# Patient Record
Sex: Male | Born: 1985 | Hispanic: No | Marital: Single | Smoking: Never smoker
Health system: Southern US, Community
[De-identification: ages and names within clinical notes are randomized; demographics above are authoritative.]

---

## 2013-04-27 ENCOUNTER — Encounter (HOSPITAL_BASED_OUTPATIENT_CLINIC_OR_DEPARTMENT_OTHER): Payer: Self-pay | Admitting: *Deleted

## 2013-04-27 ENCOUNTER — Emergency Department (HOSPITAL_BASED_OUTPATIENT_CLINIC_OR_DEPARTMENT_OTHER)
Admission: EM | Admit: 2013-04-27 | Discharge: 2013-04-27 | Disposition: A | Discharge status: Home / Self Care | Payer: Self-pay | Attending: Emergency Medicine | Admitting: Emergency Medicine

## 2013-04-27 DIAGNOSIS — Y9389 Activity, other specified: Secondary | ICD-10-CM | POA: Insufficient documentation

## 2013-04-27 DIAGNOSIS — Y990 Civilian activity done for income or pay: Secondary | ICD-10-CM | POA: Insufficient documentation

## 2013-04-27 DIAGNOSIS — Y929 Unspecified place or not applicable: Secondary | ICD-10-CM | POA: Insufficient documentation

## 2013-04-27 DIAGNOSIS — S39012A Strain of muscle, fascia and tendon of lower back, initial encounter: Secondary | ICD-10-CM

## 2013-04-27 DIAGNOSIS — X500XXA Overexertion from strenuous movement or load, initial encounter: Secondary | ICD-10-CM | POA: Insufficient documentation

## 2013-04-27 DIAGNOSIS — S335XXA Sprain of ligaments of lumbar spine, initial encounter: Secondary | ICD-10-CM | POA: Insufficient documentation

## 2013-04-27 MED ORDER — IBUPROFEN 600 MG PO TABS: 600.0000 mg | ORAL_TABLET | Freq: Four times a day (QID) | ORAL | Status: DC | PRN

## 2013-04-27 NOTE — ED Provider Notes (Signed)
CSN: [id number]     Arrival date & time 04/27/13  1315 History     First MD Initiated Contact with Patient 04/27/13 1325     Chief Complaint  Patient presents with  . Back Pain   (Consider location/radiation/quality/duration/timing/severity/associated sxs/prior Treatment) HPI Comments: Patient was lifting heavy palates at work 2 days ago prior to onset of back pain. Relief with Advil and rest as well as topical ice. Denies incontinence, numbness/tingling, and extremity weakness.  Patient is a 27 y.o. male presenting with back pain. The history is provided by the patient. No language interpreter was used.  Back Pain Location:  Lumbar spine Quality:  Aching Radiates to: down to lumbar spine. Pain severity:  Moderate Worse during: worse with movement. Onset quality:  Gradual Duration:  2 days Timing:  Intermittent Progression:  Unchanged Chronicity:  New Context: lifting heavy objects   Context: not jumping from heights   Relieved by:  NSAIDs Worsened by:  Movement and palpation Associated symptoms: no bladder incontinence, no bowel incontinence, no fever, no leg pain, no numbness, no paresthesias, no perianal numbness, no tingling and no weakness   Risk factors: no hx of cancer     History reviewed. No pertinent past medical history. History reviewed. No pertinent past surgical history. No family history on file. History  Substance Use Topics  . Smoking status: Not on file  . Smokeless tobacco: Never Used  . Alcohol Use: No    Review of Systems  Constitutional: Negative for fever.  Gastrointestinal: Negative for bowel incontinence.  Genitourinary: Negative for bladder incontinence.  Musculoskeletal: Positive for back pain.  Skin: Negative for pallor and wound.  Neurological: Negative for tingling, weakness, numbness and paresthesias.  All other systems reviewed and are negative.    Allergies  Review of patient's allergies indicates no known allergies.  Home  Medications   Current Outpatient Rx  Name  Route  Sig  Dispense  Refill  . ibuprofen (ADVIL,MOTRIN) 600 MG tablet   Oral   Take 1 tablet (600 mg total) by mouth every 6 (six) hours as needed for pain.   30 tablet   0    BP 150/101  Pulse 80  Temp(Src) 98.2 F (36.8 C) (Oral)  Resp 15  Ht 5\' 9"  (1.753 m)  Wt 203 lb (92.08 kg)  BMI 29.96 kg/m2  SpO2 99%  Physical Exam  Nursing note and vitals reviewed. Constitutional: He is oriented to person, place, and time. He appears well-developed and well-nourished. No distress.  HENT:  Head: Normocephalic and atraumatic.  Eyes: Conjunctivae and EOM are normal. No scleral icterus.  Neck: Normal range of motion.  Cardiovascular: Normal rate, regular rhythm and intact distal pulses.   DP and PT pulses 2+ bilaterally  Pulmonary/Chest: Effort normal. No respiratory distress.  Musculoskeletal:  Tenderness to palpation of the lumbar paraspinal muscles. No midline tenderness to palpation of the thoracic or lumbar spine. No bony deformities or step-offs palpated. Negative straight leg raise and cross straight leg raise. Patient ambulatory with normal gait.  Neurological: He is alert and oriented to person, place, and time.  No sensory or motor deficits appreciated. DTRs normal and symmetric. Patient moves extremities without ataxia.  Skin: Skin is warm and dry. No rash noted. He is not diaphoretic. No erythema. No pallor.  Psychiatric: He has a normal mood and affect. His behavior is normal.    ED Course   Procedures (including critical care time)  Labs Reviewed - No data to display No results  found.  1. Back strain, initial encounter    MDM  27 year old male who presents for back pain with onset 2 days ago after heavy lifting at his job. Patient with tenderness to palpation of his left and right lumbar paraspinal muscles. No midline tenderness to palpation, bony deformities, or step-offs. No red flags or signs concerning for cauda  equina. Patient ambulatory and neurovascularly intact. Appropriate for discharge with instruction for Advil, ice, and rest for symptomatic improvement. Orthopedic referral provided if no improvement in back pain after one week. Indications for ED return discussed and patient agreeable to plan.    Antony Madura, PA-C 04/27/13 1414

## 2013-04-27 NOTE — ED Notes (Signed)
Pt. Reports lifting heavy palates at his job and now having low back pain 8/10 per Pt.  Pt. Reports bending to tie his shoe is a task and painful

## 2013-04-27 NOTE — ED Provider Notes (Signed)
Medical screening examination/treatment/procedure(s) were performed by non-physician practitioner and as supervising physician I was immediately available for consultation/collaboration.   Richardean Canal, MD 04/27/13 [phone]

## 2020-05-11 ENCOUNTER — Emergency Department (HOSPITAL_COMMUNITY)
Admission: EM | Admit: 2020-05-11 | Discharge: 2020-05-11 | Disposition: A | Discharge status: Home / Self Care | Payer: Self-pay | Attending: Emergency Medicine | Admitting: Emergency Medicine

## 2020-05-11 ENCOUNTER — Encounter (HOSPITAL_COMMUNITY): Payer: Self-pay

## 2020-05-11 ENCOUNTER — Other Ambulatory Visit: Payer: Self-pay

## 2020-05-11 DIAGNOSIS — R000 Tachycardia, unspecified: Secondary | ICD-10-CM | POA: Insufficient documentation

## 2020-05-11 DIAGNOSIS — E860 Dehydration: Secondary | ICD-10-CM | POA: Insufficient documentation

## 2020-05-11 DIAGNOSIS — U071 COVID-19: Secondary | ICD-10-CM | POA: Insufficient documentation

## 2020-05-11 LAB — SARS CORONAVIRUS 2 BY RT PCR (HOSPITAL ORDER, PERFORMED IN ~~LOC~~ HOSPITAL LAB): SARS Coronavirus 2: POSITIVE — AB

## 2020-05-11 MED ORDER — BENZONATATE 100 MG PO CAPS
100.0000 mg | ORAL_CAPSULE | Freq: Three times a day (TID) | ORAL | 0 refills | Status: DC
Start: 2020-05-11 — End: 2020-05-11

## 2020-05-11 MED ORDER — PROMETHAZINE-DM 6.25-15 MG/5ML PO SYRP: 5.0000 mL | ORAL_SOLUTION | Freq: Four times a day (QID) | ORAL | 0 refills | Status: DC | PRN

## 2020-05-11 MED ORDER — BENZONATATE 100 MG PO CAPS: 100.0000 mg | ORAL_CAPSULE | Freq: Three times a day (TID) | ORAL | 0 refills | Status: DC

## 2020-05-11 MED ORDER — ONDANSETRON 4 MG PO TBDP: 4.0000 mg | ORAL_TABLET | Freq: Three times a day (TID) | ORAL | 0 refills | Status: DC | PRN

## 2020-05-11 MED ORDER — PROMETHAZINE-DM 6.25-15 MG/5ML PO SYRP: 5.0000 mL | ORAL_SOLUTION | Freq: Four times a day (QID) | ORAL | 0 refills | Status: AC | PRN

## 2020-05-11 MED ORDER — IBUPROFEN 200 MG PO TABS
400.0000 mg | ORAL_TABLET | Freq: Once | ORAL | Status: AC
  Administered 2020-05-11: 400 mg via ORAL
  Filled 2020-05-11: qty 2

## 2020-05-11 MED ORDER — ONDANSETRON 4 MG PO TBDP: 4.0000 mg | ORAL_TABLET | Freq: Three times a day (TID) | ORAL | 0 refills | Status: AC | PRN

## 2020-05-11 MED ORDER — BENZONATATE 100 MG PO CAPS: 100.0000 mg | ORAL_CAPSULE | Freq: Three times a day (TID) | ORAL | 0 refills | Status: AC

## 2020-05-11 MED ORDER — ONDANSETRON 8 MG PO TBDP
8.0000 mg | ORAL_TABLET | Freq: Once | ORAL | Status: AC
  Administered 2020-05-11: 8 mg via ORAL
  Filled 2020-05-11: qty 1

## 2020-05-11 MED ORDER — ACETAMINOPHEN 500 MG PO TABS
1000.0000 mg | ORAL_TABLET | Freq: Once | ORAL | Status: AC
  Administered 2020-05-11: 1000 mg via ORAL
  Filled 2020-05-11: qty 2

## 2020-05-11 NOTE — Discharge Instructions (Addendum)
I strong suspicion that you have a infection of COVID-19.  I provided you with Zofran which is nausea medication may also help with the diarrhea you are having.  Please drink plenty of water and may also use some of the electrolyte solutions that you and I discussed.  Please use Tylenol 1000 mg every 6 hours you may alternate with ibuprofen 400-600 mg as we discussed.  Please follow CDC guidelines and isolate at home.  Wear a mask at all times and wash your hands thoroughly.  May return to the emergency department for any new or concerning symptoms.  Or worsening shortness of breath.  Your COVID test is pending; you should expect results in 2-3 days. You can access your results on your MyChart--if you test positive you should receive a phone call.  In the meantime follow CDC guidelines and quarantine, wear a mask, wash hands often.   Please take over the counter vitamin D 2000-4000 units per day. I also recommend zinc 50 mg per day for the next two weeks.   Please return to ED if you feel have difficulty breathing or have emergent, new or concerning symptoms.  Patients who have symptoms consistent with COVID-19 should self isolated for: At least 3 days (72 hours) have passed since recovery, defined as resolution of fever without the use of fever reducing medications and improvement in respiratory symptoms (e.g., cough, shortness of breath), and At least 7 days have passed since symptoms first appeared.       Person Under Monitoring Name: Brian Mack  Location: [address] [place] [address]   Infection Prevention Recommendations for Individuals Confirmed to have, or Being Evaluated for, 2019 Novel Coronavirus (COVID-19) Infection Who Receive Care at Home  Individuals who are confirmed to have, or are being evaluated for, COVID-19 should follow the prevention steps below until a healthcare provider or local or state health department says they can return to  normal activities.  Stay home except to get medical care You should restrict activities outside your home, except for getting medical care. Do not go to work, school, or public areas, and do not use public transportation or taxis.  Call ahead before visiting your doctor Before your medical appointment, call the healthcare provider and tell them that you have, or are being evaluated for, COVID-19 infection. This will help the healthcare provider's office take steps to keep other people from getting infected. Ask your healthcare provider to call the local or state health department.  Monitor your symptoms Seek prompt medical attention if your illness is worsening (e.g., difficulty breathing). Before going to your medical appointment, call the healthcare provider and tell them that you have, or are being evaluated for, COVID-19 infection. Ask your healthcare provider to call the local or state health department.  Wear a facemask You should wear a facemask that covers your nose and mouth when you are in the same room with other people and when you visit a healthcare provider. People who live with or visit you should also wear a facemask while they are in the same room with you.  Separate yourself from other people in your home As much as possible, you should stay in a different room from other people in your home. Also, you should use a separate bathroom, if available.  Avoid sharing household items You should not share dishes, drinking glasses, cups, eating utensils, towels, bedding, or other items with other people in your home. After using these items, you  should wash them thoroughly with soap and water.  Cover your coughs and sneezes Cover your mouth and nose with a tissue when you cough or sneeze, or you can cough or sneeze into your sleeve. Throw used tissues in a lined trash can, and immediately wash your hands with soap and water for at least 20 seconds or use an alcohol-based  hand rub.  Wash your [employer] your hands often and thoroughly with soap and water for at least 20 seconds. You can use an alcohol-based hand sanitizer if soap and water are not available and if your hands are not visibly dirty. Avoid touching your eyes, nose, and mouth with unwashed hands.   Prevention Steps for Caregivers and Household Members of Individuals Confirmed to have, or Being Evaluated for, COVID-19 Infection Being Cared for in the Home  If you live with, or provide care at home for, a person confirmed to have, or being evaluated for, COVID-19 infection please follow these guidelines to prevent infection:  Follow healthcare provider's instructions Make sure that you understand and can help the patient follow any healthcare provider instructions for all care.  Provide for the patient's basic needs You should help the patient with basic needs in the home and provide support for getting groceries, prescriptions, and other personal needs.  Monitor the patient's symptoms If they are getting sicker, call his or her medical provider and tell them that the patient has, or is being evaluated for, COVID-19 infection. This will help the healthcare provider's office take steps to keep other people from getting infected. Ask the healthcare provider to call the local or state health department.  Limit the number of people who have contact with the patient If possible, have only one caregiver for the patient. Other household members should stay in another home or place of residence. If this is not possible, they should stay in another room, or be separated from the patient as much as possible. Use a separate bathroom, if available. Restrict visitors who do not have an essential need to be in the home.  Keep older adults, very young children, and other sick people away from the patient Keep older adults, very young children, and those who have compromised immune systems or chronic  health conditions away from the patient. This includes people with chronic heart, lung, or kidney conditions, diabetes, and cancer.  Ensure good ventilation Make sure that shared spaces in the home have good air flow, such as from an air conditioner or an opened window, weather permitting.  Wash your hands often Wash your hands often and thoroughly with soap and water for at least 20 seconds. You can use an alcohol based hand sanitizer if soap and water are not available and if your hands are not visibly dirty. Avoid touching your eyes, nose, and mouth with unwashed hands. Use disposable paper towels to dry your hands. If not available, use dedicated cloth towels and replace them when they become wet.  Wear a facemask and gloves Wear a disposable facemask at all times in the room and gloves when you touch or have contact with the patient's blood, body fluids, and/or secretions or excretions, such as sweat, saliva, sputum, nasal mucus, vomit, urine, or feces.  Ensure the mask fits over your nose and mouth tightly, and do not touch it during use. Throw out disposable facemasks and gloves after using them. Do not reuse. Wash your hands immediately after removing your facemask and gloves. If your personal clothing becomes contaminated, carefully  remove clothing and launder. Wash your hands after handling contaminated clothing. Place all used disposable facemasks, gloves, and other waste in a lined container before disposing them with other household waste. Remove gloves and wash your hands immediately after handling these items.  Do not share dishes, glasses, or other household items with the patient Avoid sharing household items. You should not share dishes, drinking glasses, cups, eating utensils, towels, bedding, or other items with a patient who is confirmed to have, or being evaluated for, COVID-19 infection. After the person uses these items, you should wash them thoroughly with soap and  water.  Wash laundry thoroughly Immediately remove and wash clothes or bedding that have blood, body fluids, and/or secretions or excretions, such as sweat, saliva, sputum, nasal mucus, vomit, urine, or feces, on them. Wear gloves when handling laundry from the patient. Read and follow directions on labels of laundry or clothing items and detergent. In general, wash and dry with the warmest temperatures recommended on the label.  Clean all areas the individual has used often Clean all touchable surfaces, such as counters, tabletops, doorknobs, bathroom fixtures, toilets, phones, keyboards, tablets, and bedside tables, every day. Also, clean any surfaces that may have blood, body fluids, and/or secretions or excretions on them. Wear gloves when cleaning surfaces the patient has come in contact with. Use a diluted bleach solution (e.g., dilute bleach with 1 part bleach and 10 parts water) or a household disinfectant with a label that says EPA-registered for coronaviruses. To make a bleach solution at home, add 1 tablespoon of bleach to 1 quart (4 cups) of water. For a larger supply, add  cup of bleach to 1 gallon (16 cups) of water. Read labels of cleaning products and follow recommendations provided on product labels. Labels contain instructions for safe and effective use of the cleaning product including precautions you should take when applying the product, such as wearing gloves or eye protection and making sure you have good ventilation during use of the product. Remove gloves and wash hands immediately after cleaning.  Monitor yourself for signs and symptoms of illness Caregivers and household members are considered close contacts, should monitor their health, and will be asked to limit movement outside of the home to the extent possible. Follow the monitoring steps for close contacts listed on the symptom monitoring form.   ? If you have additional questions, contact your local health  department or call the epidemiologist on call at [phone] (available 24/7). ? This guidance is subject to change. For the most up-to-date guidance from [facility], please refer to their website: [email or web address]

## 2020-05-11 NOTE — ED Notes (Signed)
Patient ambulated with this RN, Spo2 remained at 93%

## 2020-05-11 NOTE — ED Provider Notes (Signed)
[facility]-EMERGENCY DEPT Provider Note   CSN: [id number] Arrival date & time: 05/11/20  1436     History Chief Complaint  Patient presents with  . COVID+  . Emesis  . Diarrhea    Brian Mack is a 34 y.o. male.  HPI  Patient is a 34 year old male with no pertinent past medical history presented today with symptoms for 4 days that consist of congestion, cough, nausea, 2 episodes of nonbloody diarrhea.  Chills but denies fever.  He has no known Covid exposure however he did a home Covid test that was positive.  He has not been seen medically for the symptoms yet.  He states he feels no shortness of breath denies any chest pain or abdominal pain.  Denies any vomiting.  Patient states he has not taken anything for his symptoms prior to arrival.  He has been eating and drinking normally.  He states he has had no calf pain or shortness of breath.  No history of VTE.  He is on no blood thinners.  Denies any medical issues.  He states that he is looking for symptomatic treatment at this time has not interested in having x-rays or blood work done    History reviewed. No pertinent past medical history.  There are no problems to display for this patient.   History reviewed. No pertinent surgical history.     History reviewed. No pertinent family history.  Social History   Tobacco Use  . Smoking status: Not on file  . Smokeless tobacco: Never Used  Substance Use Topics  . Alcohol use: No  . Drug use: Not on file    Home Medications Prior to Admission medications   Medication Sig Start Date End Date Taking? Authorizing Provider  benzonatate (TESSALON) 100 MG capsule Take 1 capsule (100 mg total) by mouth every 8 (eight) hours. 05/11/20   Gailen Shelter, PA  ibuprofen (ADVIL,MOTRIN) 600 MG tablet Take 1 tablet (600 mg total) by mouth every 6 (six) hours as needed for pain. 04/27/13   Antony Madura, PA-C  ondansetron (ZOFRAN ODT) 4 MG disintegrating  tablet Take 1 tablet (4 mg total) by mouth every 8 (eight) hours as needed for nausea or vomiting. 05/11/20   Gailen Shelter, PA  promethazine-dextromethorphan (PROMETHAZINE-DM) 6.25-15 MG/5ML syrup Take 5 mLs by mouth 4 (four) times daily as needed for cough. 05/11/20   Gailen Shelter, PA    Allergies    Patient has no known allergies.  Review of Systems   Review of Systems  Constitutional: Positive for chills and fatigue. Negative for fever.  HENT: Positive for congestion.   Eyes: Negative for pain.  Respiratory: Positive for cough. Negative for shortness of breath.   Cardiovascular: Negative for chest pain and leg swelling.  Gastrointestinal: Positive for diarrhea and nausea. Negative for abdominal pain and vomiting.  Genitourinary: Negative for dysuria.  Musculoskeletal: Positive for myalgias.  Skin: Negative for rash.  Neurological: Negative for dizziness and headaches.    Physical Exam Updated Vital Signs BP 122/85   Pulse 99   Temp 99.1 F (37.3 C) (Oral)   Resp 18   Ht 5' 9.5" (1.765 m)   Wt 117.9 kg   SpO2 98%   BMI 37.84 kg/m   Physical Exam Vitals and nursing note reviewed.  Constitutional:      General: He is not in acute distress. HENT:     Head: Normocephalic and atraumatic.     Nose: Nose normal.  Mouth/Throat:     Mouth: Mucous membranes are moist.  Eyes:     General: No scleral icterus. Cardiovascular:     Rate and Rhythm: Regular rhythm. Tachycardia present.     Pulses: Normal pulses.     Heart sounds: Normal heart sounds.     Comments: Heart rate 106-108 Pulmonary:     Effort: Pulmonary effort is normal. No respiratory distress.     Breath sounds: No wheezing.     Comments: Coarse lung sounds, no crackles. No tachypnea.  No increased work of breathing.  Speaking full sentences. Abdominal:     Palpations: Abdomen is soft.     Tenderness: There is no abdominal tenderness. There is no guarding or rebound.  Musculoskeletal:     Cervical  back: Normal range of motion.     Right lower leg: No edema.     Left lower leg: No edema.     Comments: No calf tenderness bilaterally.  No swelling or edema.  Skin:    General: Skin is warm and dry.     Capillary Refill: Capillary refill takes less than 2 seconds.  Neurological:     Mental Status: He is alert. Mental status is at baseline.  Psychiatric:        Mood and Affect: Mood normal.        Behavior: Behavior normal.     ED Results / Procedures / Treatments   Labs (all labs ordered are listed, but only abnormal results are displayed) Labs Reviewed  SARS CORONAVIRUS 2 BY RT PCR (HOSPITAL ORDER, PERFORMED IN [facility]) - Abnormal; Notable for the following components:      Result Value   SARS Coronavirus 2 POSITIVE (*)    All other components within normal limits    EKG None  Radiology No results found.  Procedures Procedures (including critical care time)  Medications Ordered in ED Medications  ondansetron (ZOFRAN-ODT) disintegrating tablet 8 mg (8 mg Oral Given 05/11/20 1558)  acetaminophen (TYLENOL) tablet 1,000 mg (1,000 mg Oral Given 05/11/20 1558)  ibuprofen (ADVIL) tablet 400 mg (400 mg Oral Given 05/11/20 1558)    ED Course  I have reviewed the triage vital signs and the nursing notes.  Pertinent labs & imaging results that were available during my care of the patient were reviewed by me and considered in my medical decision making (see chart for details).    MDM Rules/Calculators/A&P                          Patient is a 34 year old male with no pertinent medical history presented today with symptoms consistent with COVID-19.  He has not had a formal test.  He did have a Covid home test which was positive. He denies any shortness of breath or chest pain.  Rest of his symptoms are consistent with COVID-19 infection.  He was ambulated on pulse ox with no desaturation.  He is tolerating p.o. has had some Zofran and Tylenol and  ibuprofen.  Physical exam is notable for coarse lung sounds but no other acute abnormalities.  He is aerating well mildly tachycardic with heart rate of 108.  Will provide patient with fluid challenge and medicines as noted above.  Will reassess.  I reassessed patient after 40 minutes.  His vital signs have improved and he is feeling significantly improved. He continues to decline medical work-up at this time.  I gave patient strict return precautions and discharged with conservative  therapy with Zofran, cough medicines and Tylenol and ibuprofen recommendations.   Brian Mack was evaluated in Emergency Department on 05/11/2020 for the symptoms described in the history of present illness. He was evaluated in the context of the global COVID-19 pandemic, which necessitated consideration that the patient might be at risk for infection with the SARS-CoV-2 virus that causes COVID-19. Institutional protocols and algorithms that pertain to the evaluation of patients at risk for COVID-19 are in a state of rapid change based on information released by regulatory bodies including the CDC and federal and state organizations. These policies and algorithms were followed during the patient's care in the ED.  Final Clinical Impression(s) / ED Diagnoses Final diagnoses:  COVID-19  Dehydration    Rx / DC Orders ED Discharge Orders         Ordered    ondansetron (ZOFRAN ODT) 4 MG disintegrating tablet  Every 8 hours PRN,   Status:  Discontinued        05/11/20 1727    promethazine-dextromethorphan (PROMETHAZINE-DM) 6.25-15 MG/5ML syrup  4 times daily PRN,   Status:  Discontinued        05/11/20 1727    benzonatate (TESSALON) 100 MG capsule  Every 8 hours,   Status:  Discontinued        05/11/20 1727    benzonatate (TESSALON) 100 MG capsule  Every 8 hours,   Status:  Discontinued        05/11/20 1736    ondansetron (ZOFRAN ODT) 4 MG disintegrating tablet  Every 8 hours PRN,   Status:  Discontinued         05/11/20 1736    promethazine-dextromethorphan (PROMETHAZINE-DM) 6.25-15 MG/5ML syrup  4 times daily PRN,   Status:  Discontinued        05/11/20 1736    benzonatate (TESSALON) 100 MG capsule  Every 8 hours        05/11/20 1743    ondansetron (ZOFRAN ODT) 4 MG disintegrating tablet  Every 8 hours PRN        05/11/20 1743    promethazine-dextromethorphan (PROMETHAZINE-DM) 6.25-15 MG/5ML syrup  4 times daily PRN        05/11/20 1743           Gailen Shelter, [place] 05/11/20 1748    Gerhard Munch, MD 05/11/20 2244

## 2020-05-11 NOTE — ED Triage Notes (Signed)
Pt BIB EMS from home. Pt COVID+ since 8/26. Pt c/o N/V/D. Pt denies SOB.

## 2020-05-11 NOTE — ED Notes (Signed)
An After Visit Summary was printed and given to the patient. Discharge instructions given and no further questions at this time.  

## 2020-05-12 ENCOUNTER — Telehealth: Payer: Self-pay | Admitting: Adult Health

## 2020-05-12 NOTE — Telephone Encounter (Signed)
Called and LMOM regarding monoclonal antibody treatment for COVID 19 given to those who are at risk for complications and/or hospitalization of the virus.  Patient meets criteria based on: bmi greater than 25  Call back number given: [phone]  My chart message: unable to send  Brian Anes, NP

## 2020-05-15 ENCOUNTER — Emergency Department (HOSPITAL_COMMUNITY): Payer: HRSA Program

## 2020-05-15 ENCOUNTER — Other Ambulatory Visit: Payer: Self-pay

## 2020-05-15 ENCOUNTER — Encounter (HOSPITAL_COMMUNITY): Payer: Self-pay | Admitting: Emergency Medicine

## 2020-05-15 ENCOUNTER — Inpatient Hospital Stay (HOSPITAL_COMMUNITY)
Admission: EM | Admit: 2020-05-15 | Discharge: 2020-07-14 | DRG: 004 | Disposition: E | Payer: HRSA Program | Attending: Internal Medicine | Admitting: Internal Medicine

## 2020-05-15 DIAGNOSIS — Y95 Nosocomial condition: Secondary | ICD-10-CM | POA: Diagnosis not present

## 2020-05-15 DIAGNOSIS — E876 Hypokalemia: Secondary | ICD-10-CM | POA: Diagnosis present

## 2020-05-15 DIAGNOSIS — Z930 Tracheostomy status: Secondary | ICD-10-CM

## 2020-05-15 DIAGNOSIS — J9602 Acute respiratory failure with hypercapnia: Secondary | ICD-10-CM | POA: Diagnosis not present

## 2020-05-15 DIAGNOSIS — K2970 Gastritis, unspecified, without bleeding: Secondary | ICD-10-CM | POA: Diagnosis present

## 2020-05-15 DIAGNOSIS — R739 Hyperglycemia, unspecified: Secondary | ICD-10-CM | POA: Diagnosis not present

## 2020-05-15 DIAGNOSIS — E870 Hyperosmolality and hypernatremia: Secondary | ICD-10-CM | POA: Diagnosis not present

## 2020-05-15 DIAGNOSIS — R197 Diarrhea, unspecified: Secondary | ICD-10-CM | POA: Diagnosis present

## 2020-05-15 DIAGNOSIS — Z9911 Dependence on respirator [ventilator] status: Secondary | ICD-10-CM | POA: Diagnosis not present

## 2020-05-15 DIAGNOSIS — E871 Hypo-osmolality and hyponatremia: Secondary | ICD-10-CM | POA: Diagnosis present

## 2020-05-15 DIAGNOSIS — E860 Dehydration: Secondary | ICD-10-CM | POA: Diagnosis present

## 2020-05-15 DIAGNOSIS — J80 Acute respiratory distress syndrome: Secondary | ICD-10-CM | POA: Diagnosis not present

## 2020-05-15 DIAGNOSIS — R570 Cardiogenic shock: Secondary | ICD-10-CM | POA: Diagnosis not present

## 2020-05-15 DIAGNOSIS — E781 Pure hyperglyceridemia: Secondary | ICD-10-CM | POA: Diagnosis present

## 2020-05-15 DIAGNOSIS — N179 Acute kidney failure, unspecified: Secondary | ICD-10-CM | POA: Diagnosis not present

## 2020-05-15 DIAGNOSIS — Z66 Do not resuscitate: Secondary | ICD-10-CM | POA: Diagnosis not present

## 2020-05-15 DIAGNOSIS — A419 Sepsis, unspecified organism: Secondary | ICD-10-CM | POA: Diagnosis not present

## 2020-05-15 DIAGNOSIS — Z978 Presence of other specified devices: Secondary | ICD-10-CM

## 2020-05-15 DIAGNOSIS — K219 Gastro-esophageal reflux disease without esophagitis: Secondary | ICD-10-CM | POA: Diagnosis present

## 2020-05-15 DIAGNOSIS — I10 Essential (primary) hypertension: Secondary | ICD-10-CM | POA: Diagnosis present

## 2020-05-15 DIAGNOSIS — J189 Pneumonia, unspecified organism: Secondary | ICD-10-CM | POA: Diagnosis not present

## 2020-05-15 DIAGNOSIS — J9601 Acute respiratory failure with hypoxia: Secondary | ICD-10-CM | POA: Diagnosis not present

## 2020-05-15 DIAGNOSIS — D72828 Other elevated white blood cell count: Secondary | ICD-10-CM | POA: Diagnosis not present

## 2020-05-15 DIAGNOSIS — R0902 Hypoxemia: Secondary | ICD-10-CM

## 2020-05-15 DIAGNOSIS — E861 Hypovolemia: Secondary | ICD-10-CM | POA: Diagnosis present

## 2020-05-15 DIAGNOSIS — U071 COVID-19: Secondary | ICD-10-CM | POA: Diagnosis present

## 2020-05-15 DIAGNOSIS — J9600 Acute respiratory failure, unspecified whether with hypoxia or hypercapnia: Secondary | ICD-10-CM

## 2020-05-15 DIAGNOSIS — Z6837 Body mass index (BMI) 37.0-37.9, adult: Secondary | ICD-10-CM | POA: Diagnosis not present

## 2020-05-15 DIAGNOSIS — E669 Obesity, unspecified: Secondary | ICD-10-CM | POA: Diagnosis present

## 2020-05-15 DIAGNOSIS — G9340 Encephalopathy, unspecified: Secondary | ICD-10-CM | POA: Diagnosis not present

## 2020-05-15 DIAGNOSIS — R109 Unspecified abdominal pain: Secondary | ICD-10-CM

## 2020-05-15 DIAGNOSIS — T380X5A Adverse effect of glucocorticoids and synthetic analogues, initial encounter: Secondary | ICD-10-CM | POA: Diagnosis not present

## 2020-05-15 DIAGNOSIS — R7303 Prediabetes: Secondary | ICD-10-CM | POA: Diagnosis present

## 2020-05-15 DIAGNOSIS — E874 Mixed disorder of acid-base balance: Secondary | ICD-10-CM | POA: Diagnosis present

## 2020-05-15 DIAGNOSIS — E6609 Other obesity due to excess calories: Secondary | ICD-10-CM | POA: Diagnosis not present

## 2020-05-15 DIAGNOSIS — Z283 Underimmunization status: Secondary | ICD-10-CM | POA: Diagnosis not present

## 2020-05-15 DIAGNOSIS — Z515 Encounter for palliative care: Secondary | ICD-10-CM

## 2020-05-15 DIAGNOSIS — J9690 Respiratory failure, unspecified, unspecified whether with hypoxia or hypercapnia: Secondary | ICD-10-CM

## 2020-05-15 DIAGNOSIS — N132 Hydronephrosis with renal and ureteral calculous obstruction: Secondary | ICD-10-CM | POA: Diagnosis present

## 2020-05-15 DIAGNOSIS — J1282 Pneumonia due to coronavirus disease 2019: Secondary | ICD-10-CM

## 2020-05-15 DIAGNOSIS — R1084 Generalized abdominal pain: Secondary | ICD-10-CM | POA: Diagnosis not present

## 2020-05-15 DIAGNOSIS — N23 Unspecified renal colic: Secondary | ICD-10-CM | POA: Diagnosis not present

## 2020-05-15 DIAGNOSIS — R6521 Severe sepsis with septic shock: Secondary | ICD-10-CM | POA: Diagnosis not present

## 2020-05-15 DIAGNOSIS — R680 Hypothermia, not associated with low environmental temperature: Secondary | ICD-10-CM | POA: Diagnosis not present

## 2020-05-15 DIAGNOSIS — E875 Hyperkalemia: Secondary | ICD-10-CM | POA: Diagnosis not present

## 2020-05-15 DIAGNOSIS — R001 Bradycardia, unspecified: Secondary | ICD-10-CM | POA: Diagnosis not present

## 2020-05-15 DIAGNOSIS — T426X5A Adverse effect of other antiepileptic and sedative-hypnotic drugs, initial encounter: Secondary | ICD-10-CM | POA: Diagnosis not present

## 2020-05-15 DIAGNOSIS — Z4659 Encounter for fitting and adjustment of other gastrointestinal appliance and device: Secondary | ICD-10-CM

## 2020-05-15 LAB — CBC WITH DIFFERENTIAL/PLATELET
Abs Immature Granulocytes: 0.1 10*3/uL — ABNORMAL HIGH (ref 0.00–0.07)
Basophils Absolute: 0 10*3/uL (ref 0.0–0.1)
Basophils Relative: 0 %
Eosinophils Absolute: 0 10*3/uL (ref 0.0–0.5)
Eosinophils Relative: 0 %
HCT: 52.5 % — ABNORMAL HIGH (ref 39.0–52.0)
Hemoglobin: 17.6 g/dL — ABNORMAL HIGH (ref 13.0–17.0)
Immature Granulocytes: 1 %
Lymphocytes Relative: 7 %
Lymphs Abs: 1 10*3/uL (ref 0.7–4.0)
MCH: 27.1 pg (ref 26.0–34.0)
MCHC: 33.5 g/dL (ref 30.0–36.0)
MCV: 80.8 fL (ref 80.0–100.0)
Monocytes Absolute: 1.4 10*3/uL — ABNORMAL HIGH (ref 0.1–1.0)
Monocytes Relative: 10 %
Neutro Abs: 11.9 10*3/uL — ABNORMAL HIGH (ref 1.7–7.7)
Neutrophils Relative %: 82 %
Platelets: 226 10*3/uL (ref 150–400)
RBC: 6.5 MIL/uL — ABNORMAL HIGH (ref 4.22–5.81)
RDW: 13 % (ref 11.5–15.5)
WBC: 14.4 10*3/uL — ABNORMAL HIGH (ref 4.0–10.5)
nRBC: 0 % (ref 0.0–0.2)

## 2020-05-15 LAB — FERRITIN: Ferritin: 1488 ng/mL — ABNORMAL HIGH (ref 24–336)

## 2020-05-15 LAB — COMPREHENSIVE METABOLIC PANEL
ALT: 39 U/L (ref 0–44)
AST: 140 U/L — ABNORMAL HIGH (ref 15–41)
Albumin: 3.1 g/dL — ABNORMAL LOW (ref 3.5–5.0)
Alkaline Phosphatase: 75 U/L (ref 38–126)
Anion gap: 19 — ABNORMAL HIGH (ref 5–15)
BUN: 23 mg/dL — ABNORMAL HIGH (ref 6–20)
CO2: 16 mmol/L — ABNORMAL LOW (ref 22–32)
Calcium: 8.4 mg/dL — ABNORMAL LOW (ref 8.9–10.3)
Chloride: 96 mmol/L — ABNORMAL LOW (ref 98–111)
Creatinine, Ser: 1.79 mg/dL — ABNORMAL HIGH (ref 0.61–1.24)
GFR calc Af Amer: 57 mL/min — ABNORMAL LOW (ref >60)
GFR calc non Af Amer: 49 mL/min — ABNORMAL LOW (ref >60)
Glucose, Bld: 137 mg/dL — ABNORMAL HIGH (ref 70–99)
Potassium: 3.7 mmol/L (ref 3.5–5.1)
Sodium: 131 mmol/L — ABNORMAL LOW (ref 135–145)
Total Bilirubin: 0.9 mg/dL (ref 0.3–1.2)
Total Protein: 7.6 g/dL (ref 6.5–8.1)

## 2020-05-15 LAB — TRIGLYCERIDES: Triglycerides: 229 mg/dL — ABNORMAL HIGH (ref <150)

## 2020-05-15 LAB — FIBRINOGEN: Fibrinogen: 708 mg/dL — ABNORMAL HIGH (ref 210–475)

## 2020-05-15 LAB — LACTATE DEHYDROGENASE: LDH: 1214 U/L — ABNORMAL HIGH (ref 98–192)

## 2020-05-15 LAB — LACTIC ACID, PLASMA
Lactic Acid, Venous: 2.4 mmol/L (ref 0.5–1.9)
Lactic Acid, Venous: 1.5 mmol/L (ref 0.5–1.9)
Lactic Acid, Venous: 1.7 mmol/L (ref 0.5–1.9)

## 2020-05-15 LAB — C-REACTIVE PROTEIN: CRP: 10.4 mg/dL — ABNORMAL HIGH (ref <1.0)

## 2020-05-15 LAB — PROCALCITONIN: Procalcitonin: 1.15 ng/mL

## 2020-05-15 LAB — D-DIMER, QUANTITATIVE: D-Dimer, Quant: 2.38 ug/mL-FEU — ABNORMAL HIGH (ref 0.00–0.50)

## 2020-05-15 MED ORDER — LACTATED RINGERS IV BOLUS
1000.0000 mL | Freq: Once | INTRAVENOUS | Status: AC
  Administered 2020-05-15: 1000 mL via INTRAVENOUS

## 2020-05-15 MED ORDER — ACETAMINOPHEN 325 MG PO TABS
650.0000 mg | ORAL_TABLET | Freq: Once | ORAL | Status: AC
  Administered 2020-05-15: 650 mg via ORAL
  Filled 2020-05-15: qty 2

## 2020-05-15 MED ORDER — ALBUTEROL SULFATE HFA 108 (90 BASE) MCG/ACT IN AERS
2.0000 | INHALATION_SPRAY | RESPIRATORY_TRACT | Status: DC | PRN
  Administered 2020-05-16: 2 via RESPIRATORY_TRACT
  Filled 2020-05-15: qty 6.7

## 2020-05-15 MED ORDER — SODIUM CHLORIDE 0.9% FLUSH
3.0000 mL | Freq: Two times a day (BID) | INTRAVENOUS | Status: DC
  Administered 2020-05-16 – 2020-06-04 (×31): 3 mL via INTRAVENOUS

## 2020-05-15 MED ORDER — TOCILIZUMAB 400 MG/20ML IV SOLN: 8.0000 mg/kg | Freq: Once | INTRAVENOUS | Status: DC

## 2020-05-15 MED ORDER — SODIUM CHLORIDE 0.9 % IV SOLN
100.0000 mg | Freq: Every day | INTRAVENOUS | Status: DC
  Administered 2020-05-16 – 2020-05-21 (×6): 100 mg via INTRAVENOUS
  Filled 2020-05-15 (×8): qty 20

## 2020-05-15 MED ORDER — SODIUM CHLORIDE 0.9 % IV SOLN
2.0000 g | INTRAVENOUS | Status: AC
  Administered 2020-05-15 – 2020-05-19 (×5): 2 g via INTRAVENOUS
  Filled 2020-05-15 (×5): qty 20

## 2020-05-15 MED ORDER — BARICITINIB 2 MG PO TABS
2.0000 mg | ORAL_TABLET | Freq: Every day | ORAL | Status: DC
  Administered 2020-05-15 – 2020-05-16 (×2): 2 mg via ORAL
  Filled 2020-05-15 (×2): qty 1

## 2020-05-15 MED ORDER — SODIUM CHLORIDE 0.9% FLUSH: 3.0000 mL | INTRAVENOUS | Status: DC | PRN

## 2020-05-15 MED ORDER — ACETAMINOPHEN 325 MG PO TABS
650.0000 mg | ORAL_TABLET | Freq: Four times a day (QID) | ORAL | Status: DC | PRN
  Administered 2020-05-19 (×2): 650 mg via ORAL
  Filled 2020-05-15 (×4): qty 2

## 2020-05-15 MED ORDER — DEXAMETHASONE SODIUM PHOSPHATE 10 MG/ML IJ SOLN
6.0000 mg | Freq: Once | INTRAMUSCULAR | Status: AC
  Administered 2020-05-15: 6 mg via INTRAVENOUS
  Filled 2020-05-15: qty 1

## 2020-05-15 MED ORDER — GUAIFENESIN-DM 100-10 MG/5ML PO SYRP
10.0000 mL | ORAL_SOLUTION | ORAL | Status: DC | PRN
  Administered 2020-05-16 – 2020-05-21 (×6): 10 mL via ORAL
  Filled 2020-05-15 (×9): qty 10

## 2020-05-15 MED ORDER — SODIUM CHLORIDE 0.9 % IV SOLN
200.0000 mg | Freq: Once | INTRAVENOUS | Status: AC
  Administered 2020-05-15: 200 mg via INTRAVENOUS
  Filled 2020-05-15: qty 40

## 2020-05-15 MED ORDER — ONDANSETRON HCL 4 MG/2ML IJ SOLN
4.0000 mg | Freq: Four times a day (QID) | INTRAMUSCULAR | Status: DC | PRN
  Administered 2020-05-18 – 2020-05-23 (×3): 4 mg via INTRAVENOUS
  Filled 2020-05-15 (×3): qty 2

## 2020-05-15 MED ORDER — SODIUM CHLORIDE 0.9 % IV SOLN
250.0000 mL | INTRAVENOUS | Status: DC | PRN
  Administered 2020-05-24: 250 mL via INTRAVENOUS

## 2020-05-15 MED ORDER — METHYLPREDNISOLONE SODIUM SUCC 125 MG IJ SOLR
0.5000 mg/kg | Freq: Two times a day (BID) | INTRAMUSCULAR | Status: AC
  Administered 2020-05-15 – 2020-05-24 (×20): 58.75 mg via INTRAVENOUS
  Filled 2020-05-15 (×20): qty 2

## 2020-05-15 MED ORDER — SODIUM CHLORIDE 0.9 % IV SOLN
500.0000 mg | INTRAVENOUS | Status: AC
  Administered 2020-05-15 – 2020-05-19 (×5): 500 mg via INTRAVENOUS
  Filled 2020-05-15 (×5): qty 500

## 2020-05-15 MED ORDER — OXYCODONE HCL 5 MG PO TABS
5.0000 mg | ORAL_TABLET | ORAL | Status: DC | PRN
  Administered 2020-05-21 – 2020-05-22 (×2): 5 mg via ORAL
  Filled 2020-05-15 (×2): qty 1

## 2020-05-15 MED ORDER — HYDROCOD POLST-CPM POLST ER 10-8 MG/5ML PO SUER
5.0000 mL | Freq: Two times a day (BID) | ORAL | Status: DC | PRN
  Administered 2020-05-16 – 2020-05-18 (×3): 5 mL via ORAL
  Filled 2020-05-15 (×3): qty 5

## 2020-05-15 MED ORDER — ONDANSETRON HCL 4 MG PO TABS
4.0000 mg | ORAL_TABLET | Freq: Four times a day (QID) | ORAL | Status: DC | PRN
  Filled 2020-05-15: qty 1

## 2020-05-15 MED ORDER — ENOXAPARIN SODIUM 60 MG/0.6ML ~~LOC~~ SOLN
60.0000 mg | SUBCUTANEOUS | Status: DC
  Administered 2020-05-15 – 2020-05-26 (×12): 60 mg via SUBCUTANEOUS
  Filled 2020-05-15 (×12): qty 0.6

## 2020-05-15 NOTE — ED Notes (Signed)
Attempted to prone patient states he cant lay on his stomach because it hurts his stomach

## 2020-05-15 NOTE — ED Notes (Signed)
RT called to initiate HFNC per MD Ophelia Charter.

## 2020-05-15 NOTE — Consult Note (Signed)
NAME:  Brian Mack, MRN:  [id number], DOB:  16-Feb-1986, LOS: 0 ADMISSION DATE:  05/29/2020, CONSULTATION DATE:  May 29, 2020 REFERRING MD:  Ophelia Charter, CHIEF COMPLAINT:  SOB   Brief History   34 year old man with no PMH presenting with severe COVID pneumonia.  History of present illness   34 year old man with no PMH presenting with diarrhea, SOB.  Initial COVID + 8/26.  In ER, tachypneic, hypoxemic, started on HFNC, remained tachypneic prompting ICU eval.  Unvaccinated, nonsmoker, nondrinker.  Past Medical History  None  Significant Hospital Events   9/2 admitted  Consults:  N/A  Procedures:  N/A  Significant Diagnostic Tests:  CXR pneumonia  Micro Data:  COVID +  Antimicrobials:  Ceftriaxone 9/2>> Azithromycin 9/2>>   Interim history/subjective:  Consulted  Objective   Blood pressure (!) 138/95, pulse 95, temperature (!) 101.4 F (38.6 C), temperature source Oral, resp. rate (!) 30, SpO2 90 %.    FiO2 (%):  [100 %] 100 %  No intake or output data in the 24 hours ending May 29, 2020 1546 There were no vitals filed for this visit.  Examination: General: ill appearing man on HFNC HENT: malampatti 4, MMM Lungs: Mild tachypnea, occasional accessory muscle use,  Cardiovascular: RRR, ext warm Abdomen: soft, +BS Extremities: no edema Neuro: moves all 4 ext to command Skin: no rashes  Mild AKI Acidosis from AKI and diarrhea Inflammatory markers elevated  Resolved Hospital Problem list   N/A  Assessment & Plan:  COVID pneumonia- does not need intubation at this time. - Encourage proning - Agree with steroids, remdesivir, barcitinib - Encourage IS - Decision to intubate should be based on work of breathing, changes in mental status, could consider an amp of bicarb to help his metabolic acidosis which clouds picture  AKI- if supplemental fluids given, please use a bicarb drip  Best practice:  Diet: per primary Pain/Anxiety/Delirium protocol (if indicated): per  primary VAP protocol (if indicated): n/a DVT prophylaxis: lovenox GI prophylaxis: n/a Glucose control: n/a Mobility: up to chair during day if tolerates Code Status: full Family Communication: per primary Disposition: should be okay for PCU  Labs   CBC: Recent Labs  Lab 2020-05-29 0414  WBC 14.4*  NEUTROABS 11.9*  HGB 17.6*  HCT 52.5*  MCV 80.8  PLT 226    Basic Metabolic Panel: Recent Labs  Lab 05-29-2020 0414  NA 131*  K 3.7  CL 96*  CO2 16*  GLUCOSE 137*  BUN 23*  CREATININE 1.79*  CALCIUM 8.4*   GFR: Estimated Creatinine Clearance: 75.7 mL/min (A) (by C-G formula based on SCr of 1.79 mg/dL (H)). Recent Labs  Lab 2020/05/29 0414 05-29-2020 0439 2020/05/29 1305  PROCALCITON 1.15  --   --   WBC 14.4*  --   --   LATICACIDVEN  --  2.4* 1.5    Liver Function Tests: Recent Labs  Lab 05-29-20 0414  AST 140*  ALT 39  ALKPHOS 75  BILITOT 0.9  PROT 7.6  ALBUMIN 3.1*   No results for input(s): LIPASE, AMYLASE in the last 168 hours. No results for input(s): AMMONIA in the last 168 hours.  ABG No results found for: PHART, PCO2ART, PO2ART, HCO3, TCO2, ACIDBASEDEF, O2SAT   Coagulation Profile: No results for input(s): INR, PROTIME in the last 168 hours.  Cardiac Enzymes: No results for input(s): CKTOTAL, CKMB, CKMBINDEX, TROPONINI in the last 168 hours.  HbA1C: No results found for: HGBA1C  CBG: No results for input(s): GLUCAP in the last 168  hours.  Review of Systems:    Positive Symptoms in bold:  Constitutional fevers, chills, weight loss, fatigue, anorexia, malaise  Eyes decreased vision, double vision, eye irritation  Ears, Nose, Mouth, Throat sore throat, trouble swallowing, sinus congestion  Cardiovascular chest pain, paroxysmal nocturnal dyspnea, lower ext edema, palpitations   Respiratory SOB, cough, DOE, hemoptysis, wheezing  Gastrointestinal nausea, vomiting, diarrhea  Genitourinary burning with urination, trouble urinating    Musculoskeletal joint aches, joint swelling, back pain  Integumentary  rashes, skin lesions  Neurological focal weakness, focal numbness, trouble speaking, headaches  Psychiatric depression, anxiety, confusion  Endocrine polyuria, polydipsia, cold intolerance, heat intolerance  Hematologic abnormal bruising, abnormal bleeding, unexplained nose bleeds  Allergic/Immunologic recurrent infections, hives, swollen lymph nodes     Past Medical History  He,  has no past medical history on file.   Surgical History   History reviewed. No pertinent surgical history.   Social History   reports that he has never smoked. He has never used smokeless tobacco. He reports that he does not drink alcohol and does not use drugs.   Family History   His family history is not on file.   Allergies No Known Allergies   Home Medications  Prior to Admission medications   Medication Sig Start Date End Date Taking? Authorizing Provider  acetaminophen (TYLENOL) 500 MG tablet Take 500 mg by mouth every 6 (six) hours as needed.   Yes [provider]  benzonatate (TESSALON) 100 MG capsule Take 1 capsule (100 mg total) by mouth every 8 (eight) hours. 05/11/20  Yes Fondaw, Stevphen Meuse S, PA  cholecalciferol (VITAMIN D3) 25 MCG (1000 UNIT) tablet Take 1,000 Units by mouth daily.   Yes [provider]  ondansetron (ZOFRAN ODT) 4 MG disintegrating tablet Take 1 tablet (4 mg total) by mouth every 8 (eight) hours as needed for nausea or vomiting. 05/11/20  Yes Fondaw, Wylder S, PA  promethazine-dextromethorphan (PROMETHAZINE-DM) 6.25-15 MG/5ML syrup Take 5 mLs by mouth 4 (four) times daily as needed for cough. 05/11/20  Yes Gailen Shelter, PA

## 2020-05-15 NOTE — ED Notes (Addendum)
MD Ophelia Charter made aware of 02 maintaining around mid 80's and RR > 35. Nurse to call RT and start HFNC.

## 2020-05-15 NOTE — ED Notes (Signed)
Father updated.

## 2020-05-15 NOTE — ED Notes (Signed)
Patient sats dropped to 85% went into room to check on patient and found that patient had removed [place] , replaced and sats returned to 91%

## 2020-05-15 NOTE — ED Notes (Signed)
Girlfriend - Cala Bradford [phone] and mother phone is [phone]

## 2020-05-15 NOTE — ED Provider Notes (Signed)
[facility] EMERGENCY DEPARTMENT Provider Note   CSN: [id number] Arrival date & time: 05/20/2020  0355     History Chief Complaint  Patient presents with   Shortness of Breath   Nausea   Emesis    Brian Mack is a 34 y.o. male.  The history is provided by the patient.  Shortness of Breath Severity:  Moderate Onset quality:  Gradual Timing:  Constant Progression:  Worsening Chronicity:  New Relieved by:  None tried Worsened by:  Nothing Associated symptoms: cough, fever and vomiting   Emesis Associated symptoms: chills, cough, diarrhea and fever   Patient was diagnosed with COVID-19 on August 29 Reports he began having symptoms approximately 4 days prior to that He reports over the past day he has had increasing vomiting and diarrhea.  He also reports increasing cough and shortness of breath.  He will also have episodes of abdominal pain. He has no other medical conditions, does not take any daily medication     PMH-none Soc hx - nonsmoker He is UNvaccinated for COVID-19 Social History   Tobacco Use   Smoking status: Not on file   Smokeless tobacco: Never Used  Substance Use Topics   Alcohol use: No   Drug use: Not on file    Home Medications Prior to Admission medications   Medication Sig Start Date End Date Taking? Authorizing Provider  benzonatate (TESSALON) 100 MG capsule Take 1 capsule (100 mg total) by mouth every 8 (eight) hours. 05/11/20   Gailen Shelter, PA  ibuprofen (ADVIL,MOTRIN) 600 MG tablet Take 1 tablet (600 mg total) by mouth every 6 (six) hours as needed for pain. 04/27/13   Antony Madura, PA-C  ondansetron (ZOFRAN ODT) 4 MG disintegrating tablet Take 1 tablet (4 mg total) by mouth every 8 (eight) hours as needed for nausea or vomiting. 05/11/20   Gailen Shelter, PA  promethazine-dextromethorphan (PROMETHAZINE-DM) 6.25-15 MG/5ML syrup Take 5 mLs by mouth 4 (four) times daily as needed for cough. 05/11/20   Gailen Shelter, PA    Allergies    Patient has no known allergies.  Review of Systems   Review of Systems  Constitutional: Positive for chills, fatigue and fever.  Respiratory: Positive for cough and shortness of breath.   Gastrointestinal: Positive for diarrhea, nausea and vomiting. Negative for blood in stool.  All other systems reviewed and are negative.   Physical Exam Updated Vital Signs BP 132/66 (BP Location: Right Arm)    Pulse (!) 130    Temp (!) 101.4 F (38.6 C) (Oral)    Resp (!) 28    SpO2 (!) 77%   Physical Exam CONSTITUTIONAL: Well developed, distress noted HEAD: Normocephalic/atraumatic EYES: EOMI/PERRL ENMT: Mask in place NECK: supple no meningeal signs SPINE/BACK:entire spine nontender CV: S1/S2 noted, no murmurs/rubs/gallops noted LUNGS: Tachypneic, coarse breath sounds bilaterally, pulse ox 84% on 2 L nasal cannula ABDOMEN: soft, nontender, no rebound or guarding, bowel sounds noted throughout abdomen GU:no cva tenderness NEURO: Pt is awake/alert/appropriate, moves all extremitiesx4.  No facial droop.   EXTREMITIES: pulses normal/equal, full ROM SKIN: warm, color normal PSYCH: Mildly anxious  ED Results / Procedures / Treatments   Labs (all labs ordered are listed, but only abnormal results are displayed) Labs Reviewed  CBC WITH DIFFERENTIAL/PLATELET - Abnormal; Notable for the following components:      Result Value   WBC 14.4 (*)    RBC 6.50 (*)    Hemoglobin 17.6 (*)    HCT 52.5 (*)  Neutro Abs 11.9 (*)    Monocytes Absolute 1.4 (*)    Abs Immature Granulocytes 0.10 (*)    All other components within normal limits  COMPREHENSIVE METABOLIC PANEL - Abnormal; Notable for the following components:   Sodium 131 (*)    Chloride 96 (*)    CO2 16 (*)    Glucose, Bld 137 (*)    BUN 23 (*)    Creatinine, Ser 1.79 (*)    Calcium 8.4 (*)    Albumin 3.1 (*)    AST 140 (*)    GFR calc non Af Amer 49 (*)    GFR calc Af Amer 57 (*)    Anion gap 19 (*)      All other components within normal limits  D-DIMER, QUANTITATIVE (NOT AT [facility]) - Abnormal; Notable for the following components:   D-Dimer, Quant 2.38 (*)    All other components within normal limits  LACTATE DEHYDROGENASE - Abnormal; Notable for the following components:   LDH 1,214 (*)    All other components within normal limits  FIBRINOGEN - Abnormal; Notable for the following components:   Fibrinogen 708 (*)    All other components within normal limits  TRIGLYCERIDES - Abnormal; Notable for the following components:   Triglycerides 229 (*)    All other components within normal limits  CULTURE, BLOOD (ROUTINE X 2)  CULTURE, BLOOD (ROUTINE X 2)  LACTIC ACID, PLASMA  LACTIC ACID, PLASMA  PROCALCITONIN  FERRITIN  C-REACTIVE PROTEIN    EKG EKG Interpretation  Date/Time:  Thursday May 15 2020 04:10:21 EDT Ventricular Rate:  130 PR Interval:  142 QRS Duration: 76 QT Interval:  298 QTC Calculation: 438 R Axis:   78 Text Interpretation: Sinus tachycardia Possible Left atrial enlargement Abnormal ECG Interpretation limited secondary to artifact Confirmed by Zadie Rhine ([id number]) on 05/21/2020 4:17:38 AM   Radiology DG Chest Port 1 View  Result Date: 05/29/2020 CLINICAL DATA:  Shortness of breath EXAM: PORTABLE CHEST 1 VIEW COMPARISON:  None. FINDINGS: Low volume chest with mild patchy pulmonary density mainly at the bases. No Kerley lines, effusion, or pneumothorax. Normal heart size for technique. IMPRESSION: Atypical pneumonia pattern. Electronically Signed   By: Marnee Spring M.D.   On: 05/31/2020 04:55    Procedures .Critical Care Performed by: Zadie Rhine, MD Authorized by: Zadie Rhine, MD   Critical care provider statement:    Critical care time (minutes):  50   Critical care start time:  05/29/2020 4:30 AM   Critical care end time:  05/29/2020 5:20 AM   Critical care time was exclusive of:  Separately billable procedures and treating other  patients   Critical care was necessary to treat or prevent imminent or life-threatening deterioration of the following conditions:  Respiratory failure and sepsis   Critical care was time spent personally by me on the following activities:  Ordering and performing treatments and interventions, ordering and review of laboratory studies, ordering and review of radiographic studies, pulse oximetry, re-evaluation of patient's condition, development of treatment plan with patient or surrogate, discussions with consultants, evaluation of patient's response to treatment, obtaining history from patient or surrogate and examination of patient   I assumed direction of critical care for this patient from another provider in my specialty: no      Medications Ordered in ED Medications  remdesivir 200 mg in sodium chloride 0.9% 250 mL IVPB (has no administration in time range)  remdesivir 100 mg in sodium chloride 0.9 % 100 mL IVPB (  has no administration in time range)  lactated ringers bolus 1,000 mL (has no administration in time range)  acetaminophen (TYLENOL) tablet 650 mg (650 mg Oral Given 05/28/2020 0417)  dexamethasone (DECADRON) injection 6 mg (6 mg Intravenous Given 05/17/2020 0534)    ED Course  I have reviewed the triage vital signs and the nursing notes.  Pertinent labs & imaging results that were available during my care of the patient were reviewed by me and considered in my medical decision making (see chart for details).    MDM Rules/Calculators/A&P                          4:30 AM Patient was diagnosed with COVID-19 auto plant.  Report of the past day that included vomiting and diarrhea and shortness of breath. Patient is in clear respiratory distress initial pulse ox was 77% on room air. He has been placed on 6 L nasal cannula with improvement of pulse ox to the low 90s. X-ray and labs are pending at this time.  Patient will be admitted to hospital He reports brief intermittent episodes of  abdominal pain, none at this time, will follow 5:43 AM Patient has improved.  Heart rate and work of breathing is improving.  Oxygenation is currently 95% on 6 L of oxygen He has no focal abdominal tenderness, he denies chest pain Labs are pending at this time.  Discussed the case with his girlfriend at his request-[phone] 6:47 AM Patient found to have significant dehydration likely from his GI component of COVID-19.  Patient reports significant diarrhea at home.  Will give IV fluids.  He has been given Decadron and remdesivir has been ordered Discussed with Dr. Julian Reil for admission  Brian Mack was evaluated in Emergency Department on 06/07/2020 for the symptoms described in the history of present illness. He was evaluated in the context of the global COVID-19 pandemic, which necessitated consideration that the patient might be at risk for infection with the SARS-CoV-2 virus that causes COVID-19. Institutional protocols and algorithms that pertain to the evaluation of patients at risk for COVID-19 are in a state of rapid change based on information released by regulatory bodies including the CDC and federal and state organizations. These policies and algorithms were followed during the patient's care in the ED.  Final Clinical Impression(s) / ED Diagnoses Final diagnoses:  Nausea vomiting and diarrhea  Acute respiratory failure with hypoxia (HCC)  AKI (acute kidney injury) (HCC)  Dehydration    Rx / DC Orders ED Discharge Orders    None       Zadie Rhine, MD 05/20/2020 [phone]

## 2020-05-15 NOTE — Progress Notes (Signed)
Patient has had progressive respiratory failure despite interventions thus far.  He has been given Actemra x 1 (baricitinib due to formulary substitution).  O2 sats remain in mid-80s with RR >35.  As such, will transition him to HFNC.  I have also called PCCM to make them aware +/- formal consult.   Georgana Curio, M.D.

## 2020-05-15 NOTE — ED Triage Notes (Signed)
Patient with Covid, + on 8/26, Patient with N/V/D.  See at [facility] three days ago and seen at [facility] for the same.  Patient given meds, not working for him.  Patient having coughing fits.

## 2020-05-15 NOTE — ED Notes (Signed)
Called patient Brian Mack [phone] update given. Patient continues to accidentally pull  [place] out , sats will drop to 86% when placed back on increased sat to 91%

## 2020-05-15 NOTE — H&P (Signed)
History and Physical    Brian Mack WVP:[id number] DOB: June 16, 1986 DOA: 06-07-2020  PCP: Patient, No Pcp Per Consultants:  None Patient coming from:  Home - lives with girlfriend and her step-daughter; NOKO[place]en Bowl, [phone]  Chief Complaint: Worsening COVID symptoms  HPI: Brian Mack is a 34 y.o. male with no known significant medical history (other than obesity, BMI 37.84) presenting with worsening COVID symptoms.  Patient was COVID + on 8/26.  He was seen in the ER on 8/29 for congestion and cough as well as nausea and diarrhea.  O2 sats were 93% with ambulation and he was discharged.  Upon presentation today, sats 77% on RA, improved with 6L NCO2.  Also found to have AKI, given IVF.  He reports that he has been feeling poorly.  His girlfriend is also sick.   ED Course:  Carryover, per Dr. Julian Reil:  34 yo M with COVID and resp failure, on 6L. Excessive diarrhea so creat 1.7 from dehydration.  Got decadron and remdesivir.  Review of Systems: As per HPI; otherwise review of systems reviewed and negative.   Ambulatory Status:  Ambulates without assistance  COVID Vaccine Status:  None   History reviewed. No pertinent past medical history.  History reviewed. No pertinent surgical history.  Social History   Socioeconomic History  . Marital status: Single    Spouse name: Not on file  . Number of children: Not on file  . Years of education: Not on file  . Highest education level: Not on file  Occupational History  . Occupation: security  Tobacco Use  . Smoking status: Never Smoker  . Smokeless tobacco: Never Used  Substance and Sexual Activity  . Alcohol use: No  . Drug use: Never  . Sexual activity: Not on file  Other Topics Concern  . Not on file  Social History Narrative  . Not on file   Social Determinants of Health   Financial Resource Strain:   . Difficulty of Paying Living Expenses: Not on file  Food Insecurity:   . Worried  About Programme researcher, broadcasting/film/video in the Last Year: Not on file  . Ran Out of Food in the Last Year: Not on file  Transportation Needs:   . Lack of Transportation (Medical): Not on file  . Lack of Transportation (Non-Medical): Not on file  Physical Activity:   . Days of Exercise per Week: Not on file  . Minutes of Exercise per Session: Not on file  Stress:   . Feeling of Stress : Not on file  Social Connections:   . Frequency of Communication with Friends and Family: Not on file  . Frequency of Social Gatherings with Friends and Family: Not on file  . Attends Religious Services: Not on file  . Active Member of Clubs or Organizations: Not on file  . Attends Banker Meetings: Not on file  . Marital Status: Not on file  Intimate Partner Violence:   . Fear of Current or Ex-Partner: Not on file  . Emotionally Abused: Not on file  . Physically Abused: Not on file  . Sexually Abused: Not on file    No Known Allergies  History reviewed. No pertinent family history.  Prior to Admission medications   Medication Sig Start Date End Date Taking? Authorizing Provider  benzonatate (TESSALON) 100 MG capsule Take 1 capsule (100 mg total) by mouth every 8 (eight) hours. 05/11/20   Gailen Shelter, PA  ibuprofen (ADVIL,MOTRIN) 600 MG tablet Take 1  tablet (600 mg total) by mouth every 6 (six) hours as needed for pain. 04/27/13   Antony Madura, PA-C  ondansetron (ZOFRAN ODT) 4 MG disintegrating tablet Take 1 tablet (4 mg total) by mouth every 8 (eight) hours as needed for nausea or vomiting. 05/11/20   Gailen Shelter, PA  promethazine-dextromethorphan (PROMETHAZINE-DM) 6.25-15 MG/5ML syrup Take 5 mLs by mouth 4 (four) times daily as needed for cough. 05/11/20   Gailen Shelter, PA    Physical Exam: Vitals:   05-24-2020 0402 2020/05/24 0529 05/24/20 0655  BP: 132/66 125/88 125/89  Pulse: (!) 130 (!) 112 (!) 105  Resp: (!) 28 (!) 35 (!) 39  Temp: (!) 101.4 F (38.6 C)    TempSrc: Oral    SpO2:  (!) 77% 94% 95%     . General:  Appears calm and comfortable and is NAD; mildly ill-appearing, very flat affect . Eyes:  PERRL, EOMI, normal lids, iris . ENT:  grossly normal hearing, lips & tongue, mmm; appropriate dentition . Neck:  no LAD, masses or thyromegaly . Cardiovascular:  RR with tachcyardia, no m/r/g. No LE edema.  Marland Kitchen Respiratory:   Diffuse rhonchi.  Increased respiratory effort up to RR 40, on 6L [place] O2 . Abdomen:  soft, NT, ND, NABS . Skin:  no rash or induration seen on limited exam . Musculoskeletal:  grossly normal tone BUE/BLE, good ROM, no bony abnormality . Psychiatric:  Very flat mood and affect, speech fluent and appropriate, AOx3 . Neurologic:  CN 2-12 grossly intact, moves all extremities in coordinated fashion    Radiological Exams on Admission: DG Chest Port 1 View  Result Date: May 24, 2020 CLINICAL DATA:  Shortness of breath EXAM: PORTABLE CHEST 1 VIEW COMPARISON:  None. FINDINGS: Low volume chest with mild patchy pulmonary density mainly at the bases. No Kerley lines, effusion, or pneumothorax. Normal heart size for technique. IMPRESSION: Atypical pneumonia pattern. Electronically Signed   By: Marnee Spring M.D.   On: 2020/05/24 04:55    EKG: Independently reviewed.  Sinus tachycardia with rate 130; nonspecific ST changes with no evidence of acute ischemia   Labs on Admission: I have personally reviewed the available labs and imaging studies at the time of the admission.  Pertinent labs:   Na++ 131 CO2 16 Glucose 137 BUN 23/Creatinine 1.79/GFR 49 Albumin 3.1 AST 140/ALT 39 LDH 1214 Ferritin 1488 CRP 10.4 Lactate 2.4 WBC 14.4 D-dimer 2.38 Fibrinogen 708 Blood cultures pending COVID POSITIVE on 8/29   Assessment/Plan Principal Problem:   Acute hypoxemic respiratory failure due to COVID-19 [facility]) Active Problems:   Class 2 obesity due to excess calories with body mass index (BMI) of 37.0 to 37.9 in adult   Acute respiratory failure with  hypoxia due to COVID-19 PNA -Patient with presenting with SOB and diarrhea at home -He does not have a usual home O2 requirement and is currently requiring 6L [place] O2 -COVID POSITIVE -The patient has comorbidities which may increase the risk for ARDS/MODS including:  obesity -Exam is concerning for development of ARDS/MODS due to respiratory distress -Pertinent labs concerning for COVID include increased BUN/Creatinine; increased LFTs; markedly increased LDH; markedly elevated D-dimer (>1); markedly increased ferritin; markedly elevated CRP (>7); increased fibrinogen -CXR with multifocal opacities which may be c/w COVID vs. Multifocal PNA -Consider CT chest for evaluation of PE, but currently creatinine is elevated so will hold -Will treat with broad-spectrum antibiotics given procalcitonin >0.5.   -Will admit to progressive care unit for further evaluation, close monitoring, and treatment -  Monitor on telemetry x at least 24 hours -At this time, will attempt to avoid use of aerosolized medications and use HFAs instead -Will check daily labs including BMP with Mag, Phos; LFTs; CBC with differential; CRP; ferritin; fibrinogen; D-dimer -Will order steroids and Remdesivir (pharmacy consult) given +COVID test, +CXR, and hypoxia <94% on room air -If the patient shows clinical deterioration, consider transfer to ICU with PCCM consultation -Consider Tocilizumabif the patient does not stabilize on current treatment or if the patient has marked clinical decompensation; the patient is at high risk for needing this medication and has been consented for it.  Will plan to give if O2 requirement increases at all. -Will attempt to maintain euvolemia to a net negative fluid status -Will ask the patient to maintain an awake prone position for 16+ hours a day, if possible, with a minimum of 2-3 hours at a time -With D-dimer <5, will use standard-dosed Lovenox for DVT prevention -Patient was seen wearing full PPE  including: gown, gloves, head cover, N95, and face shield; donning and doffing was in compliance with current standards.  Obesity -BMI 37.84 -Weight loss should be encouraged -Outpatient PCP/bariatric medicine/bariatric surgery f/u encouraged    DVT prophylaxis:  Lovenox  Code Status:  Full  Family Communication: None present; I spoke with the patient's girlfriend by telephone. Disposition Plan:  The patient is from: home  Anticipated d/c is to: home without [facility] services once her respiratory issues have been resolved.  He may require home O2 at the time of discharge.  Anticipated d/c date will depend on clinical response to treatment, likely between 3 days (with completion of outpatient Remdesivir treatment) and 5 days  Patient is currently: acutely ill Consults called: None  Admission status: Admit - It is my clinical opinion that admission to INPATIENT is reasonable and necessary because of the expectation that this patient will require hospital care that crosses at least 2 midnights to treat this condition based on the medical complexity of the problems presented.  Given the aforementioned information, the predictability of an adverse outcome is felt to be significant.      Jonah Blue MD [facility]   How to contact the [facility] Attending or Consulting provider 7A - 7P or covering provider during after hours 7P -7A, for this patient?  1. Check the care team in [facility] and look for a) attending/consulting [facility] provider listed and b) the [facility] team listed 2. Log into [email or web address] and use [place]'s universal password to access. If you do not have the password, please contact the hospital operator. 3. Locate the [facility] provider you are looking for under [facility] and page to a number that you can be directly reached. 4. If you still have difficulty reaching the provider, please page the [facility] (Director on Call) for the Hospitalists listed on amion for assistance.   06/10/2020,  9:03 AM

## 2020-05-16 LAB — CBC WITH DIFFERENTIAL/PLATELET
Abs Immature Granulocytes: 0.14 10*3/uL — ABNORMAL HIGH (ref 0.00–0.07)
Basophils Absolute: 0 10*3/uL (ref 0.0–0.1)
Basophils Relative: 0 %
Eosinophils Absolute: 0 10*3/uL (ref 0.0–0.5)
Eosinophils Relative: 0 %
HCT: 47.4 % (ref 39.0–52.0)
Hemoglobin: 15.9 g/dL (ref 13.0–17.0)
Immature Granulocytes: 1 %
Lymphocytes Relative: 13 %
Lymphs Abs: 1.3 10*3/uL (ref 0.7–4.0)
MCH: 26.9 pg (ref 26.0–34.0)
MCHC: 33.5 g/dL (ref 30.0–36.0)
MCV: 80.2 fL (ref 80.0–100.0)
Monocytes Absolute: 1.6 10*3/uL — ABNORMAL HIGH (ref 0.1–1.0)
Monocytes Relative: 17 %
Neutro Abs: 6.8 10*3/uL (ref 1.7–7.7)
Neutrophils Relative %: 69 %
Platelets: 235 10*3/uL (ref 150–400)
RBC: 5.91 MIL/uL — ABNORMAL HIGH (ref 4.22–5.81)
RDW: 13 % (ref 11.5–15.5)
WBC: 9.9 10*3/uL (ref 4.0–10.5)
nRBC: 0 % (ref 0.0–0.2)

## 2020-05-16 LAB — HIV ANTIBODY (ROUTINE TESTING W REFLEX): HIV Screen 4th Generation wRfx: NONREACTIVE

## 2020-05-16 LAB — COMPREHENSIVE METABOLIC PANEL
ALT: 38 U/L (ref 0–44)
AST: 94 U/L — ABNORMAL HIGH (ref 15–41)
Albumin: 2.8 g/dL — ABNORMAL LOW (ref 3.5–5.0)
Alkaline Phosphatase: 64 U/L (ref 38–126)
Anion gap: 13 (ref 5–15)
BUN: 22 mg/dL — ABNORMAL HIGH (ref 6–20)
CO2: 21 mmol/L — ABNORMAL LOW (ref 22–32)
Calcium: 8.4 mg/dL — ABNORMAL LOW (ref 8.9–10.3)
Chloride: 100 mmol/L (ref 98–111)
Creatinine, Ser: 1.26 mg/dL — ABNORMAL HIGH (ref 0.61–1.24)
GFR calc Af Amer: >60 mL/min (ref >60)
GFR calc non Af Amer: >60 mL/min (ref >60)
Glucose, Bld: 138 mg/dL — ABNORMAL HIGH (ref 70–99)
Potassium: 3.4 mmol/L — ABNORMAL LOW (ref 3.5–5.1)
Sodium: 134 mmol/L — ABNORMAL LOW (ref 135–145)
Total Bilirubin: 0.8 mg/dL (ref 0.3–1.2)
Total Protein: 6.6 g/dL (ref 6.5–8.1)

## 2020-05-16 LAB — FERRITIN: Ferritin: 1518 ng/mL — ABNORMAL HIGH (ref 24–336)

## 2020-05-16 LAB — PHOSPHORUS: Phosphorus: 2.8 mg/dL (ref 2.5–4.6)

## 2020-05-16 LAB — MAGNESIUM: Magnesium: 2.3 mg/dL (ref 1.7–2.4)

## 2020-05-16 LAB — C-REACTIVE PROTEIN: CRP: 10.3 mg/dL — ABNORMAL HIGH (ref <1.0)

## 2020-05-16 LAB — D-DIMER, QUANTITATIVE: D-Dimer, Quant: 1.73 ug/mL-FEU — ABNORMAL HIGH (ref 0.00–0.50)

## 2020-05-16 MED ORDER — SALINE SPRAY 0.65 % NA SOLN
1.0000 | NASAL | Status: DC | PRN
  Administered 2020-05-16: 1 via NASAL
  Filled 2020-05-16: qty 44

## 2020-05-16 MED ORDER — POTASSIUM CHLORIDE CRYS ER 20 MEQ PO TBCR
40.0000 meq | EXTENDED_RELEASE_TABLET | Freq: Once | ORAL | Status: AC
  Administered 2020-05-16: 40 meq via ORAL
  Filled 2020-05-16: qty 2

## 2020-05-16 MED ORDER — LIP MEDEX EX OINT
1.0000 "application " | TOPICAL_OINTMENT | CUTANEOUS | Status: DC | PRN
  Administered 2020-05-18 – 2020-06-09 (×3): 1 via TOPICAL
  Filled 2020-05-16: qty 7

## 2020-05-16 MED ORDER — BARICITINIB 2 MG PO TABS
4.0000 mg | ORAL_TABLET | Freq: Every day | ORAL | Status: DC
  Administered 2020-05-17 – 2020-05-23 (×7): 4 mg via ORAL
  Filled 2020-05-16 (×7): qty 2

## 2020-05-16 MED ORDER — PANTOPRAZOLE SODIUM 40 MG PO TBEC
40.0000 mg | DELAYED_RELEASE_TABLET | Freq: Every day | ORAL | Status: DC
  Administered 2020-05-16 – 2020-05-23 (×8): 40 mg via ORAL
  Filled 2020-05-16 (×8): qty 1

## 2020-05-16 NOTE — Progress Notes (Signed)
PROGRESS NOTE  Brian Mack YHC:[id number] DOB: 11/20/1985 DOA: 06/12/2020  PCP: Patient, No Pcp Per  Brief History/Interval Summary: 34 year old Caucasian male with no significant past medical history except for obesity who came in with worsening cough shortness of breath.  He previously has had nausea and diarrhea.  He was noted to be hypoxic.  Hospitalized for further management.  Reason for Visit: Acute respiratory failure with hypoxia.  Pneumonia due to COVID-19.  Consultants: Seen by pulmonology  Procedures: None  Antibiotics: Anti-infectives (From admission, onward)   Start     Dose/Rate Route Frequency Ordered Stop   05/16/20 1000  remdesivir 100 mg in sodium chloride 0.9 % 100 mL IVPB        100 mg 200 mL/hr over 30 Minutes Intravenous Daily 25-Feb-2020 0517     25-Feb-2020 0915  cefTRIAXone (ROCEPHIN) 2 g in sodium chloride 0.9 % 100 mL IVPB        2 g 200 mL/hr over 30 Minutes Intravenous Every 24 hours 25-Feb-2020 0909     25-Feb-2020 0915  azithromycin (ZITHROMAX) 500 mg in sodium chloride 0.9 % 250 mL IVPB        500 mg 250 mL/hr over 60 Minutes Intravenous Every 24 hours 25-Feb-2020 0909     25-Feb-2020 0530  remdesivir 200 mg in sodium chloride 0.9% 250 mL IVPB        200 mg 580 mL/hr over 30 Minutes Intravenous Once 25-Feb-2020 0517 25-Feb-2020 0746      Subjective/Interval History: Patient states that he still feels very poorly.  Gets short of breath with minimal exertion.  Noted to be on heated high flow along with nonrebreather.  Denies any chest pain.  Has had some nausea.  Admits to acid reflux.  ROS: Denies any headaches currently.    Assessment/Plan:  Acute Hypoxic Resp. Failure/Pneumonia due to COVID-19   Recent Labs  Lab 25-Feb-2020 0414 05/16/20 0408  DDIMER 2.38* 1.73*  FERRITIN 1,488* 1,518*  CRP 10.4* 10.3*  ALT 39 38  PROCALCITON 1.15  --     Objective findings: Fever: Noted to have a temperature of 101.4 yesterday morning.  None since then. Oxygen  requirements: Heated high flow nasal cannula 25 L/min with 100% nonrebreather.  Saturating in the early 90s.  COVID 19 Therapeutics: Antibacterials: Ceftriaxone and azithromycin.  Procalcitonin was 1.15. Remdesivir: Day 2 Steroids: Solu-Medrol Diuretics: None currently Actemra/Baricitinib: Started on baricitinib on 9/2 PUD Prophylaxis: Initiate Pepcid DVT Prophylaxis:  Lovenox 60 mg daily  From a respiratory standpoint patient remains tenuous.  He is on heated high flow at 25 L along with the nonrebreather.  Patient remains on Remdesivir and steroids.  He was also started on baricitinib yesterday.  His procalcitonin was noted to be slightly elevated so he is also on antibacterials.  Patient complaining of some nausea.  He also has acid reflux.  We will put him on Pepcid twice a day.  Continue incentive spirometry mobilization.  Prone positioning as much as possible.  His inflammatory markers remain elevated.  D-dimer improved to 1.73 from 2.38 yesterday.  Ferritin was noted to be elevated.  Recheck procalcitonin tomorrow.  The treatment plan and use of medications and known side effects were discussed with patient. Some of the medications used are based on case reports/anecdotal data.  All other medications being used in the management of COVID-19 based on limited study data.  Complete risks and long-term side effects are unknown, however in the best clinical judgment they seem to be of some  benefit.  Patient wanted to proceed with treatment options provided.  Acute kidney injury Patient's creatinine was noted to be 1.79.  Level not known.  Noted to be hypokalemic yesterday.  Patient's creatinine improved to 1.26 today.  Continue to monitor urine output.  Hyponatremia Likely due to hypovolemia.  Improved this morning.  Hypokalemia Will be repleted.  Magnesium is 2.3.  Transaminitis Secondary to COVID-19.  Continue to monitor.  Lactic acidosis Due to hypovolemia.  Improved from  2.4-1.7.  Obesity Estimated body mass index is 37.7 kg/m as calculated from the following:   Height as of this encounter: 5\' 9"  (1.753 m).   Weight as of this encounter: 115.8 kg.   DVT Prophylaxis: Lovenox Code Status: Full code Family Communication: Discussed with the patient.  He will inform his significant other Disposition Plan: Hopefully return home when improved  Status is: Inpatient  Remains inpatient appropriate because:IV treatments appropriate due to intensity of illness or inability to take PO and Inpatient level of care appropriate due to severity of illness   Dispo: The patient is from: Home              Anticipated d/c is to: Home              Anticipated d/c date is: 3 days              Patient currently is not medically stable to d/c.      Medications:  Scheduled: . baricitinib  2 mg Oral Daily  . enoxaparin (LOVENOX) injection  60 mg Subcutaneous Q24H  . methylPREDNISolone (SOLU-MEDROL) injection  0.5 mg/kg Intravenous Q12H  . sodium chloride flush  3 mL Intravenous Q12H   Continuous: . sodium chloride    . azithromycin 500 mg (05/16/20 0946)  . cefTRIAXone (ROCEPHIN)  IV 2 g (05/16/20 0928)  . remdesivir 100 mg in NS 100 mL 100 mg (05/16/20 0937)   07/16/20 chloride, acetaminophen, albuterol, chlorpheniramine-HYDROcodone, guaiFENesin-dextromethorphan, lip balm, ondansetron **OR** ondansetron (ZOFRAN) IV, oxyCODONE, sodium chloride, sodium chloride flush   Objective:  Vital Signs  Vitals:   05/16/20 0300 05/16/20 0400 05/16/20 0715 05/16/20 0826  BP:  (!) 129/92    Pulse:  95    Resp:  (!) 21    Temp:  98.3 F (36.8 C)    TempSrc:  Oral    SpO2:  90% 90% 93%  Weight:      Height: 5\' 9"  (1.753 m)       Intake/Output Summary (Last 24 hours) at 05/16/2020 1149 Last data filed at 05/16/2020 0700 Gross per 24 hour  Intake 370 ml  Output 600 ml  Net -230 ml   Filed Weights   05/16/20 0257  Weight: 115.8 kg    General appearance: Awake  alert.  In no distress.  Very anxious Resp:.  Noted to be tachypneic.  Some use of accessory muscles.  Crackles bilateral bases.  No wheezing or rhonchi. Cardio: S1-S2 is normal regular.  No S3-S4.  No rubs murmurs or bruit GI: Abdomen is soft.  Mildly tender in the epigastric area without any rebound rigidity or guarding.  No masses organomegaly.  Extremities: No edema.  Full range of motion of lower extremities. Neurologic: Alert and oriented x3.  No focal neurological deficits.    Lab Results:  Data Reviewed: I have personally reviewed following labs and imaging studies  CBC: Recent Labs  Lab 05/19/2020 0414 05/16/20 0408  WBC 14.4* 9.9  NEUTROABS 11.9* PENDING  HGB 17.6* 15.9  HCT 52.5* 47.4  MCV 80.8 80.2  PLT 226 235    Basic Metabolic Panel: Recent Labs  Lab 2020-05-28 0414 05/16/20 0408  NA 131* 134*  K 3.7 3.4*  CL 96* 100  CO2 16* 21*  GLUCOSE 137* 138*  BUN 23* 22*  CREATININE 1.79* 1.26*  CALCIUM 8.4* 8.4*  MG  --  2.3  PHOS  --  2.8    GFR: Estimated Creatinine Clearance: 105.6 mL/min (A) (by C-G formula based on SCr of 1.26 mg/dL (H)).  Liver Function Tests: Recent Labs  Lab 05-28-20 0414 05/16/20 0408  AST 140* 94*  ALT 39 38  ALKPHOS 75 64  BILITOT 0.9 0.8  PROT 7.6 6.6  ALBUMIN 3.1* 2.8*     Lipid Profile: Recent Labs    2020/05/28 0414  TRIG 229*     Anemia Panel: Recent Labs    2020/05/28 0414 05/16/20 0408  FERRITIN 1,488* 1,518*    Recent Results (from the past 240 hour(s))  SARS Coronavirus 2 by RT PCR (hospital order, performed in [facility] hospital lab) Nasopharyngeal Nasopharyngeal Swab     Status: Abnormal   Collection Time: 05/11/20  3:53 PM   Specimen: Nasopharyngeal Swab  Result Value Ref Range Status   SARS Coronavirus 2 POSITIVE (A) NEGATIVE Final    Comment: RESULT CALLED TO, READ BACK BY AND VERIFIED WITH: Netta Corrigan RN [id number] 05/11/20 JM (NOTE) SARS-CoV-2 target nucleic acids are DETECTED  SARS-CoV-2 RNA is  generally detectable in upper respiratory specimens  during the acute phase of infection.  Positive results are indicative  of the presence of the identified virus, but do not rule out bacterial infection or co-infection with other pathogens not detected by the test.  Clinical correlation with patient history and  other diagnostic information is necessary to determine patient infection status.  The expected result is negative.  Fact Sheet for Patients:   BoilerBrush.com.cy   Fact Sheet for Healthcare Providers:   https://pope.com/    This test is not yet approved or cleared by the [place] FDA and  has been authorized for detection and/or diagnosis of SARS-CoV-2 by FDA under an Emergency Use Authorization (EUA).  This EUA will remain in effect (meaning this test can  be used) for the duration of  the COVID-19 declaration under Section 564(b)(1) of the Act, 21 U.S.C. section 360-bbb-3(b)(1), unless the authorization is terminated or revoked sooner.  Performed at [facility], [address] [place], [place] [address]   Blood Culture (routine x 2)     Status: None (Preliminary result)   Collection Time: 05/28/20  4:37 AM   Specimen: BLOOD  Result Value Ref Range Status   Specimen Description BLOOD RIGHT ANTECUBITAL  Final   Special Requests   Final    BOTTLES DRAWN AEROBIC AND ANAEROBIC Blood Culture adequate volume   Culture   Final    NO GROWTH 1 DAY Performed at [facility], [address] [place], [place] [address]    Report Status PENDING  Incomplete  Blood Culture (routine x 2)     Status: None (Preliminary result)   Collection Time: May 28, 2020  5:20 AM   Specimen: BLOOD LEFT HAND  Result Value Ref Range Status   Specimen Description BLOOD LEFT HAND  Final   Special Requests   Final    BOTTLES DRAWN AEROBIC AND ANAEROBIC Blood Culture adequate volume   Culture   Final    NO GROWTH 1  DAY Performed at  [facility], [address] [place], [place] [address]    Report Status PENDING  Incomplete      Radiology Studies: DG Chest Port 1 View  Result Date: May 21, 2020 CLINICAL DATA:  Shortness of breath EXAM: PORTABLE CHEST 1 VIEW COMPARISON:  None. FINDINGS: Low volume chest with mild patchy pulmonary density mainly at the bases. No Kerley lines, effusion, or pneumothorax. Normal heart size for technique. IMPRESSION: Atypical pneumonia pattern. Electronically Signed   By: Marnee Spring M.D.   On: 2020/05/21 04:55       LOS: 1 day   Brandolyn Shortridge  [facility] Pager on [email or web address]  05/16/2020, 11:49 AM

## 2020-05-16 NOTE — Progress Notes (Signed)
RT NOTES: Found patient with oxygen off, sats 79%. Placed patient back on oxygen, sats now 93%. Explained to patient the importance of wearing the oxygen, patient states understanding.

## 2020-05-17 ENCOUNTER — Inpatient Hospital Stay (HOSPITAL_COMMUNITY): Payer: HRSA Program

## 2020-05-17 DIAGNOSIS — E6609 Other obesity due to excess calories: Secondary | ICD-10-CM

## 2020-05-17 LAB — COMPREHENSIVE METABOLIC PANEL
ALT: 37 U/L (ref 0–44)
AST: 58 U/L — ABNORMAL HIGH (ref 15–41)
Albumin: 2.9 g/dL — ABNORMAL LOW (ref 3.5–5.0)
Alkaline Phosphatase: 60 U/L (ref 38–126)
Anion gap: 10 (ref 5–15)
BUN: 26 mg/dL — ABNORMAL HIGH (ref 6–20)
CO2: 24 mmol/L (ref 22–32)
Calcium: 8.5 mg/dL — ABNORMAL LOW (ref 8.9–10.3)
Chloride: 102 mmol/L (ref 98–111)
Creatinine, Ser: 1.52 mg/dL — ABNORMAL HIGH (ref 0.61–1.24)
GFR calc Af Amer: >60 mL/min (ref >60)
GFR calc non Af Amer: 60 mL/min — ABNORMAL LOW (ref >60)
Glucose, Bld: 162 mg/dL — ABNORMAL HIGH (ref 70–99)
Potassium: 3.6 mmol/L (ref 3.5–5.1)
Sodium: 136 mmol/L (ref 135–145)
Total Bilirubin: 1.2 mg/dL (ref 0.3–1.2)
Total Protein: 6.6 g/dL (ref 6.5–8.1)

## 2020-05-17 LAB — CBC WITH DIFFERENTIAL/PLATELET
Abs Immature Granulocytes: 0.3 10*3/uL — ABNORMAL HIGH (ref 0.00–0.07)
Basophils Absolute: 0 10*3/uL (ref 0.0–0.1)
Basophils Relative: 0 %
Eosinophils Absolute: 0 10*3/uL (ref 0.0–0.5)
Eosinophils Relative: 0 %
HCT: 47.3 % (ref 39.0–52.0)
Hemoglobin: 15.9 g/dL (ref 13.0–17.0)
Lymphocytes Relative: 1 %
Lymphs Abs: 0.1 10*3/uL — ABNORMAL LOW (ref 0.7–4.0)
MCH: 27.6 pg (ref 26.0–34.0)
MCHC: 33.6 g/dL (ref 30.0–36.0)
MCV: 82 fL (ref 80.0–100.0)
Metamyelocytes Relative: 1 %
Monocytes Absolute: 1.9 10*3/uL — ABNORMAL HIGH (ref 0.1–1.0)
Monocytes Relative: 13 %
Myelocytes: 1 %
Neutro Abs: 12.3 10*3/uL — ABNORMAL HIGH (ref 1.7–7.7)
Neutrophils Relative %: 84 %
Platelets: 335 10*3/uL (ref 150–400)
RBC: 5.77 MIL/uL (ref 4.22–5.81)
RDW: 13.2 % (ref 11.5–15.5)
WBC: 14.7 10*3/uL — ABNORMAL HIGH (ref 4.0–10.5)
nRBC: 0 % (ref 0.0–0.2)
nRBC: 0 /100 WBC

## 2020-05-17 LAB — PROCALCITONIN: Procalcitonin: 0.19 ng/mL

## 2020-05-17 LAB — C-REACTIVE PROTEIN: CRP: 3.5 mg/dL — ABNORMAL HIGH (ref <1.0)

## 2020-05-17 LAB — MAGNESIUM: Magnesium: 2.6 mg/dL — ABNORMAL HIGH (ref 1.7–2.4)

## 2020-05-17 LAB — D-DIMER, QUANTITATIVE: D-Dimer, Quant: 1.05 ug/mL-FEU — ABNORMAL HIGH (ref 0.00–0.50)

## 2020-05-17 NOTE — Progress Notes (Signed)
PROGRESS NOTE  Brian MinersSamuel Mack RUE:[id number] DOB: 10/04/1985 DOA: 05/16/2020  PCP: Patient, No Pcp Per  Brief History/Interval Summary: 34 year old Caucasian male with no significant past medical history except for obesity who came in with worsening cough shortness of breath.  He previously has had nausea and diarrhea.  He was noted to be hypoxic.  Hospitalized for further management.  Reason for Visit: Acute respiratory failure with hypoxia.  Pneumonia due to COVID-19.  Consultants: Seen by pulmonology  Procedures: None  Antibiotics: Anti-infectives (From admission, onward)   Start     Dose/Rate Route Frequency Ordered Stop   05/16/20 1000  remdesivir 100 mg in sodium chloride 0.9 % 100 mL IVPB        100 mg 200 mL/hr over 30 Minutes Intravenous Daily 05/28/2020 0517     05/31/2020 0915  cefTRIAXone (ROCEPHIN) 2 g in sodium chloride 0.9 % 100 mL IVPB        2 g 200 mL/hr over 30 Minutes Intravenous Every 24 hours 05/17/2020 0909 05/20/20 0914   05/22/2020 0915  azithromycin (ZITHROMAX) 500 mg in sodium chloride 0.9 % 250 mL IVPB        500 mg 250 mL/hr over 60 Minutes Intravenous Every 24 hours 06/02/2020 0909 05/20/20 0914   06/05/2020 0530  remdesivir 200 mg in sodium chloride 0.9% 250 mL IVPB        200 mg 580 mL/hr over 30 Minutes Intravenous Once 05/17/2020 0517 05/19/2020 0746      Subjective/Interval History: Patient mentions that he feels about the same as yesterday.  Still quite short of breath.  Still continues to have nausea and some upper abdominal discomfort.  No vomiting.  Diarrhea is resolving.      Assessment/Plan:  Acute Hypoxic Resp. Failure/Pneumonia due to COVID-19   Recent Labs  Lab 06/01/2020 0414 05/16/20 0408 05/17/20 0525  DDIMER 2.38* 1.73* 1.05*  FERRITIN 1,488* 1,518*  --   CRP 10.4* 10.3* 3.5*  ALT 39 38 37  PROCALCITON 1.15  --  0.19    Objective findings: Fever: Has been afebrile the last 24 hours Oxygen requirements: Remains on heated high  flow at 30 L/min along with a nonrebreather saturating in the late 80s to early 90s.    COVID 19 Therapeutics: Antibacterials: Ceftriaxone and azithromycin.  Procalcitonin was 1.15. Remdesivir: Day 3 Steroids: Solu-Medrol Diuretics: None currently Actemra/Baricitinib: Started on baricitinib on 9/2 PUD Prophylaxis: Protonix DVT Prophylaxis:  Lovenox 60 mg daily  From a respiratory standpoint patient remains tenuous.  He is requiring a lot of oxygen currently, heated high flow with 30 L/min along with nonrebreather.  Saturating in the late 80s to early 90s.  Patient with severe pneumonia due to COVID-19.  Currently on Remdesivir steroids and baricitinib.  Also on antibacterials due to elevated procalcitonin which has improved to 0.19 today.  CRP is improved to 3.5.  D-dimer improved to 1.05.  Mobilize.  Incentive spirometry.  Out of bed to chair.  Chest x-ray done this morning showed stable findings.  He was placed on PPI for acid reflux and upper abdominal discomfort thought to be due to gastritis.  Abdomen remains benign.  Continue to monitor.  Leukocytosis is due to steroids.  The treatment plan and use of medications and known side effects were discussed with patient. Some of the medications used are based on case reports/anecdotal data.  All other medications being used in the management of COVID-19 based on limited study data.  Complete risks and long-term side effects are  unknown, however in the best clinical judgment they seem to be of some benefit.  Patient wanted to proceed with treatment options provided.  Acute kidney injury Baseline creatinine is not known.  Presented with a creatinine of 1.79.  Improved to 1.26 yesterday.  Noted to be 1.52 today.  Monitor urine output.  Recheck labs tomorrow.  Encourage oral intake.    Hyponatremia Likely due to hypovolemia.  Improved.  Hypokalemia Repleted.  Magnesium is 2.6.  Transaminitis Secondary to COVID-19.  Continue to  monitor.  Lactic acidosis Due to hypovolemia.  Improved from 2.4-1.7.  Obesity Estimated body mass index is 37.7 kg/m as calculated from the following:   Height as of this encounter: 5\' 9"  (1.753 m).   Weight as of this encounter: 115.8 kg.   DVT Prophylaxis: Lovenox Code Status: Full code Family Communication: Discussed with the patient.  He will inform his significant other Disposition Plan: Hopefully return home when improved  Status is: Inpatient  Remains inpatient appropriate because:IV treatments appropriate due to intensity of illness or inability to take PO and Inpatient level of care appropriate due to severity of illness   Dispo:  Patient From: Home  Planned Disposition: Home  Expected discharge date: 05/20/20  Medically stable for discharge: No       Medications:  Scheduled:  baricitinib  4 mg Oral Daily   enoxaparin (LOVENOX) injection  60 mg Subcutaneous Q24H   methylPREDNISolone (SOLU-MEDROL) injection  0.5 mg/kg Intravenous Q12H   pantoprazole  40 mg Oral Q1200   sodium chloride flush  3 mL Intravenous Q12H   Continuous:  sodium chloride     azithromycin Stopped (05/17/20 0931)   cefTRIAXone (ROCEPHIN)  IV Stopped (05/17/20 0931)   remdesivir 100 mg in NS 100 mL Stopped (05/17/20 0931)   07/17/20 chloride, acetaminophen, albuterol, chlorpheniramine-HYDROcodone, guaiFENesin-dextromethorphan, lip balm, ondansetron **OR** ondansetron (ZOFRAN) IV, oxyCODONE, sodium chloride, sodium chloride flush   Objective:  Vital Signs  Vitals:   05/17/20 0905 05/17/20 1137 05/17/20 1158 05/17/20 1200  BP: 130/87 123/83 124/86 121/85  Pulse: 89 78 79 81  Resp: (!) 27 (!) 36 (!) 32 (!) 29  Temp: 97.6 F (36.4 C) (!) 97.5 F (36.4 C) 97.6 F (36.4 C)   TempSrc: Axillary Axillary Oral   SpO2: 94% 91% (!) 85% 90%  Weight:      Height:        Intake/Output Summary (Last 24 hours) at 05/17/2020 1414 Last data filed at 05/17/2020 1136 Gross per 24  hour  Intake 1148.29 ml  Output 400 ml  Net 748.29 ml   Filed Weights   05/16/20 0257  Weight: 115.8 kg    General appearance: Awake alert.  In no distress Resp: Tachypneic at rest.  No use of accessory muscles.  Coarse breath sounds with a few crackles bilateral bases.  No wheezing or rhonchi.   Cardio: S1-S2 is normal regular.  No S3-S4.  No rubs murmurs or bruit GI: Abdomen is soft.  Mildly tender in the epigastric area without any rebound rigidity or guarding.  No masses organomegaly.  Bowel sounds present normal.  No masses organomegaly Extremities: No edema.  Full range of motion of lower extremities. Neurologic: Alert and oriented x3.  No focal neurological deficits.    Lab Results:  Data Reviewed: I have personally reviewed following labs and imaging studies  CBC: Recent Labs  Lab 2020/05/19 0414 05/16/20 0408 05/17/20 0525  WBC 14.4* 9.9 14.7*  NEUTROABS 11.9* 6.8 12.3*  HGB 17.6* 15.9 15.9  HCT 52.5* 47.4 47.3  MCV 80.8 80.2 82.0  PLT 226 235 335    Basic Metabolic Panel: Recent Labs  Lab 06-06-2020 0414 05/16/20 0408 05/17/20 0525  NA 131* 134* 136  K 3.7 3.4* 3.6  CL 96* 100 102  CO2 16* 21* 24  GLUCOSE 137* 138* 162*  BUN 23* 22* 26*  CREATININE 1.79* 1.26* 1.52*  CALCIUM 8.4* 8.4* 8.5*  MG  --  2.3 2.6*  PHOS  --  2.8  --     GFR: Estimated Creatinine Clearance: 87.5 mL/min (A) (by C-G formula based on SCr of 1.52 mg/dL (H)).  Liver Function Tests: Recent Labs  Lab 06-Jun-2020 0414 05/16/20 0408 05/17/20 0525  AST 140* 94* 58*  ALT 39 38 37  ALKPHOS 75 64 60  BILITOT 0.9 0.8 1.2  PROT 7.6 6.6 6.6  ALBUMIN 3.1* 2.8* 2.9*     Lipid Profile: Recent Labs    Jun 06, 2020 0414  TRIG 229*     Anemia Panel: Recent Labs    06/06/20 0414 05/16/20 0408  FERRITIN 1,488* 1,518*    Recent Results (from the past 240 hour(s))  SARS Coronavirus 2 by RT PCR (hospital order, performed in [facility] hospital lab) Nasopharyngeal Nasopharyngeal  Swab     Status: Abnormal   Collection Time: 05/11/20  3:53 PM   Specimen: Nasopharyngeal Swab  Result Value Ref Range Status   SARS Coronavirus 2 POSITIVE (A) NEGATIVE Final    Comment: RESULT CALLED TO, READ BACK BY AND VERIFIED WITH: Netta Corrigan RN [phone] 05/11/20 JM (NOTE) SARS-CoV-2 target nucleic acids are DETECTED  SARS-CoV-2 RNA is generally detectable in upper respiratory specimens  during the acute phase of infection.  Positive results are indicative  of the presence of the identified virus, but do not rule out bacterial infection or co-infection with other pathogens not detected by the test.  Clinical correlation with patient history and  other diagnostic information is necessary to determine patient infection status.  The expected result is negative.  Fact Sheet for Patients:   BoilerBrush.com.cy   Fact Sheet for Healthcare Providers:   https://pope.com/    This test is not yet approved or cleared by the [place] FDA and  has been authorized for detection and/or diagnosis of SARS-CoV-2 by FDA under an Emergency Use Authorization (EUA).  This EUA will remain in effect (meaning this test can  be used) for the duration of  the COVID-19 declaration under Section 564(b)(1) of the Act, 21 U.S.C. section 360-bbb-3(b)(1), unless the authorization is terminated or revoked sooner.  Performed at [facility], [address], [place], [place] [address]   Blood Culture (routine x 2)     Status: None (Preliminary result)   Collection Time: 06-06-20  4:37 AM   Specimen: BLOOD  Result Value Ref Range Status   Specimen Description BLOOD RIGHT ANTECUBITAL  Final   Special Requests   Final    BOTTLES DRAWN AEROBIC AND ANAEROBIC Blood Culture adequate volume   Culture   Final    NO GROWTH 1 DAY Performed at [facility], [address], [place], [place] [address]    Report Status PENDING  Incomplete  Blood  Culture (routine x 2)     Status: None (Preliminary result)   Collection Time: 06-06-20  5:20 AM   Specimen: BLOOD LEFT HAND  Result Value Ref Range Status   Specimen Description BLOOD LEFT HAND  Final   Special Requests   Final  BOTTLES DRAWN AEROBIC AND ANAEROBIC Blood Culture adequate volume   Culture   Final    NO GROWTH 1 DAY Performed at [facility], [address] [place], [place] [address]    Report Status PENDING  Incomplete      Radiology Studies: DG Chest Port 1 View  Result Date: 05/17/2020 CLINICAL DATA:  Positive COVID-19. Short of breath. Follow-up exam. EXAM: PORTABLE CHEST 1 VIEW COMPARISON:  05/17/2020 FINDINGS: Lung volumes remain low. Hazy airspace opacities are again noted in the mid and lower lungs, unchanged from the prior exam. No new lung abnormalities. No pleural effusion or pneumothorax. Cardiac silhouette is normal in size. IMPRESSION: 1. Stable appearance from the previous exam with bilateral mid and lower lung zone hazy airspace opacities consistent with atypical/viral pneumonia. Electronically Signed   By: Amie Portland M.D.   On: 05/17/2020 10:21       LOS: 2 days   Unknown Schleyer Rito Ehrlich  [facility] Pager on [email or web address]  05/17/2020, 2:14 PM

## 2020-05-17 NOTE — Progress Notes (Signed)
   05/17/20 0747  Assess: MEWS Score  Temp 97.7 F (36.5 C)  BP 120/69  Pulse Rate 83  ECG Heart Rate 86  Resp (!) 28  Level of Consciousness Alert  SpO2 (!) 84 %  O2 Device HFNC;Non-rebreather Mask  Patient Activity (if Appropriate) In bed  O2 Flow Rate (L/min) 30 L/min (plus 15L NRB)  Assess: MEWS Score  MEWS Temp 0  MEWS Systolic 0  MEWS Pulse 0  MEWS RR 2  MEWS LOC 0  MEWS Score 2  MEWS Score Color Yellow  Assess: if the MEWS score is Yellow or Red  Were vital signs taken at a resting state? Yes  Focused Assessment No change from prior assessment  Early Detection of Sepsis Score *See Row Information* Low  MEWS guidelines implemented *See Row Information* Yes  Treat  MEWS Interventions Administered scheduled meds/treatments  Take Vital Signs  Increase Vital Sign Frequency  Yellow: Q 2hr X 2 then Q 4hr X 2, if remains yellow, continue Q 4hrs  Escalate  MEWS: Escalate Yellow: discuss with charge nurse/RN and consider discussing with provider and RRT  Notify: Charge Nurse/RN  Name of Charge Nurse/RN Notified Liana RN  Date Charge Nurse/RN Notified 05/17/20  Time Charge Nurse/RN Notified [phone]  Document  Progress note created (see row info) Yes

## 2020-05-18 ENCOUNTER — Inpatient Hospital Stay (HOSPITAL_COMMUNITY): Payer: HRSA Program

## 2020-05-18 LAB — COMPREHENSIVE METABOLIC PANEL
ALT: 90 U/L — ABNORMAL HIGH (ref 0–44)
AST: 128 U/L — ABNORMAL HIGH (ref 15–41)
Albumin: 3 g/dL — ABNORMAL LOW (ref 3.5–5.0)
Alkaline Phosphatase: 60 U/L (ref 38–126)
Anion gap: 12 (ref 5–15)
BUN: 26 mg/dL — ABNORMAL HIGH (ref 6–20)
CO2: 26 mmol/L (ref 22–32)
Calcium: 8.3 mg/dL — ABNORMAL LOW (ref 8.9–10.3)
Chloride: 99 mmol/L (ref 98–111)
Creatinine, Ser: 1.45 mg/dL — ABNORMAL HIGH (ref 0.61–1.24)
GFR calc Af Amer: >60 mL/min (ref >60)
GFR calc non Af Amer: >60 mL/min (ref >60)
Glucose, Bld: 194 mg/dL — ABNORMAL HIGH (ref 70–99)
Potassium: 3.9 mmol/L (ref 3.5–5.1)
Sodium: 137 mmol/L (ref 135–145)
Total Bilirubin: 1.2 mg/dL (ref 0.3–1.2)
Total Protein: 6.2 g/dL — ABNORMAL LOW (ref 6.5–8.1)

## 2020-05-18 LAB — CBC WITH DIFFERENTIAL/PLATELET
Abs Immature Granulocytes: 0.45 10*3/uL — ABNORMAL HIGH (ref 0.00–0.07)
Basophils Absolute: 0.1 10*3/uL (ref 0.0–0.1)
Basophils Relative: 1 %
Eosinophils Absolute: 0 10*3/uL (ref 0.0–0.5)
Eosinophils Relative: 0 %
HCT: 46.1 % (ref 39.0–52.0)
Hemoglobin: 15.7 g/dL (ref 13.0–17.0)
Immature Granulocytes: 3 %
Lymphocytes Relative: 7 %
Lymphs Abs: 1 10*3/uL (ref 0.7–4.0)
MCH: 27.8 pg (ref 26.0–34.0)
MCHC: 34.1 g/dL (ref 30.0–36.0)
MCV: 81.7 fL (ref 80.0–100.0)
Monocytes Absolute: 1.8 10*3/uL — ABNORMAL HIGH (ref 0.1–1.0)
Monocytes Relative: 12 %
Neutro Abs: 12 10*3/uL — ABNORMAL HIGH (ref 1.7–7.7)
Neutrophils Relative %: 77 %
Platelets: 348 10*3/uL (ref 150–400)
RBC: 5.64 MIL/uL (ref 4.22–5.81)
RDW: 13 % (ref 11.5–15.5)
WBC: 15.3 10*3/uL — ABNORMAL HIGH (ref 4.0–10.5)
nRBC: 0 % (ref 0.0–0.2)

## 2020-05-18 LAB — PROCALCITONIN: Procalcitonin: <0.1 ng/mL

## 2020-05-18 LAB — D-DIMER, QUANTITATIVE: D-Dimer, Quant: 0.87 ug/mL-FEU — ABNORMAL HIGH (ref 0.00–0.50)

## 2020-05-18 LAB — MAGNESIUM: Magnesium: 2.5 mg/dL — ABNORMAL HIGH (ref 1.7–2.4)

## 2020-05-18 LAB — LIPASE, BLOOD: Lipase: 50 U/L (ref 11–51)

## 2020-05-18 LAB — C-REACTIVE PROTEIN: CRP: 1.6 mg/dL — ABNORMAL HIGH (ref <1.0)

## 2020-05-18 MED ORDER — TAMSULOSIN HCL 0.4 MG PO CAPS
0.4000 mg | ORAL_CAPSULE | Freq: Every day | ORAL | Status: DC
  Administered 2020-05-18 – 2020-05-23 (×6): 0.4 mg via ORAL
  Filled 2020-05-18 (×6): qty 1

## 2020-05-18 MED ORDER — SODIUM CHLORIDE 0.45 % IV SOLN: INTRAVENOUS | Status: DC

## 2020-05-18 MED ORDER — IOHEXOL 9 MG/ML PO SOLN
500.0000 mL | Freq: Once | ORAL | Status: AC
  Administered 2020-05-18: 500 mL via ORAL

## 2020-05-18 MED ORDER — ENSURE ENLIVE PO LIQD
237.0000 mL | Freq: Two times a day (BID) | ORAL | Status: DC
  Administered 2020-05-18 – 2020-05-23 (×4): 237 mL via ORAL
  Filled 2020-05-18: qty 237

## 2020-05-18 NOTE — Progress Notes (Addendum)
PROGRESS NOTE  Brian Mack QZR:[id number] DOB: 01-09-1986 DOA: 05/28/2020  PCP: Patient, No Pcp Per  Brief History/Interval Summary: 34 year old Caucasian male with no significant past medical history except for obesity who came in with worsening cough shortness of breath.  He previously has had nausea and diarrhea.  He was noted to be hypoxic.  Hospitalized for further management.  Reason for Visit: Acute respiratory failure with hypoxia.  Pneumonia due to COVID-19.  Consultants: Seen by pulmonology on 9/2  Procedures: None  Antibiotics: Anti-infectives (From admission, onward)   Start     Dose/Rate Route Frequency Ordered Stop   05/16/20 1000  remdesivir 100 mg in sodium chloride 0.9 % 100 mL IVPB        100 mg 200 mL/hr over 30 Minutes Intravenous Daily 06/10/2020 0517     06/06/2020 0915  cefTRIAXone (ROCEPHIN) 2 g in sodium chloride 0.9 % 100 mL IVPB        2 g 200 mL/hr over 30 Minutes Intravenous Every 24 hours 05/29/2020 0909 05/20/20 0914   05/30/2020 0915  azithromycin (ZITHROMAX) 500 mg in sodium chloride 0.9 % 250 mL IVPB        500 mg 250 mL/hr over 60 Minutes Intravenous Every 24 hours 06/12/2020 0909 05/20/20 0914   06/12/2020 0530  remdesivir 200 mg in sodium chloride 0.9% 250 mL IVPB        200 mg 580 mL/hr over 30 Minutes Intravenous Once 06/01/2020 0517 05/26/2020 0746      Subjective/Interval History: Patient not a very good historian.  Does not communicate much but states that his shortness of breath is about the same.  He has been nauseated and has had a couple episodes of vomiting.  Complains of abdominal pain again today.  Continues to have a cough.      Assessment/Plan:  Acute Hypoxic Resp. Failure/Pneumonia due to COVID-19/sepsis ruled out   Recent Labs  Lab 05/29/2020 0414 05/16/20 0408 05/17/20 0525 05/18/20 0214  DDIMER 2.38* 1.73* 1.05* 0.87*  FERRITIN 1,488* 1,518*  --   --   CRP 10.4* 10.3* 3.5* 1.6*  ALT 39 38 37 90*  PROCALCITON 1.15  --   0.19 <0.10    Objective findings: Oxygen requirements: Remains on heated high flow [place] but noted to be at 20 L/min, for 80% FiO2.  Also on a nonrebreather.  Saturating in the early 90s.    COVID 19 Therapeutics: Antibacterials: Ceftriaxone and azithromycin.  Procalcitonin was 1.15. Remdesivir: Day 4 Steroids: Solu-Medrol Diuretics: None currently Actemra/Baricitinib: Started on baricitinib on 9/2 PUD Prophylaxis: Protonix DVT Prophylaxis:  Lovenox 60 mg daily  From a respiratory standpoint patient remains tenuous although his oxygen requirements seem to be decreasing.  Still on quite a bit of oxygen at this time.  Continue with Remdesivir steroids and baricitinib.  Also on antibacterials due to elevated procalcitonin which has improved.  D-dimer has improved.  CRP is also better.  Patient apparently has not been mobilizing much.  He needs to be out of bed to chair.  Apparently has refused nursing staff when they have attempted to do this.  Chest x-ray from yesterday showed stable findings.  The treatment plan and use of medications and known side effects were discussed with patient. Some of the medications used are based on case reports/anecdotal data.  All other medications being used in the management of COVID-19 based on limited study data.  Complete risks and long-term side effects are unknown, however in the best clinical judgment they seem  to be of some benefit.  Patient wanted to proceed with treatment options provided.  Abdominal pain Patient very poor historian.  He mentioned abdominal pain 2 days ago.  He was placed on PPI as the pain was thought to be due to gastritis.  Abdomen was benign.  However his symptoms have not improved.  He is still tender diffusely.  We will proceed with CT scan of the abdomen pelvis.  LFTs are noted to be elevated but it could be due to COVID-19 and Remdesivir as well as baricitinib.  Leukocytosis most likely due to steroids.  Check lipase.  ADDENDUM: CT  scan shows punctate stone in mid-ureter with mild right hydronephrosis. Discussed with Dr. Mena GoesEskridge. Stone will likely pass on its own. Recommends IVF and flomax. Will check UA. Patient already on antibiotics. No clear indication for procedures currently as patient not septic plus he will be high risk considering his severe respiratory disease.  Acute kidney injury Baseline creatinine is not known.  Presented with a creatinine of 1.79.  Has been fluctuating.  Improved to 1.26 and then climbed to 1.510.  Noted to be 1.45 today.  Monitor urine output.  Avoid nephrotoxic agents.    Hyponatremia Likely due to hypovolemia.  Improved.  Hypokalemia Repleted.  Magnesium is 2.6.  Transaminitis Secondary to COVID-19.  Continue to monitor.  Lactic acidosis Due to hypovolemia.  Improved from 2.4-1.7.  Obesity Estimated body mass index is 37.7 kg/m as calculated from the following:   Height as of this encounter: 5\' 9"  (1.753 m).   Weight as of this encounter: 115.8 kg.   DVT Prophylaxis: Lovenox Code Status: Full code Family Communication: Discussed with the patient.  He will inform his significant other Disposition Plan: Hopefully return home when improved  Status is: Inpatient  Remains inpatient appropriate because:IV treatments appropriate due to intensity of illness or inability to take PO and Inpatient level of care appropriate due to severity of illness   Dispo:  Patient From: Home  Planned Disposition: Home  Expected discharge date: 05/20/20  Medically stable for discharge: No       Medications:  Scheduled: . baricitinib  4 mg Oral Daily  . enoxaparin (LOVENOX) injection  60 mg Subcutaneous Q24H  . feeding supplement (ENSURE ENLIVE)  237 mL Oral BID BM  . methylPREDNISolone (SOLU-MEDROL) injection  0.5 mg/kg Intravenous Q12H  . pantoprazole  40 mg Oral Q1200  . sodium chloride flush  3 mL Intravenous Q12H   Continuous: . sodium chloride    . azithromycin 500 mg  (05/18/20 0810)  . cefTRIAXone (ROCEPHIN)  IV 2 g (05/18/20 [id number])  . remdesivir 100 mg in NS 100 mL 100 mg (05/18/20 0811)   NGE:XBMWUXPRN:sodium chloride, acetaminophen, albuterol, chlorpheniramine-HYDROcodone, guaiFENesin-dextromethorphan, lip balm, ondansetron **OR** ondansetron (ZOFRAN) IV, oxyCODONE, sodium chloride, sodium chloride flush   Objective:  Vital Signs  Vitals:   05/18/20 0405 05/18/20 0801 05/18/20 1056 05/18/20 1152  BP: 138/87 (!) 137/92  (!) 121/59  Pulse: 70 70 80 67  Resp: (!) 24 20 (!) 21 20  Temp: 98.2 F (36.8 C) 98.4 F (36.9 C)  98.3 F (36.8 C)  TempSrc: Oral Oral  Oral  SpO2: 92% (!) 88% (!) 88% 94%  Weight:      Height:        Intake/Output Summary (Last 24 hours) at 05/18/2020 1310 Last data filed at 05/18/2020 0917 Gross per 24 hour  Intake 810 ml  Output 925 ml  Net -115 ml   [employer] PowerFiled Weights  05/16/20 0257  Weight: 115.8 kg    General appearance: Awake alert.  In no distress Resp: Diminished air entry at the bases.  Noted to be tachypneic without use of accessory muscles.  Few crackles.  No wheezing or rhonchi.   Cardio: S1-S2 is normal regular.  No S3-S4.  No rubs murmurs or bruit GI: Abdomen is soft.  Tender diffusely but no guarding rebound rigidity appreciated.  Bowel sounds present.  No obvious masses organomegaly. Extremities: No edema.  Full range of motion of lower extremities. Neurologic: Alert and oriented x3.  No focal neurological deficits.    Lab Results:  Data Reviewed: I have personally reviewed following labs and imaging studies  CBC: Recent Labs  Lab 06/11/2020 0414 05/16/20 0408 05/17/20 0525 05/18/20 0214  WBC 14.4* 9.9 14.7* 15.3*  NEUTROABS 11.9* 6.8 12.3* 12.0*  HGB 17.6* 15.9 15.9 15.7  HCT 52.5* 47.4 47.3 46.1  MCV 80.8 80.2 82.0 81.7  PLT 226 235 335 348    Basic Metabolic Panel: Recent Labs  Lab 06/01/2020 0414 05/16/20 0408 05/17/20 0525 05/18/20 0214  NA 131* 134* 136 137  K 3.7 3.4* 3.6 3.9  CL 96*  100 102 99  CO2 16* 21* 24 26  GLUCOSE 137* 138* 162* 194*  BUN 23* 22* 26* 26*  CREATININE 1.79* 1.26* 1.52* 1.45*  CALCIUM 8.4* 8.4* 8.5* 8.3*  MG  --  2.3 2.6* 2.5*  PHOS  --  2.8  --   --     GFR: Estimated Creatinine Clearance: 90.9 mL/min (A) (by C-G formula based on SCr of 1.45 mg/dL (H)).  Liver Function Tests: Recent Labs  Lab 05/22/2020 0414 05/16/20 0408 05/17/20 0525 05/18/20 0214  AST 140* 94* 58* 128*  ALT 39 38 37 90*  ALKPHOS 75 64 60 60  BILITOT 0.9 0.8 1.2 1.2  PROT 7.6 6.6 6.6 6.2*  ALBUMIN 3.1* 2.8* 2.9* 3.0*      Anemia Panel: Recent Labs    05/16/20 0408  FERRITIN 1,518*    Recent Results (from the past 240 hour(s))  SARS Coronavirus 2 by RT PCR (hospital order, performed in [facility] hospital lab) Nasopharyngeal Nasopharyngeal Swab     Status: Abnormal   Collection Time: 05/11/20  3:53 PM   Specimen: Nasopharyngeal Swab  Result Value Ref Range Status   SARS Coronavirus 2 POSITIVE (A) NEGATIVE Final    Comment: RESULT CALLED TO, READ BACK BY AND VERIFIED WITH: Netta Corrigan RN 1742 05/11/20 JM (NOTE) SARS-CoV-2 target nucleic acids are DETECTED  SARS-CoV-2 RNA is generally detectable in upper respiratory specimens  during the acute phase of infection.  Positive results are indicative  of the presence of the identified virus, but do not rule out bacterial infection or co-infection with other pathogens not detected by the test.  Clinical correlation with patient history and  other diagnostic information is necessary to determine patient infection status.  The expected result is negative.  Fact Sheet for Patients:   BoilerBrush.com.cy   Fact Sheet for Healthcare Providers:   https://pope.com/    This test is not yet approved or cleared by the [place] FDA and  has been authorized for detection and/or diagnosis of SARS-CoV-2 by FDA under an Emergency Use Authorization (EUA).  This EUA  will remain in effect (meaning this test can  be used) for the duration of  the COVID-19 declaration under Section 564(b)(1) of the Act, 21 U.S.C. section 360-bbb-3(b)(1), unless the authorization is terminated or revoked sooner.  Performed at  [facility], [address] [place], [place] [address]   Blood Culture (routine x 2)     Status: None (Preliminary result)   Collection Time: 05/20/2020  4:37 AM   Specimen: BLOOD  Result Value Ref Range Status   Specimen Description BLOOD RIGHT ANTECUBITAL  Final   Special Requests   Final    BOTTLES DRAWN AEROBIC AND ANAEROBIC Blood Culture adequate volume   Culture   Final    NO GROWTH 3 DAYS Performed at [facility], [address] [place], [place] [address]    Report Status PENDING  Incomplete  Blood Culture (routine x 2)     Status: None (Preliminary result)   Collection Time: May 20, 2020  5:20 AM   Specimen: BLOOD LEFT HAND  Result Value Ref Range Status   Specimen Description BLOOD LEFT HAND  Final   Special Requests   Final    BOTTLES DRAWN AEROBIC AND ANAEROBIC Blood Culture adequate volume   Culture   Final    NO GROWTH 3 DAYS Performed at [facility], [address], [place], [place] [address]    Report Status PENDING  Incomplete      Radiology Studies: DG Chest Port 1 View  Result Date: 05/17/2020 CLINICAL DATA:  Positive COVID-19. Short of breath. Follow-up exam. EXAM: PORTABLE CHEST 1 VIEW COMPARISON:  2020-05-20 FINDINGS: Lung volumes remain low. Hazy airspace opacities are again noted in the mid and lower lungs, unchanged from the prior exam. No new lung abnormalities. No pleural effusion or pneumothorax. Cardiac silhouette is normal in size. IMPRESSION: 1. Stable appearance from the previous exam with bilateral mid and lower lung zone hazy airspace opacities consistent with atypical/viral pneumonia. Electronically Signed   By: Amie Portland M.D.   On: 05/17/2020 10:21       LOS: 3 days    Brian Mack Rito Ehrlich  [facility] Pager on [email or web address]  05/18/2020, 1:10 PM

## 2020-05-19 DIAGNOSIS — R1084 Generalized abdominal pain: Secondary | ICD-10-CM

## 2020-05-19 LAB — CBC WITH DIFFERENTIAL/PLATELET
Abs Immature Granulocytes: 0.85 10*3/uL — ABNORMAL HIGH (ref 0.00–0.07)
Basophils Absolute: 0.1 10*3/uL (ref 0.0–0.1)
Basophils Relative: 0 %
Eosinophils Absolute: 0 10*3/uL (ref 0.0–0.5)
Eosinophils Relative: 0 %
HCT: 42.2 % (ref 39.0–52.0)
Hemoglobin: 14 g/dL (ref 13.0–17.0)
Immature Granulocytes: 5 %
Lymphocytes Relative: 4 %
Lymphs Abs: 0.8 10*3/uL (ref 0.7–4.0)
MCH: 27 pg (ref 26.0–34.0)
MCHC: 33.2 g/dL (ref 30.0–36.0)
MCV: 81.5 fL (ref 80.0–100.0)
Monocytes Absolute: 1.6 10*3/uL — ABNORMAL HIGH (ref 0.1–1.0)
Monocytes Relative: 9 %
Neutro Abs: 14.9 10*3/uL — ABNORMAL HIGH (ref 1.7–7.7)
Neutrophils Relative %: 82 %
Platelets: 362 10*3/uL (ref 150–400)
RBC: 5.18 MIL/uL (ref 4.22–5.81)
RDW: 12.5 % (ref 11.5–15.5)
WBC: 18.2 10*3/uL — ABNORMAL HIGH (ref 4.0–10.5)
nRBC: 0 % (ref 0.0–0.2)

## 2020-05-19 LAB — COMPREHENSIVE METABOLIC PANEL
ALT: 99 U/L — ABNORMAL HIGH (ref 0–44)
AST: 76 U/L — ABNORMAL HIGH (ref 15–41)
Albumin: 2.6 g/dL — ABNORMAL LOW (ref 3.5–5.0)
Alkaline Phosphatase: 56 U/L (ref 38–126)
Anion gap: 8 (ref 5–15)
BUN: 21 mg/dL — ABNORMAL HIGH (ref 6–20)
CO2: 25 mmol/L (ref 22–32)
Calcium: 7.7 mg/dL — ABNORMAL LOW (ref 8.9–10.3)
Chloride: 104 mmol/L (ref 98–111)
Creatinine, Ser: 1.25 mg/dL — ABNORMAL HIGH (ref 0.61–1.24)
GFR calc Af Amer: >60 mL/min (ref >60)
GFR calc non Af Amer: >60 mL/min (ref >60)
Glucose, Bld: 201 mg/dL — ABNORMAL HIGH (ref 70–99)
Potassium: 3.8 mmol/L (ref 3.5–5.1)
Sodium: 137 mmol/L (ref 135–145)
Total Bilirubin: 0.5 mg/dL (ref 0.3–1.2)
Total Protein: 5.2 g/dL — ABNORMAL LOW (ref 6.5–8.1)

## 2020-05-19 LAB — MAGNESIUM: Magnesium: 2.5 mg/dL — ABNORMAL HIGH (ref 1.7–2.4)

## 2020-05-19 LAB — D-DIMER, QUANTITATIVE: D-Dimer, Quant: 0.79 ug/mL-FEU — ABNORMAL HIGH (ref 0.00–0.50)

## 2020-05-19 LAB — C-REACTIVE PROTEIN: CRP: 0.9 mg/dL (ref <1.0)

## 2020-05-19 MED ORDER — POLYETHYLENE GLYCOL 3350 17 G PO PACK
17.0000 g | PACK | Freq: Two times a day (BID) | ORAL | Status: DC
  Administered 2020-05-19 – 2020-05-23 (×3): 17 g via ORAL
  Filled 2020-05-19 (×7): qty 1

## 2020-05-19 NOTE — Progress Notes (Signed)
PROGRESS NOTE  Brian MinersSamuel Mack ZOX:[id number] DOB: 02/07/1986 DOA: 05/28/2020  PCP: Patient, No Pcp Per  Brief History/Interval Summary: 34 year old Caucasian male with no significant past medical history except for obesity who came in with worsening cough shortness of breath.  He previously has had nausea and diarrhea.  He was noted to be hypoxic.  Hospitalized for further management.  Reason for Visit: Acute respiratory failure with hypoxia.  Pneumonia due to COVID-19.  Consultants: Seen by pulmonology on 9/2  Procedures: None  Antibiotics: Anti-infectives (From admission, onward)   Start     Dose/Rate Route Frequency Ordered Stop   05/16/20 1000  remdesivir 100 mg in sodium chloride 0.9 % 100 mL IVPB        100 mg 200 mL/hr over 30 Minutes Intravenous Daily 05/26/2020 0517     06/02/2020 0915  cefTRIAXone (ROCEPHIN) 2 g in sodium chloride 0.9 % 100 mL IVPB        2 g 200 mL/hr over 30 Minutes Intravenous Every 24 hours 06/01/2020 0909 05/19/20 1126   05/27/2020 0915  azithromycin (ZITHROMAX) 500 mg in sodium chloride 0.9 % 250 mL IVPB        500 mg 250 mL/hr over 60 Minutes Intravenous Every 24 hours 05/31/2020 0909 05/20/20 0914   05/17/2020 0530  remdesivir 200 mg in sodium chloride 0.9% 250 mL IVPB        200 mg 580 mL/hr over 30 Minutes Intravenous Once 06/07/2020 0517 05/20/2020 0746      Subjective/Interval History: Patient not a very good historian but states that he is feeling slightly better today.  Abdominal pain is better.  Has not had any further episodes of vomiting since yesterday morning.      Assessment/Plan:  Acute Hypoxic Resp. Failure/Pneumonia due to COVID-19/sepsis ruled out   Recent Labs  Lab 06/01/2020 0414 05/16/20 0408 05/17/20 0525 05/18/20 0214 05/19/20 0328  DDIMER 2.38* 1.73* 1.05* 0.87* 0.79*  FERRITIN 1,488* 1,518*  --   --   --   CRP 10.4* 10.3* 3.5* 1.6* 0.9  ALT 39 38 37 90* 99*  PROCALCITON 1.15  --  0.19 <0.10  --     Objective  findings: Oxygen requirements: Heated high flow nasal cannula 34 L/min.  Saturating in the early 90s.    COVID 19 Therapeutics: Antibacterials: 5-day course of ceftriaxone and azithromycin.  Procalcitonin was 1.15. Remdesivir: Day 5 Steroids: Solu-Medrol Diuretics: None currently Actemra/Baricitinib: Started on baricitinib on 9/2 PUD Prophylaxis: Protonix DVT Prophylaxis:  Lovenox 60 mg daily  From a respiratory standpoint patient remains tenuous.  He continues to require 30 to 40 L of oxygen by heated high flow.  100% FiO2.  He is also on a nonrebreather.  Continue with Remdesivir which she will complete today.  Continue steroids and baricitinib.  He will also complete 5-day course of antibiotics.  D-dimer has improved.  CRP is also better.  Chest x-ray from day before yesterday showed stable findings.  We will repeat tomorrow morning.  Out of bed to chair.  Mobilize.  Patient has not been very compliant with mobility.  The treatment plan and use of medications and known side effects were discussed with patient. Some of the medications used are based on case reports/anecdotal data.  All other medications being used in the management of COVID-19 based on limited study data.  Complete risks and long-term side effects are unknown, however in the best clinical judgment they seem to be of some benefit.  Patient wanted to proceed with  treatment options provided.  Ureteral colic Patient noted to have abdominal tenderness yesterday.  He had episodes of vomiting.  Very poor historian.  CT scan was subsequently done which showed a punctate stone in the mid right ureter with a mild right hydronephrosis.  This was discussed with Dr. Mena Goes with urology who recommended IV fluids and Flomax.  Patient already on antibacterials.  No indication for any urological procedures currently as he is not septic plus he will be high risk considering his severe respiratory disease.  Hopefully he will pass the stone on his  own.    Acute kidney injury likely due to obstruction Baseline creatinine is not known.  Stone was found in his right mid ureter as discussed above.  IV fluids were initiated.  Flomax was ordered.  Creatinine improved to 1.25 today.  Recheck labs tomorrow.  Monitor urine output.  Avoid nephrotoxic agents.    Hyponatremia Likely due to hypovolemia.  Improved.  Hypokalemia Repleted.  Magnesium has been normal.  Transaminitis Secondary to COVID-19.  Continue to monitor.  LFTs are stable.  Lactic acidosis Due to hypovolemia.  Improved from 2.4-1.7.  Obesity Estimated body mass index is 37.7 kg/m as calculated from the following:   Height as of this encounter: 5\' 9"  (1.753 m).   Weight as of this encounter: 115.8 kg.   DVT Prophylaxis: Lovenox Code Status: Full code Family Communication: Discussed with the patient.  He will inform his significant other Disposition Plan: Hopefully return home when improved  Status is: Inpatient  Remains inpatient appropriate because:IV treatments appropriate due to intensity of illness or inability to take PO and Inpatient level of care appropriate due to severity of illness   Dispo:  Patient From: Home  Planned Disposition: Home  Expected discharge date: 05/20/20  Medically stable for discharge: No       Medications:  Scheduled: . baricitinib  4 mg Oral Daily  . enoxaparin (LOVENOX) injection  60 mg Subcutaneous Q24H  . feeding supplement (ENSURE ENLIVE)  237 mL Oral BID BM  . methylPREDNISolone (SOLU-MEDROL) injection  0.5 mg/kg Intravenous Q12H  . pantoprazole  40 mg Oral Q1200  . polyethylene glycol  17 g Oral BID  . sodium chloride flush  3 mL Intravenous Q12H  . tamsulosin  0.4 mg Oral QPC supper   Continuous: . sodium chloride 75 mL/hr at 05/18/20 1802  . sodium chloride    . azithromycin 500 mg (05/19/20 1126)  . remdesivir 100 mg in NS 100 mL 100 mg (05/19/20 1006)   07/19/20 chloride, acetaminophen, albuterol,  chlorpheniramine-HYDROcodone, guaiFENesin-dextromethorphan, lip balm, ondansetron **OR** ondansetron (ZOFRAN) IV, oxyCODONE, sodium chloride, sodium chloride flush   Objective:  Vital Signs  Vitals:   05/19/20 0000 05/19/20 0300 05/19/20 0400 05/19/20 0733  BP: 129/85 129/85 (!) 104/57 (!) 124/91  Pulse: 92 75 63 93  Resp: (!) 23  (!) 22 (!) 22  Temp: 97.7 F (36.5 C)  98.1 F (36.7 C) 97.7 F (36.5 C)  TempSrc: Axillary  Axillary Axillary  SpO2: (!) 87% (!) 85% (!) 88% (!) 85%  Weight:      Height:        Intake/Output Summary (Last 24 hours) at 05/19/2020 1156 Last data filed at 05/18/2020 2340 Gross per 24 hour  Intake 247.3 ml  Output 600 ml  Net -352.7 ml   Filed Weights   05/16/20 0257  Weight: 115.8 kg    General appearance: Awake alert.  In no distress Resp: Tachypneic.  Use of accessory  muscles noted.  Crackles bilateral bases.  No wheezing or rhonchi. Cardio: S1-S2 is normal regular.  No S3-S4.  No rubs murmurs or bruit GI: Abdomen is soft.  Less tender today compared to yesterday.  No masses organomegaly.  Bowel sounds present.   Extremities: No edema.  Full range of motion of lower extremities. Neurologic: Alert and oriented x3.  No focal neurological deficits.    Lab Results:  Data Reviewed: I have personally reviewed following labs and imaging studies  CBC: Recent Labs  Lab 06/07/2020 0414 05/16/20 0408 05/17/20 0525 05/18/20 0214 05/19/20 0328  WBC 14.4* 9.9 14.7* 15.3* 18.2*  NEUTROABS 11.9* 6.8 12.3* 12.0* 14.9*  HGB 17.6* 15.9 15.9 15.7 14.0  HCT 52.5* 47.4 47.3 46.1 42.2  MCV 80.8 80.2 82.0 81.7 81.5  PLT 226 235 335 348 362    Basic Metabolic Panel: Recent Labs  Lab 05/24/2020 0414 05/16/20 0408 05/17/20 0525 05/18/20 0214 05/19/20 0328  NA 131* 134* 136 137 137  K 3.7 3.4* 3.6 3.9 3.8  CL 96* 100 102 99 104  CO2 16* 21* 24 26 25   GLUCOSE 137* 138* 162* 194* 201*  BUN 23* 22* 26* 26* 21*  CREATININE 1.79* 1.26* 1.52* 1.45* 1.25*    CALCIUM 8.4* 8.4* 8.5* 8.3* 7.7*  MG  --  2.3 2.6* 2.5* 2.5*  PHOS  --  2.8  --   --   --     GFR: Estimated Creatinine Clearance: 105.5 mL/min (A) (by C-G formula based on SCr of 1.25 mg/dL (H)).  Liver Function Tests: Recent Labs  Lab 05/30/2020 0414 05/16/20 0408 05/17/20 0525 05/18/20 0214 05/19/20 0328  AST 140* 94* 58* 128* 76*  ALT 39 38 37 90* 99*  ALKPHOS 75 64 60 60 56  BILITOT 0.9 0.8 1.2 1.2 0.5  PROT 7.6 6.6 6.6 6.2* 5.2*  ALBUMIN 3.1* 2.8* 2.9* 3.0* 2.6*     Recent Results (from the past 240 hour(s))  SARS Coronavirus 2 by RT PCR (hospital order, performed in [facility] hospital lab) Nasopharyngeal Nasopharyngeal Swab     Status: Abnormal   Collection Time: 05/11/20  3:53 PM   Specimen: Nasopharyngeal Swab  Result Value Ref Range Status   SARS Coronavirus 2 POSITIVE (A) NEGATIVE Final    Comment: RESULT CALLED TO, READ BACK BY AND VERIFIED WITH: 05/13/20 RN 1742 05/11/20 JM (NOTE) SARS-CoV-2 target nucleic acids are DETECTED  SARS-CoV-2 RNA is generally detectable in upper respiratory specimens  during the acute phase of infection.  Positive results are indicative  of the presence of the identified virus, but do not rule out bacterial infection or co-infection with other pathogens not detected by the test.  Clinical correlation with patient history and  other diagnostic information is necessary to determine patient infection status.  The expected result is negative.  Fact Sheet for Patients:   05/13/20   Fact Sheet for Healthcare Providers:   BoilerBrush.com.cy    This test is not yet approved or cleared by the https://pope.com/ FDA and  has been authorized for detection and/or diagnosis of SARS-CoV-2 by FDA under an Emergency Use Authorization (EUA).  This EUA will remain in effect (meaning this test can  be used) for the duration of  the COVID-19 declaration under Section 564(b)(1) of the Act,  21 U.S.C. section 360-bbb-3(b)(1), unless the authorization is terminated or revoked sooner.  Performed at [facility], [address], [place], [place]   Blood Culture (routine x 2)  Status: None (Preliminary result)   Collection Time: 05-20-2020  4:37 AM   Specimen: BLOOD  Result Value Ref Range Status   Specimen Description BLOOD RIGHT ANTECUBITAL  Final   Special Requests   Final    BOTTLES DRAWN AEROBIC AND ANAEROBIC Blood Culture adequate volume   Culture   Final    NO GROWTH 4 DAYS Performed at [facility], [address], [place], [place] [address]    Report Status PENDING  Incomplete  Blood Culture (routine x 2)     Status: None (Preliminary result)   Collection Time: 05/20/20  5:20 AM   Specimen: BLOOD LEFT HAND  Result Value Ref Range Status   Specimen Description BLOOD LEFT HAND  Final   Special Requests   Final    BOTTLES DRAWN AEROBIC AND ANAEROBIC Blood Culture adequate volume   Culture   Final    NO GROWTH 4 DAYS Performed at [facility], [address], [place], [place] [address]    Report Status PENDING  Incomplete      Radiology Studies: CT ABDOMEN PELVIS WO CONTRAST  Result Date: 05/18/2020 CLINICAL DATA:  Acute nonlocalized abdominal pain. Nausea and vomiting. COVID. EXAM: CT ABDOMEN AND PELVIS WITHOUT CONTRAST TECHNIQUE: Multidetector CT imaging of the abdomen and pelvis was performed following the standard protocol without IV contrast. COMPARISON:  None. FINDINGS: Lower chest: Diffuse ground-glass opacities in both lung bases consistent with COVID-19 pneumonia. No pleural fluid. Hepatobiliary: Borderline hepatic steatosis. No evidence of focal hepatic abnormality on noncontrast exam. Suspected intraluminal gallstone without pericholecystic inflammation or gallbladder wall thickening. There is no biliary dilatation. Pancreas: No ductal dilatation or inflammation. Spleen: Normal in size without focal abnormality.  Adrenals/Urinary Tract: No adrenal nodule. Mild right hydroureteronephrosis with punctate stone in the mid ureter, series 2, image 61. More distal ureters decompressed. Minimal right perinephric edema. There is no left hydronephrosis or stone. The left ureter is decompressed. Urinary bladder is unremarkable. Stomach/Bowel: Physiologically distended stomach. Normal positioning of the duodenum and ligament of Treitz. Normal no small bowel obstruction or inflammation. There is submucosal fatty infiltration involving the distal ileum and ascending colon suggesting prior or chronic inflammation. No acute inflammatory change. Normal appendix. Administered enteric contrast reaches the colon. There is no colonic wall thickening. No pericolonic fat stranding. Vascular/Lymphatic: Normal caliber abdominal aorta. No aortic aneurysm. No abdominopelvic adenopathy. Reproductive: Normal sized prostate gland with left prostatic calcifications. Other: No free air, free fluid, or intra-abdominal fluid collection. Tiny fat containing umbilical hernia. Musculoskeletal: There are no acute or suspicious osseous abnormalities. IMPRESSION: 1. Mild right hydroureteronephrosis with punctate stone in the mid ureter. 2. Diffuse ground-glass opacities in both lung bases consistent with COVID-19 pneumonia. 3. Suspected gallstone without CT findings of acute cholecystitis. 4. Submucosal fatty infiltration involving the distal ileum and ascending colon suggesting prior or chronic inflammation. No acute bowel inflammation. Electronically Signed   By: Narda Rutherford M.D.   On: 05/18/2020 17:36       LOS: 4 days   Brian Mack Rito Ehrlich  [facility] Pager on [email or web address]  05/19/2020, 11:56 AM

## 2020-05-19 NOTE — Progress Notes (Signed)
Respiratory paged about pt O2 sat ranging from high 70's to low 80's. Pt on 30 L HHF/ NRB/100% FiO2. RT recommended increasing flow to 40 and repositioning pt. Pt stable. Will continue to monitor

## 2020-05-20 ENCOUNTER — Inpatient Hospital Stay (HOSPITAL_COMMUNITY): Payer: HRSA Program

## 2020-05-20 DIAGNOSIS — J9601 Acute respiratory failure with hypoxia: Secondary | ICD-10-CM

## 2020-05-20 DIAGNOSIS — U071 COVID-19: Secondary | ICD-10-CM

## 2020-05-20 DIAGNOSIS — N23 Unspecified renal colic: Secondary | ICD-10-CM

## 2020-05-20 LAB — CBC WITH DIFFERENTIAL/PLATELET
Abs Immature Granulocytes: 1.15 10*3/uL — ABNORMAL HIGH (ref 0.00–0.07)
Basophils Absolute: 0.1 10*3/uL (ref 0.0–0.1)
Basophils Relative: 1 %
Eosinophils Absolute: 0 10*3/uL (ref 0.0–0.5)
Eosinophils Relative: 0 %
HCT: 47.1 % (ref 39.0–52.0)
Hemoglobin: 15.5 g/dL (ref 13.0–17.0)
Immature Granulocytes: 5 %
Lymphocytes Relative: 2 %
Lymphs Abs: 0.5 10*3/uL — ABNORMAL LOW (ref 0.7–4.0)
MCH: 27 pg (ref 26.0–34.0)
MCHC: 32.9 g/dL (ref 30.0–36.0)
MCV: 81.9 fL (ref 80.0–100.0)
Monocytes Absolute: 1.5 10*3/uL — ABNORMAL HIGH (ref 0.1–1.0)
Monocytes Relative: 7 %
Neutro Abs: 18.4 10*3/uL — ABNORMAL HIGH (ref 1.7–7.7)
Neutrophils Relative %: 85 %
Platelets: 434 10*3/uL — ABNORMAL HIGH (ref 150–400)
RBC: 5.75 MIL/uL (ref 4.22–5.81)
RDW: 12.6 % (ref 11.5–15.5)
WBC: 21.6 10*3/uL — ABNORMAL HIGH (ref 4.0–10.5)
nRBC: 0 % (ref 0.0–0.2)

## 2020-05-20 LAB — CULTURE, BLOOD (ROUTINE X 2)
Culture: NO GROWTH
Culture: NO GROWTH
Special Requests: ADEQUATE
Special Requests: ADEQUATE

## 2020-05-20 LAB — COMPREHENSIVE METABOLIC PANEL
ALT: 89 U/L — ABNORMAL HIGH (ref 0–44)
AST: 57 U/L — ABNORMAL HIGH (ref 15–41)
Albumin: 2.7 g/dL — ABNORMAL LOW (ref 3.5–5.0)
Alkaline Phosphatase: 58 U/L (ref 38–126)
Anion gap: 11 (ref 5–15)
BUN: 18 mg/dL (ref 6–20)
CO2: 26 mmol/L (ref 22–32)
Calcium: 7.9 mg/dL — ABNORMAL LOW (ref 8.9–10.3)
Chloride: 103 mmol/L (ref 98–111)
Creatinine, Ser: 1.14 mg/dL (ref 0.61–1.24)
GFR calc Af Amer: >60 mL/min (ref >60)
GFR calc non Af Amer: >60 mL/min (ref >60)
Glucose, Bld: 139 mg/dL — ABNORMAL HIGH (ref 70–99)
Potassium: 3.9 mmol/L (ref 3.5–5.1)
Sodium: 140 mmol/L (ref 135–145)
Total Bilirubin: 1.1 mg/dL (ref 0.3–1.2)
Total Protein: 5.9 g/dL — ABNORMAL LOW (ref 6.5–8.1)

## 2020-05-20 LAB — ECHOCARDIOGRAM LIMITED
Area-P 1/2: 4.21 cm2
Height: 69 in
S' Lateral: 2 cm
Weight: 4084.68 oz

## 2020-05-20 LAB — D-DIMER, QUANTITATIVE: D-Dimer, Quant: 0.68 ug/mL-FEU — ABNORMAL HIGH (ref 0.00–0.50)

## 2020-05-20 LAB — C-REACTIVE PROTEIN: CRP: 4.4 mg/dL — ABNORMAL HIGH (ref <1.0)

## 2020-05-20 LAB — MAGNESIUM: Magnesium: 2.5 mg/dL — ABNORMAL HIGH (ref 1.7–2.4)

## 2020-05-20 MED ORDER — FUROSEMIDE 10 MG/ML IJ SOLN
20.0000 mg | Freq: Once | INTRAMUSCULAR | Status: AC
  Administered 2020-05-20: 20 mg via INTRAVENOUS
  Filled 2020-05-20: qty 2

## 2020-05-20 NOTE — Progress Notes (Addendum)
PROGRESS NOTE  Brian Mack ZOX:[id number] DOB: 11/08/1985 DOA: 05/19/2020  PCP: Patient, No Pcp Per  Brief History/Interval Summary: 34 year old Caucasian male with no significant past medical history except for obesity who came in with worsening cough shortness of breath.  He previously has had nausea and diarrhea.  He was noted to be hypoxic.  Hospitalized for further management.  Reason for Visit: Acute respiratory failure with hypoxia.  Pneumonia due to COVID-19.  Consultants: Seen by pulmonology on 9/2  Procedures: None  Antibiotics: Anti-infectives (From admission, onward)   Start     Dose/Rate Route Frequency Ordered Stop   05/16/20 1000  remdesivir 100 mg in sodium chloride 0.9 % 100 mL IVPB        100 mg 200 mL/hr over 30 Minutes Intravenous Daily 05/17/2020 0517     05/28/2020 0915  cefTRIAXone (ROCEPHIN) 2 g in sodium chloride 0.9 % 100 mL IVPB        2 g 200 mL/hr over 30 Minutes Intravenous Every 24 hours 06/07/2020 0909 05/19/20 1126   05/14/2020 0915  azithromycin (ZITHROMAX) 500 mg in sodium chloride 0.9 % 250 mL IVPB        500 mg 250 mL/hr over 60 Minutes Intravenous Every 24 hours 06/08/2020 0909 05/19/20 1343   06/01/2020 0530  remdesivir 200 mg in sodium chloride 0.9% 250 mL IVPB        200 mg 580 mL/hr over 30 Minutes Intravenous Once 05/29/2020 0517 05/18/2020 0746      Subjective/Interval History: Patient not a very good historian but does admit that he is feeling slightly better.  Denies any abdominal pain today.  No nausea vomiting.  Shortness of breath is improving.      Assessment/Plan:  Acute Hypoxic Resp. Failure/Pneumonia due to COVID-19/sepsis ruled out   Recent Labs  Lab 06/11/2020 0414 05/21/2020 0414 05/16/20 0408 05/17/20 0525 05/18/20 0214 05/19/20 0328 05/20/20 0335  DDIMER 2.38*   < > 1.73* 1.05* 0.87* 0.79* 0.68*  FERRITIN 1,488*  --  1,518*  --   --   --   --   CRP 10.4*   < > 10.3* 3.5* 1.6* 0.9 4.4*  ALT 39   < > 38 37 90* 99* 89*    PROCALCITON 1.15  --   --  0.19 <0.10  --   --    < > = values in this interval not displayed.    Objective findings: Oxygen requirements: Heated high flow nasal cannula 35 L/min.  Saturating in the early 90s  COVID 19 Therapeutics: Antibacterials: Completed 5-day course of ceftriaxone and azithromycin.  Procalcitonin was 1.15. Remdesivir: Completed course on 9/6 Steroids: Solu-Medrol Diuretics: 1 dose of Lasix to be given today. Actemra/Baricitinib: Started on baricitinib on 9/2 PUD Prophylaxis: Protonix DVT Prophylaxis:  Lovenox 60 mg daily  From a respiratory standpoint patient remains tenuous.  Continues to require 30 to 40 L of oxygen via heated high flow.  100% FiO2.  Also requiring nonrebreather at times.  Patient has completed course of Remdesivir.  Remains on steroids and baricitinib.  He also completed a 5-day course of antibacterials.  D-dimer has improved.  CRP had improved and noted to be slightly higher today at 4.4.  Due to steroids.  Give 1 dose of furosemide as chest x-ray raises concern for fluid overload.  Echocardiogram.  Continue to mobilize.  Patient is sitting up in the chair today.  Incentive spirometry.  The treatment plan and use of medications and known side effects were discussed  with patient. Some of the medications used are based on case reports/anecdotal data.  All other medications being used in the management of COVID-19 based on limited study data.  Complete risks and long-term side effects are unknown, however in the best clinical judgment they seem to be of some benefit.  Patient wanted to proceed with treatment options provided.  Ureteral colic Patient noted to have abdominal tenderness.  He had episodes of vomiting.  CT scan was subsequently done which showed a punctate stone in the mid right ureter with a mild right hydronephrosis.  This was discussed with Dr. Mena GoesEskridge with urology who recommended IV fluids and Flomax.  Patient already on  antibacterials.  No indication for any urological procedures currently as he is not septic plus he will be high risk considering his severe respiratory disease.   Abdominal pain has improved.  Renal function has improved.  IV fluids were discontinued.   Hopefully he will pass the stone on his own.    Acute kidney injury likely due to obstruction Baseline creatinine is not known.  Stone was found in his right mid ureter as discussed above.  IV fluids were initiated.  Flomax was ordered.  Creatinine is improved and is normal today.  IV fluids were stopped.  Monitor urine output.    Hyponatremia Likely due to hypovolemia.  Improved.  Hypokalemia Repleted.  Magnesium has been normal.  Transaminitis Secondary to COVID-19.  Continue to monitor.  LFTs are stable.  Lactic acidosis Due to hypovolemia.  Improved from 2.4-1.7.  Obesity Estimated body mass index is 37.7 kg/m as calculated from the following:   Height as of this encounter: 5\' 9"  (1.753 m).   Weight as of this encounter: 115.8 kg.   DVT Prophylaxis: Lovenox Code Status: Full code Family Communication: Discussed with the patient.  He will inform his significant other Disposition Plan: Hopefully return home when improved  Status is: Inpatient  Remains inpatient appropriate because:IV treatments appropriate due to intensity of illness or inability to take PO and Inpatient level of care appropriate due to severity of illness   Dispo:  Patient From: Home  Planned Disposition: Home  Expected discharge date: 05/23/20  Medically stable for discharge: No       Medications:  Scheduled: . baricitinib  4 mg Oral Daily  . enoxaparin (LOVENOX) injection  60 mg Subcutaneous Q24H  . feeding supplement (ENSURE ENLIVE)  237 mL Oral BID BM  . methylPREDNISolone (SOLU-MEDROL) injection  0.5 mg/kg Intravenous Q12H  . pantoprazole  40 mg Oral Q1200  . polyethylene glycol  17 g Oral BID  . sodium chloride flush  3 mL Intravenous  Q12H  . tamsulosin  0.4 mg Oral QPC supper   Continuous: . sodium chloride    . remdesivir 100 mg in NS 100 mL 100 mg (05/20/20 1004)   YNW:GNFAOZPRN:sodium chloride, acetaminophen, albuterol, chlorpheniramine-HYDROcodone, guaiFENesin-dextromethorphan, lip balm, ondansetron **OR** ondansetron (ZOFRAN) IV, oxyCODONE, sodium chloride, sodium chloride flush   Objective:  Vital Signs  Vitals:   05/20/20 0400 05/20/20 0819 05/20/20 0822 05/20/20 1218  BP: (!) 142/89 121/75  128/73  Pulse: 81 89  92  Resp: (!) 24 20  (!) 24  Temp: 98.3 F (36.8 C) 98.7 F (37.1 C)  97.9 F (36.6 C)  TempSrc: Axillary Oral  Oral  SpO2: (!) 88% 95% 92% 90%  Weight:      Height:        Intake/Output Summary (Last 24 hours) at 05/20/2020 1252 Last data filed at  05/20/2020 1159 Gross per 24 hour  Intake 2080.94 ml  Output 1500 ml  Net 580.94 ml   Filed Weights   05/16/20 0257  Weight: 115.8 kg   General appearance: Awake alert.  In no distress Resp: Tachypneic.  Use of accessory muscles noted.  Crackles bilateral bases.  No wheezing or rhonchi. Cardio: S1-S2 is normal regular.  No S3-S4.  No rubs murmurs or bruit GI: Abdomen is soft.  Nontender nondistended.  Bowel sounds are present normal.  No masses organomegaly Extremities: No edema.  Full range of motion of lower extremities. Neurologic:  No focal neurological deficits.     Lab Results:  Data Reviewed: I have personally reviewed following labs and imaging studies  CBC: Recent Labs  Lab 05/16/20 0408 05/17/20 0525 05/18/20 0214 05/19/20 0328 05/20/20 0335  WBC 9.9 14.7* 15.3* 18.2* 21.6*  NEUTROABS 6.8 12.3* 12.0* 14.9* 18.4*  HGB 15.9 15.9 15.7 14.0 15.5  HCT 47.4 47.3 46.1 42.2 47.1  MCV 80.2 82.0 81.7 81.5 81.9  PLT 235 335 348 362 434*    Basic Metabolic Panel: Recent Labs  Lab 05/16/20 0408 05/17/20 0525 05/18/20 0214 05/19/20 0328 05/20/20 0335  NA 134* 136 137 137 140  K 3.4* 3.6 3.9 3.8 3.9  CL 100 102 99 104 103    CO2 21* 24 26 25 26   GLUCOSE 138* 162* 194* 201* 139*  BUN 22* 26* 26* 21* 18  CREATININE 1.26* 1.52* 1.45* 1.25* 1.14  CALCIUM 8.4* 8.5* 8.3* 7.7* 7.9*  MG 2.3 2.6* 2.5* 2.5* 2.5*  PHOS 2.8  --   --   --   --     GFR: Estimated Creatinine Clearance: 115.6 mL/min (by C-G formula based on SCr of 1.14 mg/dL).  Liver Function Tests: Recent Labs  Lab 05/16/20 0408 05/17/20 0525 05/18/20 0214 05/19/20 0328 05/20/20 0335  AST 94* 58* 128* 76* 57*  ALT 38 37 90* 99* 89*  ALKPHOS 64 60 60 56 58  BILITOT 0.8 1.2 1.2 0.5 1.1  PROT 6.6 6.6 6.2* 5.2* 5.9*  ALBUMIN 2.8* 2.9* 3.0* 2.6* 2.7*     Recent Results (from the past 240 hour(s))  SARS Coronavirus 2 by RT PCR (hospital order, performed in [facility] hospital lab) Nasopharyngeal Nasopharyngeal Swab     Status: Abnormal   Collection Time: 05/11/20  3:53 PM   Specimen: Nasopharyngeal Swab  Result Value Ref Range Status   SARS Coronavirus 2 POSITIVE (A) NEGATIVE Final    Comment: RESULT CALLED TO, READ BACK BY AND VERIFIED WITH: 05/13/20 RN [id number] 05/11/20 JM (NOTE) SARS-CoV-2 target nucleic acids are DETECTED  SARS-CoV-2 RNA is generally detectable in upper respiratory specimens  during the acute phase of infection.  Positive results are indicative  of the presence of the identified virus, but do not rule out bacterial infection or co-infection with other pathogens not detected by the test.  Clinical correlation with patient history and  other diagnostic information is necessary to determine patient infection status.  The expected result is negative.  Fact Sheet for Patients:   05/13/20   Fact Sheet for Healthcare Providers:   BoilerBrush.com.cy    This test is not yet approved or cleared by the https://pope.com/ FDA and  has been authorized for detection and/or diagnosis of SARS-CoV-2 by FDA under an Emergency Use Authorization (EUA).  This EUA will remain in effect  (meaning this test can  be used) for the duration of  the COVID-19 declaration under Section 564(b)(1) of the  Act, 21 U.S.C. section 360-bbb-3(b)(1), unless the authorization is terminated or revoked sooner.  Performed at [facility], [address] [place], [place] [address]   Blood Culture (routine x 2)     Status: None   Collection Time: 05/23/2020  4:37 AM   Specimen: BLOOD  Result Value Ref Range Status   Specimen Description BLOOD RIGHT ANTECUBITAL  Final   Special Requests   Final    BOTTLES DRAWN AEROBIC AND ANAEROBIC Blood Culture adequate volume   Culture   Final    NO GROWTH 5 DAYS Performed at [facility], [address] [place], [place] [address]    Report Status 05/20/2020 FINAL  Final  Blood Culture (routine x 2)     Status: None   Collection Time: 05/23/20  5:20 AM   Specimen: BLOOD LEFT HAND  Result Value Ref Range Status   Specimen Description BLOOD LEFT HAND  Final   Special Requests   Final    BOTTLES DRAWN AEROBIC AND ANAEROBIC Blood Culture adequate volume   Culture   Final    NO GROWTH 5 DAYS Performed at [facility], [address], [place], [place] [address]    Report Status 05/20/2020 FINAL  Final      Radiology Studies: CT ABDOMEN PELVIS WO CONTRAST  Result Date: 05/18/2020 CLINICAL DATA:  Acute nonlocalized abdominal pain. Nausea and vomiting. COVID. EXAM: CT ABDOMEN AND PELVIS WITHOUT CONTRAST TECHNIQUE: Multidetector CT imaging of the abdomen and pelvis was performed following the standard protocol without IV contrast. COMPARISON:  None. FINDINGS: Lower chest: Diffuse ground-glass opacities in both lung bases consistent with COVID-19 pneumonia. No pleural fluid. Hepatobiliary: Borderline hepatic steatosis. No evidence of focal hepatic abnormality on noncontrast exam. Suspected intraluminal gallstone without pericholecystic inflammation or gallbladder wall thickening. There is no biliary dilatation. Pancreas: No  ductal dilatation or inflammation. Spleen: Normal in size without focal abnormality. Adrenals/Urinary Tract: No adrenal nodule. Mild right hydroureteronephrosis with punctate stone in the mid ureter, series 2, image 61. More distal ureters decompressed. Minimal right perinephric edema. There is no left hydronephrosis or stone. The left ureter is decompressed. Urinary bladder is unremarkable. Stomach/Bowel: Physiologically distended stomach. Normal positioning of the duodenum and ligament of Treitz. Normal no small bowel obstruction or inflammation. There is submucosal fatty infiltration involving the distal ileum and ascending colon suggesting prior or chronic inflammation. No acute inflammatory change. Normal appendix. Administered enteric contrast reaches the colon. There is no colonic wall thickening. No pericolonic fat stranding. Vascular/Lymphatic: Normal caliber abdominal aorta. No aortic aneurysm. No abdominopelvic adenopathy. Reproductive: Normal sized prostate gland with left prostatic calcifications. Other: No free air, free fluid, or intra-abdominal fluid collection. Tiny fat containing umbilical hernia. Musculoskeletal: There are no acute or suspicious osseous abnormalities. IMPRESSION: 1. Mild right hydroureteronephrosis with punctate stone in the mid ureter. 2. Diffuse ground-glass opacities in both lung bases consistent with COVID-19 pneumonia. 3. Suspected gallstone without CT findings of acute cholecystitis. 4. Submucosal fatty infiltration involving the distal ileum and ascending colon suggesting prior or chronic inflammation. No acute bowel inflammation. Electronically Signed   By: Narda Rutherford M.D.   On: 05/18/2020 17:36   DG Chest Port 1 View  Result Date: 05/20/2020 CLINICAL DATA:  Pneumonia.  COVID. EXAM: PORTABLE CHEST 1 VIEW COMPARISON:  Chest x-ray 05/17/2020. FINDINGS: Cardiomegaly. Progressive severe diffuse bilateral interstitial prominence consistent with pneumonitis.  Interstitial edema/CHF cannot be excluded. Small bilateral pleural effusions. No pneumothorax. IMPRESSION: 1.  Cardiomegaly. 2. Progresses severe  diffuse bilateral interstitial prominence consistent with pneumonitis. Interstitial edema/CHF cannot be excluded. Small bilateral pleural effusions. Electronically Signed   By: Maisie Fus  Register   On: 05/20/2020 07:25       LOS: 5 days   Brian Mack  [facility] Pager on [email or web address]  05/20/2020, 12:52 PM

## 2020-05-20 NOTE — Progress Notes (Signed)
  Echocardiogram 2D Echocardiogram has been performed.  Delcie Roch 05/20/2020, 4:48 PM

## 2020-05-21 LAB — CBC
HCT: 44 % (ref 39.0–52.0)
Hemoglobin: 14.6 g/dL (ref 13.0–17.0)
MCH: 27.4 pg (ref 26.0–34.0)
MCHC: 33.2 g/dL (ref 30.0–36.0)
MCV: 82.7 fL (ref 80.0–100.0)
Platelets: 417 10*3/uL — ABNORMAL HIGH (ref 150–400)
RBC: 5.32 MIL/uL (ref 4.22–5.81)
RDW: 12.9 % (ref 11.5–15.5)
WBC: 24.4 10*3/uL — ABNORMAL HIGH (ref 4.0–10.5)
nRBC: 0 % (ref 0.0–0.2)

## 2020-05-21 LAB — COMPREHENSIVE METABOLIC PANEL
ALT: 68 U/L — ABNORMAL HIGH (ref 0–44)
AST: 45 U/L — ABNORMAL HIGH (ref 15–41)
Albumin: 2.6 g/dL — ABNORMAL LOW (ref 3.5–5.0)
Alkaline Phosphatase: 62 U/L (ref 38–126)
Anion gap: 10 (ref 5–15)
BUN: 18 mg/dL (ref 6–20)
CO2: 25 mmol/L (ref 22–32)
Calcium: 8 mg/dL — ABNORMAL LOW (ref 8.9–10.3)
Chloride: 100 mmol/L (ref 98–111)
Creatinine, Ser: 1.07 mg/dL (ref 0.61–1.24)
GFR calc Af Amer: >60 mL/min (ref >60)
GFR calc non Af Amer: >60 mL/min (ref >60)
Glucose, Bld: 136 mg/dL — ABNORMAL HIGH (ref 70–99)
Potassium: 4 mmol/L (ref 3.5–5.1)
Sodium: 135 mmol/L (ref 135–145)
Total Bilirubin: 0.7 mg/dL (ref 0.3–1.2)
Total Protein: 5.9 g/dL — ABNORMAL LOW (ref 6.5–8.1)

## 2020-05-21 LAB — BRAIN NATRIURETIC PEPTIDE: B Natriuretic Peptide: 62.6 pg/mL (ref 0.0–100.0)

## 2020-05-21 LAB — PROCALCITONIN: Procalcitonin: 0.11 ng/mL

## 2020-05-21 LAB — MRSA PCR SCREENING: MRSA by PCR: NEGATIVE

## 2020-05-21 MED ORDER — HYDROCORTISONE (PERIANAL) 2.5 % EX CREA
1.0000 "application " | TOPICAL_CREAM | Freq: Four times a day (QID) | CUTANEOUS | Status: DC | PRN
  Administered 2020-05-21: 1 via TOPICAL
  Filled 2020-05-21: qty 28.35

## 2020-05-21 MED ORDER — PHENOL 1.4 % MT LIQD
1.0000 | OROMUCOSAL | Status: DC | PRN
  Administered 2020-05-21: 1 via OROMUCOSAL
  Filled 2020-05-21: qty 177

## 2020-05-21 MED ORDER — ORAL CARE MOUTH RINSE
15.0000 mL | Freq: Two times a day (BID) | OROMUCOSAL | Status: DC
  Administered 2020-05-21 – 2020-05-24 (×8): 15 mL via OROMUCOSAL

## 2020-05-21 NOTE — Progress Notes (Signed)
   05/21/20 2004  Assess: MEWS Score  Temp 97.7 F (36.5 C)  BP 138/84  Pulse Rate 96  ECG Heart Rate 96  Resp 19  Level of Consciousness Alert  SpO2 (!) 84 %  O2 Device HFNC;Non-rebreather Mask  Patient Activity (if Appropriate) In chair  Heater temperature 93.2 F (34 C)  O2 Flow Rate (L/min) 30 L/min  FiO2 (%) 100 %  Assess: MEWS Score  MEWS Temp 0  MEWS Systolic 0  MEWS Pulse 0  MEWS RR 0  MEWS LOC 0  MEWS Score 0  MEWS Score Color Green  Assess: if the MEWS score is Yellow or Red  Were vital signs taken at a resting state? Yes  Focused Assessment No change from prior assessment  Early Detection of Sepsis Score *See Row Information* Low  MEWS guidelines implemented *See Row Information* No, other (Comment) (no acute changes)  Document  Progress note created (see row info) Yes

## 2020-05-21 NOTE — Progress Notes (Signed)
RRT had decreased patient's O2 to 25L HHFNC 90% FiO2. Patient tolerating well. This RN removed NRB to assess how patient would tolerate. SpO2 decreased as low as 88% while patient in bed. Patient anxious, stating he doesn't know if he's going to 'make it'. HR increased to 110s a while after NRB removed, so NRB re-applied to patient with additional 10L. Will continue to monitor.

## 2020-05-21 NOTE — Progress Notes (Signed)
PROGRESS NOTE  Brian Mack ZOX:[id number] DOB: October 11, 1985 DOA: 06/11/2020  PCP: Patient, No Pcp Per  Brief History/Interval Summary: 34 year old Caucasian male with no significant past medical history except for obesity who came in with worsening cough shortness of breath.  He previously has had nausea and diarrhea.  He was noted to be hypoxic.  Hospitalized for further management.  Reason for Visit: Acute respiratory failure with hypoxia.  Pneumonia due to COVID-19.  Consultants: Seen by pulmonology on 9/2  Procedures: None  Antibiotics: Anti-infectives (From admission, onward)   Start     Dose/Rate Route Frequency Ordered Stop   05/16/20 1000  remdesivir 100 mg in sodium chloride 0.9 % 100 mL IVPB        100 mg 200 mL/hr over 30 Minutes Intravenous Daily 05/27/2020 0517     06/08/2020 0915  cefTRIAXone (ROCEPHIN) 2 g in sodium chloride 0.9 % 100 mL IVPB        2 g 200 mL/hr over 30 Minutes Intravenous Every 24 hours 05/20/2020 0909 05/19/20 1126   05/29/2020 0915  azithromycin (ZITHROMAX) 500 mg in sodium chloride 0.9 % 250 mL IVPB        500 mg 250 mL/hr over 60 Minutes Intravenous Every 24 hours 06/08/2020 0909 05/19/20 1343   05/25/2020 0530  remdesivir 200 mg in sodium chloride 0.9% 250 mL IVPB        200 mg 580 mL/hr over 30 Minutes Intravenous Once 06/03/2020 0517 05/23/2020 0746      Subjective/Interval History:  Patient reports episodes of anxiety earlier today, he does report diarrhea, and he reports cough as well, no nausea, no vomiting .  Assessment/Plan:  Acute Hypoxic Resp. Failure/Pneumonia due to COVID-19/sepsis ruled out   Recent Labs  Lab 06/02/2020 0414 05/21/2020 0414 05/16/20 0408 05/16/20 0408 05/17/20 0525 05/18/20 0214 05/19/20 0328 05/20/20 0335 05/21/20 0533  DDIMER 2.38*   < > 1.73*  --  1.05* 0.87* 0.79* 0.68*  --   FERRITIN 1,488*  --  1,518*  --   --   --   --   --   --   CRP 10.4*   < > 10.3*  --  3.5* 1.6* 0.9 4.4*  --   ALT 39   < > 38   < >  37 90* 99* 89* 68*  PROCALCITON 1.15  --   --   --  0.19 <0.10  --   --   --    < > = values in this interval not displayed.    Objective findings: Oxygen requirements: He is on heated high flow nasal cannula 30 L/min with 15 L NRB. Marland Kitchen  COVID 19 Therapeutics: Antibacterials: Completed 5-day course of ceftriaxone and azithromycin.  Procalcitonin was 1.15. Remdesivir: Completed course on 9/6 Steroids: Solu-Medrol Diuretics: On Lasix as needed, will give 1 dose of Lasix today. Actemra/Baricitinib: Started on baricitinib on 9/2 PUD Prophylaxis: Protonix DVT Prophylaxis:  Lovenox 60 mg daily  From a respiratory standpoint patient remains tenuous.  He is high risk for intubation, he is on 30 L heated high flow nasal cannula on top of NRB as well,. -Discussed at length with the patient, he was encouraged to his incentive spirometry, flutter valve, and to get out of bed to chair . -He was treated with IV Remdesivir . -He remains on IV Solu-Medrol . -He is treated with baricitinib . - He also completed a 5-day course of antibacterials.  - he is on PRN lasix, will hold on lasix today. -  He is with leukocytosis today, most likely related to steroids, will check procalcitonin level.  Continue to mobilize.  Patient is sitting up in the chair today.  Incentive spirometry.  The treatment plan and use of medications and known side effects were discussed with patient. Some of the medications used are based on case reports/anecdotal data.  All other medications being used in the management of COVID-19 based on limited study data.  Complete risks and long-term side effects are unknown, however in the best clinical judgment they seem to be of some benefit.  Patient wanted to proceed with treatment options provided.  COVID-19 Labs  Recent Labs    05/19/20 0328 05/20/20 0335  DDIMER 0.79* 0.68*  CRP 0.9 4.4*    Lab Results  Component Value Date   SARSCOV2NAA POSITIVE (A) 05/11/2020     Ureteral  colic - Patient noted to have abdominal tenderness.  He had episodes of vomiting.  CT scan was subsequently done which showed a punctate stone in the mid right ureter with a mild right hydronephrosis.  This was discussed with Dr. Mena Goes with urology who recommended IV fluids and Flomax.  Patient already on antibacterials.  No indication for any urological procedures currently as he is not septic plus he will be high risk considering his severe respiratory disease.   Abdominal pain has improved.  Renal function has improved.  IV fluids were discontinued.   Hopefully he will pass the stone on his own.    Acute kidney injury likely due to obstruction Baseline creatinine is not known.  Stone was found in his right mid ureter as discussed above.  IV fluids were initiated.  Flomax was ordered.  Creatinine is improved and is normal today.  IV fluids were stopped.  Monitor urine output.    Hyponatremia Likely due to hypovolemia.  Improved.  Hypokalemia Repleted.  Magnesium has been normal.  Transaminitis Secondary to COVID-19.  Continue to monitor.  LFTs are stable.  Lactic acidosis Due to hypovolemia.  Improved from 2.4-1.7.  Obesity Estimated body mass index is 37.7 kg/m as calculated from the following:   Height as of this encounter:  (1.753 m).   Weight as of this encounter: 115.8 kg.   DVT Prophylaxis: Lovenox Code Status: Full code Family Communication: Discussed with the patient.  He will inform his significant other Disposition Plan: Hopefully return home when improved  Status is: Inpatient  Remains inpatient appropriate because:IV treatments appropriate due to intensity of illness or inability to take PO and Inpatient level of care appropriate due to severity of illness   Dispo:  Patient From: Home  Planned Disposition: Home  Expected discharge date: 05/30/20  Medically stable for discharge: No       Medications:  Scheduled: . baricitinib  4 mg Oral Daily  .  enoxaparin (LOVENOX) injection  60 mg Subcutaneous Q24H  . feeding supplement (ENSURE ENLIVE)  237 mL Oral BID BM  . mouth rinse  15 mL Mouth Rinse BID  . methylPREDNISolone (SOLU-MEDROL) injection  0.5 mg/kg Intravenous Q12H  . pantoprazole  40 mg Oral Q1200  . polyethylene glycol  17 g Oral BID  . sodium chloride flush  3 mL Intravenous Q12H  . tamsulosin  0.4 mg Oral QPC supper   Continuous: . sodium chloride    . remdesivir 100 mg in NS 100 mL 100 mg (05/21/20 0954)   ZOX:WRUEAV chloride, acetaminophen, albuterol, chlorpheniramine-HYDROcodone, guaiFENesin-dextromethorphan, lip balm, ondansetron **OR** ondansetron (ZOFRAN) IV, oxyCODONE, phenol, sodium chloride, sodium chloride  flush   Objective:  Vital Signs  Vitals:   05/21/20 0747 05/21/20 0750 05/21/20 0800 05/21/20 1159  BP: 136/78  125/87 137/82  Pulse: (!) 114 92 90 86  Resp: (!) 32 (!) 27 (!) 24 (!) 26  Temp:   97.8 F (36.6 C) 98.2 F (36.8 C)  TempSrc:   Axillary Axillary  SpO2: 92%  90% (!) 85%  Weight:      Height:        Intake/Output Summary (Last 24 hours) at 05/21/2020 1215 Last data filed at 05/21/2020 0856 Gross per 24 hour  Intake 426 ml  Output 2175 ml  Net -1749 ml   Filed Weights   05/16/20 0257  Weight: 115.8 kg   Awake Alert, Oriented X 3, No new F.N deficits, Normal affect Symmetrical Chest wall movement, diminished air entry at the bases, tachypneic RRR,No Gallops,Rubs or new Murmurs, No Parasternal Heave +ve B.Sounds, Abd Soft, No tenderness, No rebound - guarding or rigidity. No Cyanosis, Clubbing or edema, No new Rash or bruise       Lab Results:  Data Reviewed: I have personally reviewed following labs and imaging studies  CBC: Recent Labs  Lab 05/16/20 0408 05/16/20 0408 05/17/20 0525 05/18/20 0214 05/19/20 0328 05/20/20 0335 05/21/20 0533  WBC 9.9   < > 14.7* 15.3* 18.2* 21.6* 24.4*  NEUTROABS 6.8  --  12.3* 12.0* 14.9* 18.4*  --   HGB 15.9   < > 15.9 15.7 14.0 15.5  14.6  HCT 47.4   < > 47.3 46.1 42.2 47.1 44.0  MCV 80.2   < > 82.0 81.7 81.5 81.9 82.7  PLT 235   < > 335 348 362 434* 417*   < > = values in this interval not displayed.    Basic Metabolic Panel: Recent Labs  Lab 05/16/20 0408 05/16/20 0408 05/17/20 0525 05/18/20 0214 05/19/20 0328 05/20/20 0335 05/21/20 0533  NA 134*   < > 136 137 137 140 135  K 3.4*   < > 3.6 3.9 3.8 3.9 4.0  CL 100   < > 102 99 104 103 100  CO2 21*   < > 24 26 25 26 25   GLUCOSE 138*   < > 162* 194* 201* 139* 136*  BUN 22*   < > 26* 26* 21* 18 18  CREATININE 1.26*   < > 1.52* 1.45* 1.25* 1.14 1.07  CALCIUM 8.4*   < > 8.5* 8.3* 7.7* 7.9* 8.0*  MG 2.3  --  2.6* 2.5* 2.5* 2.5*  --   PHOS 2.8  --   --   --   --   --   --    < > = values in this interval not displayed.    GFR: Estimated Creatinine Clearance: 123.2 mL/min (by C-G formula based on SCr of 1.07 mg/dL).  Liver Function Tests: Recent Labs  Lab 05/17/20 0525 05/18/20 0214 05/19/20 0328 05/20/20 0335 05/21/20 0533  AST 58* 128* 76* 57* 45*  ALT 37 90* 99* 89* 68*  ALKPHOS 60 60 56 58 62  BILITOT 1.2 1.2 0.5 1.1 0.7  PROT 6.6 6.2* 5.2* 5.9* 5.9*  ALBUMIN 2.9* 3.0* 2.6* 2.7* 2.6*     Recent Results (from the past 240 hour(s))  SARS Coronavirus 2 by RT PCR (hospital order, performed in [facility] hospital lab) Nasopharyngeal Nasopharyngeal Swab     Status: Abnormal   Collection Time: 05/11/20  3:53 PM   Specimen: Nasopharyngeal Swab  Result Value Ref Range Status  SARS Coronavirus 2 POSITIVE (A) NEGATIVE Final    Comment: RESULT CALLED TO, READ BACK BY AND VERIFIED WITH: Netta CorriganZULETA, K RN [id number] 05/11/20 JM (NOTE) SARS-CoV-2 target nucleic acids are DETECTED  SARS-CoV-2 RNA is generally detectable in upper respiratory specimens  during the acute phase of infection.  Positive results are indicative  of the presence of the identified virus, but do not rule out bacterial infection or co-infection with other pathogens not detected by the  test.  Clinical correlation with patient history and  other diagnostic information is necessary to determine patient infection status.  The expected result is negative.  Fact Sheet for Patients:   BoilerBrush.com.cyhttps://www.fda.gov/media/136312/download   Fact Sheet for Healthcare Providers:   https://pope.com/https://www.fda.gov/media/136313/download    This test is not yet approved or cleared by the [place] FDA and  has been authorized for detection and/or diagnosis of SARS-CoV-2 by FDA under an Emergency Use Authorization (EUA).  This EUA will remain in effect (meaning this test can  be used) for the duration of  the COVID-19 declaration under Section 564(b)(1) of the Act, 21 U.S.C. section 360-bbb-3(b)(1), unless the authorization is terminated or revoked sooner.  Performed at [facility], [address] [place], [place] [address]   Blood Culture (routine x 2)     Status: None   Collection Time: 2019-10-04  4:37 AM   Specimen: BLOOD  Result Value Ref Range Status   Specimen Description BLOOD RIGHT ANTECUBITAL  Final   Special Requests   Final    BOTTLES DRAWN AEROBIC AND ANAEROBIC Blood Culture adequate volume   Culture   Final    NO GROWTH 5 DAYS Performed at [facility], [address] [place], [place] [address]    Report Status 05/20/2020 FINAL  Final  Blood Culture (routine x 2)     Status: None   Collection Time: 2019-10-04  5:20 AM   Specimen: BLOOD LEFT HAND  Result Value Ref Range Status   Specimen Description BLOOD LEFT HAND  Final   Special Requests   Final    BOTTLES DRAWN AEROBIC AND ANAEROBIC Blood Culture adequate volume   Culture   Final    NO GROWTH 5 DAYS Performed at [facility], [address] [place], [place] [address]    Report Status 05/20/2020 FINAL  Final  MRSA PCR Screening     Status: None   Collection Time: 05/20/20 10:56 PM   Specimen: Nasal Mucosa; Nasopharyngeal  Result Value Ref Range Status   MRSA by PCR NEGATIVE NEGATIVE  Final    Comment:        The GeneXpert MRSA Assay (FDA approved for NASAL specimens only), is one component of a comprehensive MRSA colonization surveillance program. It is not intended to diagnose MRSA infection nor to guide or monitor treatment for MRSA infections. Performed at [facility], [address] [place], [place] [address]       Radiology Studies: DG Chest Port 1 View  Result Date: 05/20/2020 CLINICAL DATA:  Pneumonia.  COVID. EXAM: PORTABLE CHEST 1 VIEW COMPARISON:  Chest x-ray 05/17/2020. FINDINGS: Cardiomegaly. Progressive severe diffuse bilateral interstitial prominence consistent with pneumonitis. Interstitial edema/CHF cannot be excluded. Small bilateral pleural effusions. No pneumothorax. IMPRESSION: 1.  Cardiomegaly. 2. Progresses severe diffuse bilateral interstitial prominence consistent with pneumonitis. Interstitial edema/CHF cannot be excluded. Small bilateral pleural effusions. Electronically Signed   By: Maisie Fushomas  Register   On: 05/20/2020 07:25   ECHOCARDIOGRAM LIMITED  Result Date: 05/20/2020  ECHOCARDIOGRAM LIMITED REPORT   Patient Name:   DANELL VERNO Date of Exam: 05/20/2020 Medical Rec #:  [id number]           Height:       69.0 in Accession #:    [id number]          Weight:       255.3 lb Date of Birth:  04-02-1986            BSA:          2.292 m Patient Age:    34 years            BP:           126/81 mmHg Patient Gender: M                   HR:           74 bpm. Exam Location:  Inpatient Procedure: 2D Echo Indications:   dyspnea 786.09  History:       Patient has no prior history of Echocardiogram examinations.                Covid.  Sonographer:   Delcie Roch Referring      [id number] BMZTA [id number] Phys:  Sonographer Comments: Image acquisition challenging due to respiratory motion. IMPRESSIONS  1. Left ventricular ejection fraction, by estimation, is 60 to 65%. The left ventricle has normal function. The left ventricle has no regional wall motion  abnormalities. Left ventricular diastolic function could not be evaluated.  2. Right ventricular systolic function is normal. The right ventricular size is normal. Tricuspid regurgitation signal is inadequate for assessing PA pressure.  3. The mitral valve is normal in structure. No evidence of mitral valve regurgitation. No evidence of mitral stenosis.  4. The aortic valve is normal in structure. Aortic valve regurgitation is not visualized. No aortic stenosis is present. FINDINGS  Left Ventricle: Left ventricular ejection fraction, by estimation, is 60 to 65%. The left ventricle has normal function. The left ventricle has no regional wall motion abnormalities. The left ventricular internal cavity size was normal in size. There is  no left ventricular hypertrophy. Right Ventricle: The right ventricular size is normal. No increase in right ventricular wall thickness. Right ventricular systolic function is normal. Tricuspid regurgitation signal is inadequate for assessing PA pressure. Left Atrium: Left atrial size was normal in size. Right Atrium: Right atrial size was normal in size. Pericardium: There is no evidence of pericardial effusion. Mitral Valve: The mitral valve is normal in structure. Normal mobility of the mitral valve leaflets. No evidence of mitral valve stenosis. Tricuspid Valve: The tricuspid valve is normal in structure. Tricuspid valve regurgitation is not demonstrated. No evidence of tricuspid stenosis. Aortic Valve: The aortic valve is normal in structure. Aortic valve regurgitation is not visualized. No aortic stenosis is present. Pulmonic Valve: The pulmonic valve was not well visualized. Aorta: The aortic root is normal in size and structure. Venous: The inferior vena cava was not well visualized. IAS/Shunts: No atrial level shunt detected by color flow Doppler. LEFT VENTRICLE PLAX 2D LVIDd:         3.80 cm LVIDs:         2.00 cm LV PW:         0.90 cm LV IVS:        0.90 cm LVOT diam:      2.40 cm LV SV:         85 LV SV Index:  37 LVOT Area:     4.52 cm  LEFT ATRIUM         Index LA diam:    3.10 cm 1.35 cm/m  AORTIC VALVE LVOT Vmax:   92.00 cm/s LVOT Vmean:  63.700 cm/s LVOT VTI:    0.188 m  AORTA Ao Root diam: 3.20 cm MITRAL VALVE MV Area (PHT): 4.21 cm    SHUNTS MV Decel Time: 180 msec    Systemic VTI:  0.19 m MV E velocity: 78.00 cm/s  Systemic Diam: 2.40 cm MV A velocity: 56.10 cm/s MV E/A ratio:  1.39 Armanda Magic MD Electronically signed by Armanda Magic MD Signature Date/Time: 05/20/2020/6:54:19 PM    Final        LOS: 6 days   Arnett Galindez  [facility] Pager on [email or web address]  05/21/2020, 12:15 PM

## 2020-05-21 NOTE — Plan of Care (Signed)

## 2020-05-22 ENCOUNTER — Inpatient Hospital Stay (HOSPITAL_COMMUNITY): Payer: HRSA Program

## 2020-05-22 LAB — CBC
HCT: 46.2 % (ref 39.0–52.0)
Hemoglobin: 15.2 g/dL (ref 13.0–17.0)
MCH: 27.6 pg (ref 26.0–34.0)
MCHC: 32.9 g/dL (ref 30.0–36.0)
MCV: 83.8 fL (ref 80.0–100.0)
Platelets: 433 10*3/uL — ABNORMAL HIGH (ref 150–400)
RBC: 5.51 MIL/uL (ref 4.22–5.81)
RDW: 13.2 % (ref 11.5–15.5)
WBC: 23.4 10*3/uL — ABNORMAL HIGH (ref 4.0–10.5)
nRBC: 0 % (ref 0.0–0.2)

## 2020-05-22 LAB — BLOOD GAS, ARTERIAL
Acid-Base Excess: 3.3 mmol/L — ABNORMAL HIGH (ref 0.0–2.0)
Bicarbonate: 27 mmol/L (ref 20.0–28.0)
FIO2: 100
O2 Saturation: 88 %
Patient temperature: 37
pCO2 arterial: 38.9 mmHg (ref 32.0–48.0)
pH, Arterial: 7.457 — ABNORMAL HIGH (ref 7.350–7.450)
pO2, Arterial: 54.7 mmHg — ABNORMAL LOW (ref 83.0–108.0)

## 2020-05-22 LAB — COMPREHENSIVE METABOLIC PANEL
ALT: 51 U/L — ABNORMAL HIGH (ref 0–44)
AST: 35 U/L (ref 15–41)
Albumin: 2.6 g/dL — ABNORMAL LOW (ref 3.5–5.0)
Alkaline Phosphatase: 59 U/L (ref 38–126)
Anion gap: 11 (ref 5–15)
BUN: 17 mg/dL (ref 6–20)
CO2: 24 mmol/L (ref 22–32)
Calcium: 8 mg/dL — ABNORMAL LOW (ref 8.9–10.3)
Chloride: 101 mmol/L (ref 98–111)
Creatinine, Ser: 1.06 mg/dL (ref 0.61–1.24)
GFR calc Af Amer: >60 mL/min (ref >60)
GFR calc non Af Amer: >60 mL/min (ref >60)
Glucose, Bld: 116 mg/dL — ABNORMAL HIGH (ref 70–99)
Potassium: 4.9 mmol/L (ref 3.5–5.1)
Sodium: 136 mmol/L (ref 135–145)
Total Bilirubin: 0.7 mg/dL (ref 0.3–1.2)
Total Protein: 5.7 g/dL — ABNORMAL LOW (ref 6.5–8.1)

## 2020-05-22 LAB — D-DIMER, QUANTITATIVE: D-Dimer, Quant: 0.53 ug/mL-FEU — ABNORMAL HIGH (ref 0.00–0.50)

## 2020-05-22 LAB — GLUCOSE, CAPILLARY: Glucose-Capillary: 117 mg/dL — ABNORMAL HIGH (ref 70–99)

## 2020-05-22 LAB — C-REACTIVE PROTEIN: CRP: 8.4 mg/dL — ABNORMAL HIGH (ref <1.0)

## 2020-05-22 LAB — PROCALCITONIN: Procalcitonin: <0.1 ng/mL

## 2020-05-22 MED ORDER — WITCH HAZEL-GLYCERIN EX PADS
MEDICATED_PAD | CUTANEOUS | Status: DC | PRN
  Filled 2020-05-22: qty 100

## 2020-05-22 MED ORDER — CHLORHEXIDINE GLUCONATE CLOTH 2 % EX PADS
6.0000 | MEDICATED_PAD | Freq: Every day | CUTANEOUS | Status: DC
  Administered 2020-05-22 – 2020-06-15 (×23): 6 via TOPICAL

## 2020-05-22 MED ORDER — LOPERAMIDE HCL 2 MG PO CAPS: 2.0000 mg | ORAL_CAPSULE | ORAL | Status: DC | PRN

## 2020-05-22 MED ORDER — FUROSEMIDE 10 MG/ML IJ SOLN
40.0000 mg | Freq: Once | INTRAMUSCULAR | Status: AC
  Administered 2020-05-22: 40 mg via INTRAVENOUS
  Filled 2020-05-22: qty 4

## 2020-05-22 NOTE — Consult Note (Signed)
   NAME:  Brian Mack, MRN:  [id number], DOB:  12/20/1985, LOS: 7 ADMISSION DATE:  05/31/2020, CONSULTATION DATE:  05/14/2020 REFERRING MD:  Ophelia Charter, CHIEF COMPLAINT:  SOB   Brief History   34 year old unvaccinated man with no PMH presenting with severe COVID pneumonia.  History of present illness     Past Medical History  None  Significant Hospital Events   9/2 admitted 9/9 PCCM reconsulted  Consults:  N/A  Procedures:  N/A  Significant Diagnostic Tests:    Micro Data:  COVID +  Antimicrobials:  Ceftriaxone 9/2>> Azithromycin 9/2>>   Interim history/subjective:   PCCM reconsulted for progressive hypoxic respiratory failure.  Objective   Blood pressure 126/75, pulse 74, temperature 98.2 F (36.8 C), temperature source Oral, resp. rate 17, height 5\' 9"  (1.753 m), weight 115.8 kg, SpO2 (!) 85 %.    FiO2 (%):  [90 %-100 %] 100 %   Intake/Output Summary (Last 24 hours) at 05/22/2020 07/22/2020 Last data filed at 05/21/2020 2147 Gross per 24 hour  Intake 363 ml  Output 525 ml  Net -162 ml   Filed Weights   05/16/20 0257  Weight: 115.8 kg    Examination: Gen:      Mild respiratory distress, obese HEENT:  EOMI, sclera anicteric Neck:     No masses; no thyromegaly Lungs:    Clear to auscultation bilaterally; normal respiratory effort CV:         Regular rate and rhythm; no murmurs Abd:      + bowel sounds; soft, non-tender; no palpable masses, no distension Ext:    No edema; adequate peripheral perfusion Skin:      Warm and dry; no rash Neuro: alert and oriented x 3  Lab significant for  PO2 55, WBC 24.4, BUN/creatinine 18/1.07 AST 45, ALT 68, PCT 0.11 No new imaging  Resolved Hospital Problem list   N/A  Assessment & Plan:  COVID-19 pneumonia, severe ARDS Finished remdesivir Continue baricitinib steroids given worsening oxygenation and increased work of breathing we will transfer to ICU for close monitoring. Encourage awake proning, incentive  spirometer. Check D-dimer and chest x-ray to reassess given change in respiratory status.  Best practice:  Diet: per primary Pain/Anxiety/Delirium protocol (if indicated): per primary VAP protocol (if indicated): n/a DVT prophylaxis: lovenox GI prophylaxis: n/a Glucose control: n/a Mobility: up to chair during day if tolerates Code Status: full Family Communication: Patient updated Disposition: ICU  Critical care time:   The patient is critically ill with multiple organ system failure and requires high complexity decision making for assessment and support, frequent evaluation and titration of therapies, advanced monitoring, review of radiographic studies and interpretation of complex data.   Critical Care Time devoted to patient care services, exclusive of separately billable procedures, described in this note is 35 minutes.   03-21-1975 MD [facility] Please see [email or web address] for pager details.  05/22/2020, 6:44 AM

## 2020-05-22 NOTE — Progress Notes (Signed)
Patient was transferred to ICU. Patient had to be placed on 15L HFNC and 15L NRB to transport and oxygen saturation dropped to the mid 70s. Within 5 minutes his saturation went up to and stayed around 85%. SWAT was at bedside and assisted with transport down to 32M. Spouse Cala Bradford was also updated about the patients transfer to 32M.

## 2020-05-22 NOTE — Progress Notes (Signed)
PROGRESS NOTE  Brian Mack VEL:[id number] DOB: 02/24/1986 DOA: 29-May-2020  PCP: Patient, No Pcp Per  Brief History/Interval Summary:  - 34 year old Caucasian male with no significant past medical history except for obesity who came in with worsening cough shortness of breath.  He previously has had nausea and diarrhea.  He was noted to be hypoxic.  Hospitalized for further management.  Reason for Visit: Acute respiratory failure with hypoxia.  Pneumonia due to COVID-19.  Consultants: PCCM  Procedures: None  Antibiotics: Anti-infectives (From admission, onward)   Start     Dose/Rate Route Frequency Ordered Stop   05/16/20 1000  remdesivir 100 mg in sodium chloride 0.9 % 100 mL IVPB  Status:  Discontinued        100 mg 200 mL/hr over 30 Minutes Intravenous Daily 05/29/20 0517 05/21/20 1312   05/29/20 0915  cefTRIAXone (ROCEPHIN) 2 g in sodium chloride 0.9 % 100 mL IVPB        2 g 200 mL/hr over 30 Minutes Intravenous Every 24 hours 2020-05-29 0909 05/19/20 1126   29-May-2020 0915  azithromycin (ZITHROMAX) 500 mg in sodium chloride 0.9 % 250 mL IVPB        500 mg 250 mL/hr over 60 Minutes Intravenous Every 24 hours 2020/05/29 0909 05/19/20 1343   2020/05/29 0530  remdesivir 200 mg in sodium chloride 0.9% 250 mL IVPB        200 mg 580 mL/hr over 30 Minutes Intravenous Once May 29, 2020 0517 05/29/20 0746      Subjective/Interval History:  Patient with increased work of breathing overnight, and significant increase in oxygen requirement, he is transferred to ICU this a.m., report he is feeling more dyspneic, and still reporting cough .  Assessment/Plan:  Acute Hypoxic Resp. Failure/Pneumonia due to COVID-19/sepsis ruled out -Patient is unvaccinated. -Patient with severe COVID-19 pneumonia/ARDS, severe hypoxic respiratory failure, he is currently on 50 L heated high flow nasal cannula +15 L NRB, he is transferred to ICU as high risk for intubation. -Continue with IV  Solu-Medrol. -Completed 5-day course of ceftriaxone and azithromycin.  Procalcitonin was 1.15. - Remdesivir: Completed course on 9/6 - Started on baricitinib on 9/2 -On Lasix as needed, he received IV Lasix yesterday and today -He is with leukocytosis today, most likely related to steroids, procalcitonin is negative which is reassuring. -Is encouraged to use incentive spirometry, flutter valve, and to prone. -Continue to trend inflammatory markers, CRP is trending up at 8.4, he is on IV Solu-Medrol, procalcitonin within normal limits, no evidence of bacterial infection.  Recent Labs  Lab 05/16/20 0408 05/16/20 0408 05/17/20 0525 05/17/20 0525 05/18/20 0214 05/19/20 0328 05/20/20 0335 05/21/20 0533 05/21/20 1319 05/22/20 0626  DDIMER 1.73*   < > 1.05*  --  0.87* 0.79* 0.68*  --   --  0.53*  FERRITIN 1,518*  --   --   --   --   --   --   --   --   --   CRP 10.3*   < > 3.5*  --  1.6* 0.9 4.4*  --   --  8.4*  ALT 38   < > 37   < > 90* 99* 89* 68*  --  51*  PROCALCITON  --   --  0.19  --  <0.10  --   --   --  0.11 <0.10   < > = values in this interval not displayed.    Recent Labs    05/20/20 0335 05/22/20 0626  DDIMER 0.68*  0.53*  CRP 4.4* 8.4*    Lab Results  Component Value Date   SARSCOV2NAA POSITIVE (A) 05/11/2020     Ureteral colic - Patient noted to have abdominal tenderness.  He had episodes of vomiting.  CT scan was subsequently done which showed a punctate stone in the mid right ureter with a mild right hydronephrosis.  This was discussed with Dr. Mena GoesEskridge with urology who recommended IV fluids and Flomax.  Patient already on antibacterials.  No indication for any urological procedures currently as he is not septic plus he will be high risk considering his severe respiratory disease.   Abdominal pain has improved.  Renal function has improved.  IV fluids were discontinued.   Hopefully he will pass the stone on his own.    Acute kidney injury likely due to  obstruction Baseline creatinine is not known.  Stone was found in his right mid ureter as discussed above.  IV fluids were initiated.  Flomax was ordered.  Creatinine is improved and is normal today.  IV fluids were stopped.  Monitor urine output.    Hyponatremia Likely due to hypovolemia.  Improved.  Hypokalemia Repleted.  Magnesium has been normal.  Transaminitis Secondary to COVID-19.  Continue to monitor.  LFTs are stable.  Lactic acidosis Due to hypovolemia.  Improved from 2.4-1.7.  Obesity Estimated body mass index is 37.7 kg/m as calculated from the following:   Height as of this encounter: 5\' 9"  (1.753 m).   Weight as of this encounter: 115.8 kg.   DVT Prophylaxis: Lovenox Code Status: Full code Family Communication: Patient girlfriend was updated Janora NorlanderKimberly Barbnard per his request, she was updated about his critical respiratory status and possible need for intubation. Disposition Plan: Still unclear,  Status is: Inpatient  Remains inpatient appropriate because:IV treatments appropriate due to intensity of illness or inability to take PO and Inpatient level of care appropriate due to severity of illness   Dispo:  Patient From: Home  Planned Disposition: Home  Expected discharge date: 05/30/20  Medically stable for discharge: No       Medications:  Scheduled: . baricitinib  4 mg Oral Daily  . Chlorhexidine Gluconate Cloth  6 each Topical Daily  . enoxaparin (LOVENOX) injection  60 mg Subcutaneous Q24H  . feeding supplement (ENSURE ENLIVE)  237 mL Oral BID BM  . mouth rinse  15 mL Mouth Rinse BID  . methylPREDNISolone (SOLU-MEDROL) injection  0.5 mg/kg Intravenous Q12H  . pantoprazole  40 mg Oral Q1200  . polyethylene glycol  17 g Oral BID  . sodium chloride flush  3 mL Intravenous Q12H  . tamsulosin  0.4 mg Oral QPC supper   Continuous: . sodium chloride     ZOX:WRUEAVPRN:sodium chloride, acetaminophen, albuterol, chlorpheniramine-HYDROcodone,  guaiFENesin-dextromethorphan, hydrocortisone, lip balm, loperamide, ondansetron **OR** ondansetron (ZOFRAN) IV, oxyCODONE, phenol, sodium chloride, sodium chloride flush, witch hazel-glycerin   Objective:  Vital Signs  Vitals:   05/22/20 0929 05/22/20 0943 05/22/20 0945 05/22/20 1000  BP:   134/84 128/79  Pulse: (!) 108 96 91 87  Resp: (!) 31 (!) 33 (!) 30 (!) 33  Temp:      TempSrc:      SpO2: (!) 79% (!) 84% (!) 86% (!) 86%  Weight:      Height:        Intake/Output Summary (Last 24 hours) at 05/22/2020 1051 Last data filed at 05/21/2020 2147 Gross per 24 hour  Intake 243 ml  Output 400 ml  Net -157 ml  Filed Weights   05/16/20 0257  Weight: 115.8 kg    Awake Alert, in mild respiratory distress  Symmetrical Chest wall movement, tachypneic, diminished air entry at the bases RRR,No Gallops,Rubs or new Murmurs, No Parasternal Heave +ve B.Sounds, Abd Soft, No tenderness, No rebound - guarding or rigidity. No Cyanosis, Clubbing or edema, No new Rash or bruise        Lab Results:  Data Reviewed: I have personally reviewed following labs and imaging studies  CBC: Recent Labs  Lab 05/16/20 0408 05/16/20 0408 05/17/20 0525 05/17/20 0525 05/18/20 0214 05/19/20 0328 05/20/20 0335 05/21/20 0533 05/22/20 0626  WBC 9.9   < > 14.7*   < > 15.3* 18.2* 21.6* 24.4* 23.4*  NEUTROABS 6.8  --  12.3*  --  12.0* 14.9* 18.4*  --   --   HGB 15.9   < > 15.9   < > 15.7 14.0 15.5 14.6 15.2  HCT 47.4   < > 47.3   < > 46.1 42.2 47.1 44.0 46.2  MCV 80.2   < > 82.0   < > 81.7 81.5 81.9 82.7 83.8  PLT 235   < > 335   < > 348 362 434* 417* 433*   < > = values in this interval not displayed.    Basic Metabolic Panel: Recent Labs  Lab 05/16/20 0408 05/16/20 0408 05/17/20 4098 05/17/20 1191 05/18/20 0214 05/19/20 4782 05/20/20 0335 05/21/20 0533 05/22/20 0626  NA 134*   < > 136   < > 137 137 140 135 136  K 3.4*   < > 3.6   < > 3.9 3.8 3.9 4.0 4.9  CL 100   < > 102   < > 99  104 103 100 101  CO2 21*   < > 24   < > GLUCOSE 138*   < > 162*   < > 194* 201* 139* 136* 116*  BUN 22*   < > 26*   < > 26* 21* CREATININE 1.26*   < > 1.52*   < > 1.45* 1.25* 1.14 1.07 1.06  CALCIUM 8.4*   < > 8.5*   < > 8.3* 7.7* 7.9* 8.0* 8.0*  MG 2.3  --  2.6*  --  2.5* 2.5* 2.5*  --   --   PHOS 2.8  --   --   --   --   --   --   --   --    < > = values in this interval not displayed.    GFR: Estimated Creatinine Clearance: 124.4 mL/min (by C-G formula based on SCr of 1.06 mg/dL).  Liver Function Tests: Recent Labs  Lab 05/18/20 0214 05/19/20 0328 05/20/20 0335 05/21/20 0533 05/22/20 0626  AST 128* 76* 57* 45* 35  ALT 90* 99* 89* 68* 51*  ALKPHOS 60 56 58 62 59  BILITOT 1.2 0.5 1.1 0.7 0.7  PROT 6.2* 5.2* 5.9* 5.9* 5.7*  ALBUMIN 3.0* 2.6* 2.7* 2.6* 2.6*     Recent Results (from the past 240 hour(s))  Blood Culture (routine x 2)     Status: None   Collection Time: 2020/06/04  4:37 AM   Specimen: BLOOD  Result Value Ref Range Status   Specimen Description BLOOD RIGHT ANTECUBITAL  Final   Special Requests   Final    BOTTLES DRAWN AEROBIC AND ANAEROBIC Blood Culture adequate volume   Culture   Final    NO  GROWTH 5 DAYS Performed at [facility], [address], [place], [place] [address]    Report Status 05/20/2020 FINAL  Final  Blood Culture (routine x 2)     Status: None   Collection Time: 05/21/2020  5:20 AM   Specimen: BLOOD LEFT HAND  Result Value Ref Range Status   Specimen Description BLOOD LEFT HAND  Final   Special Requests   Final    BOTTLES DRAWN AEROBIC AND ANAEROBIC Blood Culture adequate volume   Culture   Final    NO GROWTH 5 DAYS Performed at [facility], [address] [place], [place] [address]    Report Status 05/20/2020 FINAL  Final  MRSA PCR Screening     Status: None   Collection Time: 05/20/20 10:56 PM   Specimen: Nasal Mucosa; Nasopharyngeal  Result Value Ref Range Status   MRSA by PCR NEGATIVE  NEGATIVE Final    Comment:        The GeneXpert MRSA Assay (FDA approved for NASAL specimens only), is one component of a comprehensive MRSA colonization surveillance program. It is not intended to diagnose MRSA infection nor to guide or monitor treatment for MRSA infections. Performed at [facility], [address] [place], [place] [address]       Radiology Studies: DG CHEST PORT 1 VIEW  Result Date: 05/22/2020 CLINICAL DATA:  Acute respiratory failure. EXAM: PORTABLE CHEST 1 VIEW COMPARISON:  05/21/2019. FINDINGS: Stable cardiomegaly. Stable diffuse bilateral interstitial infiltrates/edema. Progressive atelectasis and consolidation left lung base. No pleural effusion or pneumothorax. IMPRESSION: 1.  Stable cardiomegaly. 2. Stable diffuse bilateral interstitial infiltrates/edema. Progressive atelectasis and consolidation left lung base. Electronically Signed   By: Maisie Fus  Register   On: 05/22/2020 07:05   ECHOCARDIOGRAM LIMITED  Result Date: 05/20/2020    ECHOCARDIOGRAM LIMITED REPORT   Patient Name:   RANDOL ZUMSTEIN Date of Exam: 05/20/2020 Medical Rec #:  [id number]           Height:       69.0 in Accession #:    [id number]          Weight:       255.3 lb Date of Birth:  October 09, 1985            BSA:          2.292 m Patient Age:    34 years            BP:           126/81 mmHg Patient Gender: M                   HR:           74 bpm. Exam Location:  Inpatient Procedure: 2D Echo Indications:   dyspnea 786.09  History:       Patient has no prior history of Echocardiogram examinations.                Covid.  Sonographer:   Delcie Roch Referring      [id number] DGLOV FIEPPIRJ Phys:  Sonographer Comments: Image acquisition challenging due to respiratory motion. IMPRESSIONS  1. Left ventricular ejection fraction, by estimation, is 60 to 65%. The left ventricle has normal function. The left ventricle has no regional wall motion abnormalities. Left ventricular diastolic function could not be  evaluated.  2. Right ventricular systolic function is normal. The right ventricular size is normal. Tricuspid regurgitation signal is inadequate for assessing PA pressure.  3.  The mitral valve is normal in structure. No evidence of mitral valve regurgitation. No evidence of mitral stenosis.  4. The aortic valve is normal in structure. Aortic valve regurgitation is not visualized. No aortic stenosis is present. FINDINGS  Left Ventricle: Left ventricular ejection fraction, by estimation, is 60 to 65%. The left ventricle has normal function. The left ventricle has no regional wall motion abnormalities. The left ventricular internal cavity size was normal in size. There is  no left ventricular hypertrophy. Right Ventricle: The right ventricular size is normal. No increase in right ventricular wall thickness. Right ventricular systolic function is normal. Tricuspid regurgitation signal is inadequate for assessing PA pressure. Left Atrium: Left atrial size was normal in size. Right Atrium: Right atrial size was normal in size. Pericardium: There is no evidence of pericardial effusion. Mitral Valve: The mitral valve is normal in structure. Normal mobility of the mitral valve leaflets. No evidence of mitral valve stenosis. Tricuspid Valve: The tricuspid valve is normal in structure. Tricuspid valve regurgitation is not demonstrated. No evidence of tricuspid stenosis. Aortic Valve: The aortic valve is normal in structure. Aortic valve regurgitation is not visualized. No aortic stenosis is present. Pulmonic Valve: The pulmonic valve was not well visualized. Aorta: The aortic root is normal in size and structure. Venous: The inferior vena cava was not well visualized. IAS/Shunts: No atrial level shunt detected by color flow Doppler. LEFT VENTRICLE PLAX 2D LVIDd:         3.80 cm LVIDs:         2.00 cm LV PW:         0.90 cm LV IVS:        0.90 cm LVOT diam:     2.40 cm LV SV:         85 LV SV Index:   37 LVOT Area:     4.52 cm   LEFT ATRIUM         Index LA diam:    3.10 cm 1.35 cm/m  AORTIC VALVE LVOT Vmax:   92.00 cm/s LVOT Vmean:  63.700 cm/s LVOT VTI:    0.188 m  AORTA Ao Root diam: 3.20 cm MITRAL VALVE MV Area (PHT): 4.21 cm    SHUNTS MV Decel Time: 180 msec    Systemic VTI:  0.19 m MV E velocity: 78.00 cm/s  Systemic Diam: 2.40 cm MV A velocity: 56.10 cm/s MV E/A ratio:  1.39 Armanda Magic MD Electronically signed by Armanda Magic MD Signature Date/Time: 05/20/2020/6:54:19 PM    Final        LOS: 7 days   Corrisa Gibby  [facility] Pager on [email or web address]  05/22/2020, 10:51 AM

## 2020-05-22 NOTE — Progress Notes (Signed)
ABG drawn by Tim, RT on 45 L 100% HHFNC and NRB.

## 2020-05-22 NOTE — Progress Notes (Signed)
  Received pt from 5W as rapid response on 15l HFNC/15L NRB. Pt placed back on 60L HHFNC/100% Plus NRB. Pt in distress with increased WOB from transport. Pt states he is feeling better after being placed back on HHFNC post transport.

## 2020-05-22 NOTE — Significant Event (Signed)
Rapid Response Event Note   Reason for Call :  Called by RT d/t SpO2-81% on 45L 100% HHFNC and NRB.   Initial Focused Assessment:  Pt laying in bed on his L side. Pt alert and oriented, says he is SOB but it is no worse than it was at the beginning of the night. He is tachypneic but not using accessory muscles. Lungs diminished t/o. Skin warm and dry.  T-98.2, HR-74, BP-126/75, RR-42, SpO2-84% on 45L 100% HHFNC and NRB.    Interventions:  Dr. Leafy Half to bedside to see pt ABG ordered-7.45/38.9/54.7/27 PCCM consulted Plan of Care:  PCCM coming to see pt.   Event Summary:   MD Notified:  Call Time: Arrival Time: End Time:  Terrilyn Saver, RN

## 2020-05-22 NOTE — Significant Event (Signed)
HOSPITAL MEDICINE OVERNIGHT EVENT NOTE    Alerted by rapid response/respiratory patient is developing progressively worsening hypoxia between the upper 70's to low 80's despite being on heated high flow at 45L and 100% NRB mask.   Patient evaluated and is tachypneic however mentating appropriately.    ABG obtained revealing progressively worsening respiratory failure.  Case discussed with Dr. Valora Piccolo with CCM who has graciously agreed to send a CCM provider to evaluate the patient.    Marinda Elk  MD [facility]

## 2020-05-23 LAB — COMPREHENSIVE METABOLIC PANEL
ALT: 40 U/L (ref 0–44)
AST: 27 U/L (ref 15–41)
Albumin: 2.8 g/dL — ABNORMAL LOW (ref 3.5–5.0)
Alkaline Phosphatase: 65 U/L (ref 38–126)
Anion gap: 12 (ref 5–15)
BUN: 21 mg/dL — ABNORMAL HIGH (ref 6–20)
CO2: 26 mmol/L (ref 22–32)
Calcium: 8.4 mg/dL — ABNORMAL LOW (ref 8.9–10.3)
Chloride: 97 mmol/L — ABNORMAL LOW (ref 98–111)
Creatinine, Ser: 1.1 mg/dL (ref 0.61–1.24)
GFR calc Af Amer: >60 mL/min (ref >60)
GFR calc non Af Amer: >60 mL/min (ref >60)
Glucose, Bld: 130 mg/dL — ABNORMAL HIGH (ref 70–99)
Potassium: 4.4 mmol/L (ref 3.5–5.1)
Sodium: 135 mmol/L (ref 135–145)
Total Bilirubin: 0.9 mg/dL (ref 0.3–1.2)
Total Protein: 6.3 g/dL — ABNORMAL LOW (ref 6.5–8.1)

## 2020-05-23 LAB — PROCALCITONIN: Procalcitonin: <0.1 ng/mL

## 2020-05-23 LAB — CBC
HCT: 47.8 % (ref 39.0–52.0)
Hemoglobin: 15.7 g/dL (ref 13.0–17.0)
MCH: 27.3 pg (ref 26.0–34.0)
MCHC: 32.8 g/dL (ref 30.0–36.0)
MCV: 83.1 fL (ref 80.0–100.0)
Platelets: 434 10*3/uL — ABNORMAL HIGH (ref 150–400)
RBC: 5.75 MIL/uL (ref 4.22–5.81)
RDW: 13 % (ref 11.5–15.5)
WBC: 30 10*3/uL — ABNORMAL HIGH (ref 4.0–10.5)
nRBC: 0 % (ref 0.0–0.2)

## 2020-05-23 LAB — POCT I-STAT 7, (LYTES, BLD GAS, ICA,H+H)
Acid-Base Excess: 6 mmol/L — ABNORMAL HIGH (ref 0.0–2.0)
Bicarbonate: 31.3 mmol/L — ABNORMAL HIGH (ref 20.0–28.0)
Calcium, Ion: 1.11 mmol/L — ABNORMAL LOW (ref 1.15–1.40)
HCT: 48 % (ref 39.0–52.0)
Hemoglobin: 16.3 g/dL (ref 13.0–17.0)
O2 Saturation: 85 %
Potassium: 4.3 mmol/L (ref 3.5–5.1)
Sodium: 136 mmol/L (ref 135–145)
TCO2: 33 mmol/L — ABNORMAL HIGH (ref 22–32)
pCO2 arterial: 45.1 mmHg (ref 32.0–48.0)
pH, Arterial: 7.45 (ref 7.350–7.450)
pO2, Arterial: 48 mmHg — ABNORMAL LOW (ref 83.0–108.0)

## 2020-05-23 LAB — C-REACTIVE PROTEIN: CRP: 9 mg/dL — ABNORMAL HIGH (ref <1.0)

## 2020-05-23 LAB — GLUCOSE, CAPILLARY
Glucose-Capillary: 144 mg/dL — ABNORMAL HIGH (ref 70–99)
Glucose-Capillary: 195 mg/dL — ABNORMAL HIGH (ref 70–99)

## 2020-05-23 LAB — D-DIMER, QUANTITATIVE: D-Dimer, Quant: 0.76 ug/mL-FEU — ABNORMAL HIGH (ref 0.00–0.50)

## 2020-05-23 MED ORDER — ALBUTEROL SULFATE HFA 108 (90 BASE) MCG/ACT IN AERS
2.0000 | INHALATION_SPRAY | Freq: Four times a day (QID) | RESPIRATORY_TRACT | Status: DC
  Administered 2020-05-23 – 2020-05-24 (×5): 2 via RESPIRATORY_TRACT
  Filled 2020-05-23: qty 6.7

## 2020-05-23 MED ORDER — FUROSEMIDE 10 MG/ML IJ SOLN
20.0000 mg | Freq: Once | INTRAMUSCULAR | Status: AC
  Administered 2020-05-23: 20 mg via INTRAVENOUS
  Filled 2020-05-23: qty 2

## 2020-05-23 NOTE — Progress Notes (Signed)
eLink Physician-Brief Progress Note Patient Name: Brian Mack DOB: [birth year] MRN: [id number]   Date of Service  05/23/2020  HPI/Events of Note  Patient alert, oriented and interactive, saturation is low but clinically patient is not overtly distressed or having intolerable  Work of breathing.  eICU Interventions  No indication for anxiolytic at this time.        [name] Giordano Getman 05/23/2020, 6:17 AM

## 2020-05-23 NOTE — Progress Notes (Signed)
Attempted to assist patient to self-prone but he became slightly agitated with oxygen saturations dropping in the low 60's and stated that he is unable to self-prone. He is back on his back with oxygen saturations in the low 80's. Will continue to monitor throughout shift.

## 2020-05-23 NOTE — Consult Note (Signed)
NAME:  Brian Mack, MRN:  [id number], DOB:  20-May-1986, LOS: 8 ADMISSION DATE:  31-May-2020, CONSULTATION DATE:  31-May-2020 REFERRING MD:  Ophelia Charter, CHIEF COMPLAINT:  SOB   Brief History   34 year old unvaccinated man with no PMH presenting with severe COVID pneumonia.  History of present illness   34 yo M with hx obesity admitted 9/2 with worsening SOB after testing + for COVID-19 8/26. PCCM consulted 9/3 for SOB and agreed with current treatment of steroids, remdesivir, barcitinib, IS, supplemental O2. Reconsulted 9/9 due to hypoxemia. Transferred to ICU   Past Medical History  None  Significant Hospital Events   9/2 admitted 9/9 PCCM reconsulted  Consults:  N/A  Procedures:  N/A  Significant Diagnostic Tests:    Micro Data:  COVID +  Antimicrobials:  Ceftriaxone 9/2>> Azithromycin 9/2>>   Interim history/subjective:  NAEO  Transferred to ICU 9/9   Objective   Blood pressure 124/87, pulse 97, temperature 99 F (37.2 C), temperature source Axillary, resp. rate (!) 41, height 5\' 9"  (1.753 m), weight 115.8 kg, SpO2 (!) 82 %.    FiO2 (%):  [100 %] 100 %   Intake/Output Summary (Last 24 hours) at 05/23/2020 1002 Last data filed at 05/23/2020 0800 Gross per 24 hour  Intake 240 ml  Output 2750 ml  Net -2510 ml   Filed Weights   05/16/20 0257  Weight: 115.8 kg    Examination: Gen:      Obese young adult M, reclined in bed NAD  HEENT:  NCAT pink mm anicteric sclera trachea midline  Lungs:    Symmetrical chest expansion, no accessory muscle use on HF+NRB. Coarse with scattered rhonchi and crackles  CV:         RRR s1s2 no rgm cap refill brisk  Abd:      Soft round ndnt + bowel sounds  Ext:   Symmetrical bulk and tone, no obvious deformity, no cyanosis or clubbing  Skin:      C/d/w without rash  Neuro: AAOx4 following commands PERRLA   Resolved Hospital Problem list   N/A  Assessment & Plan:   COVID-19 pneumonia, severe ARDS Finished remdesivir Continue  baricitinib steroids On high amounts of O2 support without respiratory distress, encourage self-proning, IS If decompensates may require intubation but currently is in NAD on current support  Will order lasix this afternoon  AM CXR   Leukocytosis -PCT assuring, afebrile. Suspect r/t steroids -continue to trend wbc, fever curve   AKI, improved Ureteral colic in setting of mid R ureter stone -- improved    Best practice:  Diet: PO  Pain/Anxiety/Delirium protocol (if indicated): na VAP protocol (if indicated): n/a DVT prophylaxis: lovenox GI prophylaxis: n/a Glucose control: n/a Mobility: Up to chair, bed in chair, self prone  Code Status: full Family Communication: pt updated  Disposition: ICU  Critical care time:   CRITICAL CARE Performed by: 07/16/20   Total critical care time: 35 minutes  Critical care time was exclusive of separately billable procedures and treating other patients. Critical care was necessary to treat or prevent imminent or life-threatening deterioration.  Critical care was time spent personally by me on the following activities: development of treatment plan with patient and/or surrogate as well as nursing, discussions with consultants, evaluation of patient's response to treatment, examination of patient, obtaining history from patient or surrogate, ordering and performing treatments and interventions, ordering and review of laboratory studies, ordering and review of radiographic studies, pulse oximetry and re-evaluation of  patient's condition.  Tessie Fass MSN, AGACNP-BC [facility] [phone] If no answer, [phone] 05/23/2020, 10:02 AM

## 2020-05-24 ENCOUNTER — Inpatient Hospital Stay (HOSPITAL_COMMUNITY): Payer: HRSA Program

## 2020-05-24 ENCOUNTER — Encounter (HOSPITAL_COMMUNITY): Payer: Self-pay | Admitting: Internal Medicine

## 2020-05-24 DIAGNOSIS — R739 Hyperglycemia, unspecified: Secondary | ICD-10-CM

## 2020-05-24 DIAGNOSIS — J80 Acute respiratory distress syndrome: Secondary | ICD-10-CM

## 2020-05-24 DIAGNOSIS — J189 Pneumonia, unspecified organism: Secondary | ICD-10-CM

## 2020-05-24 DIAGNOSIS — U071 COVID-19: Secondary | ICD-10-CM

## 2020-05-24 DIAGNOSIS — N179 Acute kidney failure, unspecified: Secondary | ICD-10-CM

## 2020-05-24 LAB — BLOOD GAS, ARTERIAL
Acid-Base Excess: 2.4 mmol/L — ABNORMAL HIGH (ref 0.0–2.0)
Acid-Base Excess: 3.8 mmol/L — ABNORMAL HIGH (ref 0.0–2.0)
Bicarbonate: 31.9 mmol/L — ABNORMAL HIGH (ref 20.0–28.0)
Bicarbonate: 33.5 mmol/L — ABNORMAL HIGH (ref 20.0–28.0)
Drawn by: 441371
FIO2: 100
FIO2: 100
O2 Saturation: 92.3 %
O2 Saturation: 93.5 %
Patient temperature: 36.5
Patient temperature: 37
pCO2 arterial: 113 mmHg (ref 32.0–48.0)
pCO2 arterial: 116 mmHg (ref 32.0–48.0)
pH, Arterial: 7.08 — CL (ref 7.350–7.450)
pH, Arterial: 7.084 — CL (ref 7.350–7.450)
pO2, Arterial: 93.4 mmHg (ref 83.0–108.0)
pO2, Arterial: 93.9 mmHg (ref 83.0–108.0)

## 2020-05-24 LAB — CBC
HCT: 55.3 % — ABNORMAL HIGH (ref 39.0–52.0)
Hemoglobin: 17.9 g/dL — ABNORMAL HIGH (ref 13.0–17.0)
MCH: 27.2 pg (ref 26.0–34.0)
MCHC: 32.4 g/dL (ref 30.0–36.0)
MCV: 83.9 fL (ref 80.0–100.0)
Platelets: 471 10*3/uL — ABNORMAL HIGH (ref 150–400)
RBC: 6.59 MIL/uL — ABNORMAL HIGH (ref 4.22–5.81)
RDW: 13.1 % (ref 11.5–15.5)
WBC: 28.6 10*3/uL — ABNORMAL HIGH (ref 4.0–10.5)
nRBC: 0 % (ref 0.0–0.2)

## 2020-05-24 LAB — COMPREHENSIVE METABOLIC PANEL
ALT: 40 U/L (ref 0–44)
AST: 33 U/L (ref 15–41)
Albumin: 3.3 g/dL — ABNORMAL LOW (ref 3.5–5.0)
Alkaline Phosphatase: 93 U/L (ref 38–126)
Anion gap: 14 (ref 5–15)
BUN: 22 mg/dL — ABNORMAL HIGH (ref 6–20)
CO2: 29 mmol/L (ref 22–32)
Calcium: 8.7 mg/dL — ABNORMAL LOW (ref 8.9–10.3)
Chloride: 93 mmol/L — ABNORMAL LOW (ref 98–111)
Creatinine, Ser: 1.38 mg/dL — ABNORMAL HIGH (ref 0.61–1.24)
GFR calc Af Amer: >60 mL/min (ref >60)
GFR calc non Af Amer: >60 mL/min (ref >60)
Glucose, Bld: 159 mg/dL — ABNORMAL HIGH (ref 70–99)
Potassium: 4.2 mmol/L (ref 3.5–5.1)
Sodium: 136 mmol/L (ref 135–145)
Total Bilirubin: 1 mg/dL (ref 0.3–1.2)
Total Protein: 7.7 g/dL (ref 6.5–8.1)

## 2020-05-24 LAB — POCT I-STAT 7, (LYTES, BLD GAS, ICA,H+H)
Acid-Base Excess: 5 mmol/L — ABNORMAL HIGH (ref 0.0–2.0)
Acid-Base Excess: 4 mmol/L — ABNORMAL HIGH (ref 0.0–2.0)
Bicarbonate: 30.7 mmol/L — ABNORMAL HIGH (ref 20.0–28.0)
Bicarbonate: 30.5 mmol/L — ABNORMAL HIGH (ref 20.0–28.0)
Calcium, Ion: 1.1 mmol/L — ABNORMAL LOW (ref 1.15–1.40)
Calcium, Ion: 1.1 mmol/L — ABNORMAL LOW (ref 1.15–1.40)
HCT: 48 % (ref 39.0–52.0)
HCT: 47 % (ref 39.0–52.0)
Hemoglobin: 16.3 g/dL (ref 13.0–17.0)
Hemoglobin: 16 g/dL (ref 13.0–17.0)
O2 Saturation: 87 %
O2 Saturation: 86 %
Patient temperature: 98.6
Patient temperature: 98
Potassium: 4.5 mmol/L (ref 3.5–5.1)
Potassium: 4.6 mmol/L (ref 3.5–5.1)
Sodium: 135 mmol/L (ref 135–145)
Sodium: 134 mmol/L — ABNORMAL LOW (ref 135–145)
TCO2: 32 mmol/L (ref 22–32)
TCO2: 32 mmol/L (ref 22–32)
pCO2 arterial: 46.3 mmHg (ref 32.0–48.0)
pCO2 arterial: 51.2 mmHg — ABNORMAL HIGH (ref 32.0–48.0)
pH, Arterial: 7.43 (ref 7.350–7.450)
pH, Arterial: 7.381 (ref 7.350–7.450)
pO2, Arterial: 53 mmHg — ABNORMAL LOW (ref 83.0–108.0)
pO2, Arterial: 53 mmHg — ABNORMAL LOW (ref 83.0–108.0)

## 2020-05-24 LAB — BASIC METABOLIC PANEL
Anion gap: 11 (ref 5–15)
BUN: 28 mg/dL — ABNORMAL HIGH (ref 6–20)
CO2: 30 mmol/L (ref 22–32)
Calcium: 8.1 mg/dL — ABNORMAL LOW (ref 8.9–10.3)
Chloride: 96 mmol/L — ABNORMAL LOW (ref 98–111)
Creatinine, Ser: 1.3 mg/dL — ABNORMAL HIGH (ref 0.61–1.24)
GFR calc Af Amer: >60 mL/min (ref >60)
GFR calc non Af Amer: >60 mL/min (ref >60)
Glucose, Bld: 257 mg/dL — ABNORMAL HIGH (ref 70–99)
Potassium: 5.7 mmol/L — ABNORMAL HIGH (ref 3.5–5.1)
Sodium: 137 mmol/L (ref 135–145)

## 2020-05-24 LAB — D-DIMER, QUANTITATIVE
D-Dimer, Quant: 1.32 ug/mL-FEU — ABNORMAL HIGH (ref 0.00–0.50)
D-Dimer, Quant: 1.22 ug/mL-FEU — ABNORMAL HIGH (ref 0.00–0.50)

## 2020-05-24 LAB — GLUCOSE, CAPILLARY
Glucose-Capillary: 106 mg/dL — ABNORMAL HIGH (ref 70–99)
Glucose-Capillary: 145 mg/dL — ABNORMAL HIGH (ref 70–99)
Glucose-Capillary: 162 mg/dL — ABNORMAL HIGH (ref 70–99)
Glucose-Capillary: 165 mg/dL — ABNORMAL HIGH (ref 70–99)
Glucose-Capillary: 217 mg/dL — ABNORMAL HIGH (ref 70–99)
Glucose-Capillary: 253 mg/dL — ABNORMAL HIGH (ref 70–99)

## 2020-05-24 LAB — C-REACTIVE PROTEIN: CRP: 9.8 mg/dL — ABNORMAL HIGH (ref <1.0)

## 2020-05-24 MED ORDER — OXYCODONE HCL 5 MG PO TABS
5.0000 mg | ORAL_TABLET | ORAL | Status: DC | PRN
  Administered 2020-05-27: 5 mg
  Filled 2020-05-24: qty 1

## 2020-05-24 MED ORDER — MIDAZOLAM BOLUS VIA INFUSION
1.0000 mg | INTRAVENOUS | Status: DC | PRN
  Filled 2020-05-24: qty 2

## 2020-05-24 MED ORDER — VITAL HIGH PROTEIN PO LIQD: 1000.0000 mL | ORAL | Status: DC

## 2020-05-24 MED ORDER — FENTANYL 2500MCG IN NS 250ML (10MCG/ML) PREMIX INFUSION
50.0000 ug/h | INTRAVENOUS | Status: DC
  Administered 2020-05-24: 200 ug/h via INTRAVENOUS
  Administered 2020-05-24: 100 ug/h via INTRAVENOUS
  Filled 2020-05-24 (×2): qty 250

## 2020-05-24 MED ORDER — FENTANYL CITRATE (PF) 100 MCG/2ML IJ SOLN: 50.0000 ug | Freq: Once | INTRAMUSCULAR | Status: DC

## 2020-05-24 MED ORDER — FENTANYL BOLUS VIA INFUSION
50.0000 ug | INTRAVENOUS | Status: DC | PRN
  Administered 2020-05-26 – 2020-06-01 (×21): 50 ug via INTRAVENOUS
  Filled 2020-05-24: qty 50

## 2020-05-24 MED ORDER — MIDAZOLAM HCL 2 MG/2ML IJ SOLN: 2.0000 mg | INTRAMUSCULAR | Status: DC | PRN

## 2020-05-24 MED ORDER — ONDANSETRON HCL 4 MG/2ML IJ SOLN: 4.0000 mg | Freq: Four times a day (QID) | INTRAMUSCULAR | Status: DC | PRN

## 2020-05-24 MED ORDER — DOCUSATE SODIUM 50 MG/5ML PO LIQD
100.0000 mg | Freq: Two times a day (BID) | ORAL | Status: DC
  Administered 2020-05-24 – 2020-05-29 (×11): 100 mg
  Filled 2020-05-24 (×11): qty 10

## 2020-05-24 MED ORDER — VECURONIUM BOLUS VIA INFUSION: 0.0800 mg/kg | Freq: Once | INTRAVENOUS | Status: DC

## 2020-05-24 MED ORDER — VECURONIUM BOLUS VIA INFUSION
0.0800 mg/kg | Freq: Once | INTRAVENOUS | Status: DC
  Filled 2020-05-24: qty 10

## 2020-05-24 MED ORDER — SODIUM ZIRCONIUM CYCLOSILICATE 10 G PO PACK
10.0000 g | PACK | Freq: Once | ORAL | Status: AC
  Administered 2020-05-24: 10 g
  Filled 2020-05-24: qty 1

## 2020-05-24 MED ORDER — PANTOPRAZOLE SODIUM 40 MG IV SOLR
40.0000 mg | Freq: Every day | INTRAVENOUS | Status: DC
  Administered 2020-05-24 – 2020-05-29 (×6): 40 mg via INTRAVENOUS
  Filled 2020-05-24 (×5): qty 40

## 2020-05-24 MED ORDER — NOREPINEPHRINE 4 MG/250ML-% IV SOLN: 0.0000 ug/min | INTRAVENOUS | Status: DC

## 2020-05-24 MED ORDER — FENTANYL 2500MCG IN NS 250ML (10MCG/ML) PREMIX INFUSION
50.0000 ug/h | INTRAVENOUS | Status: DC
  Filled 2020-05-24: qty 250

## 2020-05-24 MED ORDER — FENTANYL CITRATE (PF) 100 MCG/2ML IJ SOLN: 100.0000 ug | Freq: Once | INTRAMUSCULAR | Status: AC

## 2020-05-24 MED ORDER — ETOMIDATE 2 MG/ML IV SOLN
INTRAVENOUS | Status: AC
  Administered 2020-05-24: 20 mg via INTRAVENOUS
  Filled 2020-05-24: qty 20

## 2020-05-24 MED ORDER — FENTANYL BOLUS VIA INFUSION
50.0000 ug | INTRAVENOUS | Status: DC | PRN
  Filled 2020-05-24: qty 50

## 2020-05-24 MED ORDER — VECURONIUM BROMIDE 10 MG IV SOLR
10.0000 mg | INTRAVENOUS | Status: DC | PRN
  Administered 2020-05-24 – 2020-06-04 (×6): 10 mg via INTRAVENOUS
  Filled 2020-05-24 (×16): qty 10

## 2020-05-24 MED ORDER — STERILE WATER FOR INJECTION IJ SOLN
INTRAMUSCULAR | Status: AC
  Filled 2020-05-24: qty 10

## 2020-05-24 MED ORDER — FREE WATER
200.0000 mL | Status: DC
  Administered 2020-05-24 – 2020-05-28 (×24): 200 mL

## 2020-05-24 MED ORDER — CHLORHEXIDINE GLUCONATE 0.12% ORAL RINSE (MEDLINE KIT)
15.0000 mL | Freq: Two times a day (BID) | OROMUCOSAL | Status: DC
  Administered 2020-05-24 – 2020-06-15 (×45): 15 mL via OROMUCOSAL

## 2020-05-24 MED ORDER — MIDAZOLAM BOLUS VIA INFUSION
1.0000 mg | INTRAVENOUS | Status: DC | PRN
  Administered 2020-05-26 – 2020-06-08 (×22): 2 mg via INTRAVENOUS
  Filled 2020-05-24: qty 2

## 2020-05-24 MED ORDER — PROSOURCE TF PO LIQD
90.0000 mL | Freq: Three times a day (TID) | ORAL | Status: DC
  Administered 2020-05-24 – 2020-06-15 (×63): 90 mL
  Filled 2020-05-24 (×62): qty 90

## 2020-05-24 MED ORDER — SODIUM CHLORIDE 0.9 % IV SOLN
100.0000 mg | Freq: Once | INTRAVENOUS | Status: DC
  Filled 2020-05-24: qty 100

## 2020-05-24 MED ORDER — ETOMIDATE 2 MG/ML IV SOLN: 20.0000 mg | Freq: Once | INTRAVENOUS | Status: AC

## 2020-05-24 MED ORDER — FENTANYL CITRATE (PF) 100 MCG/2ML IJ SOLN: 50.0000 ug | Freq: Once | INTRAMUSCULAR | Status: AC

## 2020-05-24 MED ORDER — ONDANSETRON HCL 4 MG PO TABS: 4.0000 mg | ORAL_TABLET | Freq: Four times a day (QID) | ORAL | Status: DC | PRN

## 2020-05-24 MED ORDER — DEXMEDETOMIDINE HCL IN NACL 400 MCG/100ML IV SOLN
0.2000 ug/kg/h | INTRAVENOUS | Status: DC
  Administered 2020-05-24: 0.2 ug/kg/h via INTRAVENOUS
  Filled 2020-05-24: qty 100

## 2020-05-24 MED ORDER — VITAL AF 1.2 CAL PO LIQD
1000.0000 mL | ORAL | Status: DC
  Administered 2020-05-24 – 2020-05-30 (×5): 1000 mL
  Filled 2020-05-24: qty 1000

## 2020-05-24 MED ORDER — VECURONIUM BROMIDE 10 MG IV SOLR
0.0000 ug/kg/min | INTRAVENOUS | Status: DC
  Administered 2020-05-24: 0.5 ug/kg/min via INTRAVENOUS
  Administered 2020-05-25: 0.9 ug/kg/min via INTRAVENOUS
  Administered 2020-05-26 – 2020-05-28 (×3): 0.8 ug/kg/min via INTRAVENOUS
  Administered 2020-05-28 – 2020-05-31 (×3): 0.5 ug/kg/min via INTRAVENOUS
  Filled 2020-05-24 (×10): qty 100

## 2020-05-24 MED ORDER — SODIUM CHLORIDE 0.9 % IV SOLN
2.0000 g | Freq: Three times a day (TID) | INTRAVENOUS | Status: DC
  Administered 2020-05-24 – 2020-05-27 (×10): 2 g via INTRAVENOUS
  Filled 2020-05-24 (×10): qty 2

## 2020-05-24 MED ORDER — NOREPINEPHRINE 4 MG/250ML-% IV SOLN
2.0000 ug/min | INTRAVENOUS | Status: DC
  Filled 2020-05-24 (×2): qty 250

## 2020-05-24 MED ORDER — SODIUM CHLORIDE 0.9 % IV SOLN
200.0000 mg | Freq: Once | INTRAVENOUS | Status: AC
  Administered 2020-05-24: 200 mg via INTRAVENOUS
  Filled 2020-05-24: qty 200

## 2020-05-24 MED ORDER — BARICITINIB 2 MG PO TABS: 4.0000 mg | ORAL_TABLET | Freq: Every day | ORAL | Status: DC

## 2020-05-24 MED ORDER — SUCCINYLCHOLINE CHLORIDE 20 MG/ML IJ SOLN
1.5000 mg/kg | Freq: Once | INTRAMUSCULAR | Status: AC
  Administered 2020-05-24: 174 mg via INTRAVENOUS
  Filled 2020-05-24: qty 8.69

## 2020-05-24 MED ORDER — SODIUM CHLORIDE 0.9 % IV SOLN
200.0000 mg | Freq: Once | INTRAVENOUS | Status: DC
  Filled 2020-05-24: qty 200

## 2020-05-24 MED ORDER — GUAIFENESIN-DM 100-10 MG/5ML PO SYRP
10.0000 mL | ORAL_SOLUTION | ORAL | Status: DC | PRN
  Filled 2020-05-24: qty 10

## 2020-05-24 MED ORDER — SODIUM CHLORIDE 0.9 % IV SOLN
250.0000 mL | INTRAVENOUS | Status: DC
  Administered 2020-05-24 – 2020-06-06 (×4): 250 mL via INTRAVENOUS

## 2020-05-24 MED ORDER — MIDAZOLAM 50MG/50ML (1MG/ML) PREMIX INFUSION
2.0000 mg/h | INTRAVENOUS | Status: DC
  Administered 2020-05-24 – 2020-05-27 (×15): 10 mg/h via INTRAVENOUS
  Administered 2020-05-28: 7 mg/h via INTRAVENOUS
  Administered 2020-05-28: 10 mg/h via INTRAVENOUS
  Administered 2020-05-28: 8 mg/h via INTRAVENOUS
  Administered 2020-05-28 – 2020-06-05 (×37): 10 mg/h via INTRAVENOUS
  Administered 2020-06-05: 8 mg/h via INTRAVENOUS
  Administered 2020-06-05 – 2020-06-09 (×17): 10 mg/h via INTRAVENOUS
  Filled 2020-05-24 (×12): qty 50
  Filled 2020-05-24: qty 100
  Filled 2020-05-24 (×8): qty 50
  Filled 2020-05-24: qty 100
  Filled 2020-05-24 (×10): qty 50
  Filled 2020-05-24: qty 100
  Filled 2020-05-24 (×36): qty 50

## 2020-05-24 MED ORDER — VECURONIUM BROMIDE 10 MG IV SOLR: 0.0000 ug/kg/min | INTRAVENOUS | Status: DC

## 2020-05-24 MED ORDER — MIDAZOLAM HCL 2 MG/2ML IJ SOLN
INTRAMUSCULAR | Status: AC
  Administered 2020-05-24: 2 mg via INTRAVENOUS
  Filled 2020-05-24: qty 4

## 2020-05-24 MED ORDER — POLYETHYLENE GLYCOL 3350 17 G PO PACK
17.0000 g | PACK | Freq: Every day | ORAL | Status: DC
  Administered 2020-05-24 – 2020-05-29 (×5): 17 g
  Filled 2020-05-24 (×4): qty 1

## 2020-05-24 MED ORDER — ARTIFICIAL TEARS OPHTHALMIC OINT
1.0000 "application " | TOPICAL_OINTMENT | Freq: Three times a day (TID) | OPHTHALMIC | Status: DC
  Administered 2020-05-24 – 2020-05-25 (×4): 1 via OPHTHALMIC
  Filled 2020-05-24: qty 3.5

## 2020-05-24 MED ORDER — ORAL CARE MOUTH RINSE
15.0000 mL | OROMUCOSAL | Status: DC
  Administered 2020-05-24 – 2020-06-15 (×220): 15 mL via OROMUCOSAL

## 2020-05-24 MED ORDER — PHENYLEPHRINE HCL-NACL 10-0.9 MG/250ML-% IV SOLN
INTRAVENOUS | Status: AC
  Filled 2020-05-24: qty 250

## 2020-05-24 MED ORDER — FENTANYL CITRATE (PF) 100 MCG/2ML IJ SOLN
INTRAMUSCULAR | Status: AC
  Administered 2020-05-24: 100 ug via INTRAVENOUS
  Filled 2020-05-24: qty 2

## 2020-05-24 MED ORDER — PROSOURCE TF PO LIQD: 45.0000 mL | Freq: Two times a day (BID) | ORAL | Status: DC

## 2020-05-24 MED ORDER — ACETAMINOPHEN 325 MG PO TABS
650.0000 mg | ORAL_TABLET | Freq: Four times a day (QID) | ORAL | Status: DC | PRN
  Administered 2020-06-10 (×2): 650 mg
  Filled 2020-05-24 (×2): qty 2

## 2020-05-24 MED ORDER — ARTIFICIAL TEARS OPHTHALMIC OINT
1.0000 "application " | TOPICAL_OINTMENT | Freq: Three times a day (TID) | OPHTHALMIC | Status: DC
  Administered 2020-05-26 – 2020-05-31 (×13): 1 via OPHTHALMIC
  Filled 2020-05-24: qty 3.5

## 2020-05-24 MED ORDER — ROCURONIUM BROMIDE 10 MG/ML (PF) SYRINGE
PREFILLED_SYRINGE | INTRAVENOUS | Status: AC
  Filled 2020-05-24: qty 10

## 2020-05-24 MED ORDER — INSULIN ASPART 100 UNIT/ML ~~LOC~~ SOLN
2.0000 [IU] | SUBCUTANEOUS | Status: DC
  Administered 2020-05-24: 6 [IU] via SUBCUTANEOUS
  Administered 2020-05-24: 4 [IU] via SUBCUTANEOUS
  Administered 2020-05-25 (×2): 6 [IU] via SUBCUTANEOUS
  Administered 2020-05-25: 4 [IU] via SUBCUTANEOUS

## 2020-05-24 MED ORDER — VANCOMYCIN HCL IN DEXTROSE 1-5 GM/200ML-% IV SOLN
1000.0000 mg | Freq: Three times a day (TID) | INTRAVENOUS | Status: DC
  Administered 2020-05-24 – 2020-05-27 (×10): 1000 mg via INTRAVENOUS
  Filled 2020-05-24 (×10): qty 200

## 2020-05-24 MED ORDER — SODIUM CHLORIDE 0.9 % IV SOLN
100.0000 mg | INTRAVENOUS | Status: DC
  Administered 2020-05-25 – 2020-05-26 (×2): 100 mg via INTRAVENOUS
  Filled 2020-05-24 (×2): qty 100

## 2020-05-24 MED ORDER — FENTANYL 2500MCG IN NS 250ML (10MCG/ML) PREMIX INFUSION
50.0000 ug/h | INTRAVENOUS | Status: DC
  Administered 2020-05-24 – 2020-05-25 (×3): 200 ug/h via INTRAVENOUS
  Administered 2020-05-26 (×3): 350 ug/h via INTRAVENOUS
  Administered 2020-05-27: 400 ug/h via INTRAVENOUS
  Administered 2020-05-27 (×2): 300 ug/h via INTRAVENOUS
  Administered 2020-05-27: 400 ug/h via INTRAVENOUS
  Administered 2020-05-28: 250 ug/h via INTRAVENOUS
  Administered 2020-05-28: 350 ug/h via INTRAVENOUS
  Administered 2020-05-28: 400 ug/h via INTRAVENOUS
  Administered 2020-05-29 (×3): 350 ug/h via INTRAVENOUS
  Administered 2020-05-30: 400 ug/h via INTRAVENOUS
  Administered 2020-05-30 (×2): 350 ug/h via INTRAVENOUS
  Administered 2020-05-31 – 2020-06-04 (×15): 400 ug/h via INTRAVENOUS
  Filled 2020-05-24 (×36): qty 250

## 2020-05-24 MED ORDER — MIDAZOLAM 50MG/50ML (1MG/ML) PREMIX INFUSION
0.5000 mg/h | INTRAVENOUS | Status: DC
  Administered 2020-05-24: 6 mg/h via INTRAVENOUS
  Administered 2020-05-24: 4 mg/h via INTRAVENOUS
  Administered 2020-05-24: 10 mg/h via INTRAVENOUS
  Filled 2020-05-24 (×3): qty 50

## 2020-05-24 NOTE — Progress Notes (Signed)
Pharmacy Antibiotic Note  Brian Mack is a 34 y.o. male admitted on May 18, 2020 with covid, now with potential superimposed PNA - bacterial or fungal is a concern.   Has been on high dose solu medrol x 10 d without taper yet  SCr seems stable  Plan: Add cefepime 2 g q8h Add vanc 1 g q8h Add eraxis 200 mg x 1 100 mg q24h Monitor renal fx cx vanc lvls prn  Height: 5\' 9"  (175.3 cm) Weight: 115.8 kg (255 lb 4.7 oz) IBW/kg (Calculated) : 70.7  Temp (24hrs), Avg:97.9 F (36.6 C), Min:96.3 F (35.7 C), Max:99.2 F (37.3 C)  Recent Labs  Lab 05/20/20 0335 05/21/20 0533 05/22/20 0626 05/23/20 0805 05/24/20 0237  WBC 21.6* 24.4* 23.4* 30.0* 28.6*  CREATININE 1.14 1.07 1.06 1.10 1.38*    Estimated Creatinine Clearance: 95.5 mL/min (A) (by C-G formula based on SCr of 1.38 mg/dL (H)).    No Known Allergies  07/24/20, PharmD, BCPS, BCCCP Clinical Pharmacist [phone]  Please check AMION for all [facility] Pharm[facility] numbers  05/24/2020 8:57 AM

## 2020-05-24 NOTE — Progress Notes (Signed)
Nutrition Follow-up  DOCUMENTATION CODES:   Obesity unspecified  INTERVENTION:   Tube Feeding via OG/NG: Vital 1.5 at 55 ml/hr Pro-Source TF 90 mL TID Provides 155 g of protein, 2220 kcals and 1003 mL of free water Meets 100% estimated calorie and protein needs  Total free water from TF plus free water flushes of 200 mL q 4 hours: 2203 mL   NUTRITION DIAGNOSIS:   Inadequate oral intake related to acute illness, poor appetite as evidenced by meal completion < 25%, NPO status.  GOAL:   Patient will meet greater than or equal to 90% of their needs  MONITOR:   Labs, Weight trends, TF tolerance, Vent status, Skin  REASON FOR ASSESSMENT:   Consult, Ventilator Enteral/tube feeding initiation and management  ASSESSMENT:   34 yo unvaccinated male admitted with severe COVID-19 pneumonia on 9/2 with progressive decline requiring intubation on 9/1 with severe ARDS, worsening AKI. No PMH  9/02 Admitted 9/11 Intubated  Pt sedated on vent, on precedex  Need to confirm OG/NG access prior to initiation of TF  Free water 200 mL q 4 hours ordered to start today per tube  Pt eating very poorly since 9/04; recorded po 0-5%.  Unable to obtain diet and weight history from patient at this time  No weight since 9/03, 115.8 kg. Need new weight  Labs: CBGs 144-195 (ICU goal 140-180), BUN 22, Creatinine 1.38, sodium 135 (wdl), potassium 4.5 (wdl) Meds: ss novolog, solumedrol, miralax, colace    Diet Order:   Diet Order            Diet NPO time specified  Diet effective now                 EDUCATION NEEDS:   Not appropriate for education at this time  Skin:  Skin Assessment: Reviewed RN Assessment  Last BM:  9/09  Height:   Ht Readings from Last 1 Encounters:  05/24/20 5\' 9"  (1.753 m)    Weight:   Wt Readings from Last 1 Encounters:  05/16/20 115.8 kg    BMI:  Body mass index is 37.7 kg/m.  Estimated Nutritional Needs:   Kcal:  2150-2605  kcals  Protein:  140-180 g  Fluid:  >/= 2 L   04-21-1995 MS, RDN, LDN, CNSC Registered Dietitian III Clinical Nutrition RD Pager and On-Call Pager Number Located in [place]

## 2020-05-24 NOTE — Procedures (Addendum)
Patient was proned at 2200 without complication.  ETT was secured with cloth tape and protective pads were placed on patient's face prior to being proned.  Mouth was examined and no issue was found.  RT will continue to monitor.

## 2020-05-24 NOTE — Plan of Care (Signed)
Called friend Cala Bradford to update her on plan for intubation. Verbally consented for CVC, A-line over the phone. All questions answered.  Steffanie Dunn, DO 05/24/20 8:39 AM [facility]

## 2020-05-24 NOTE — Procedures (Signed)
Intubation Procedure Note  Castiel Lauricella  [id number]  1986-07-10  Date:05/24/20  Time:9:10 AM   Provider Performing:Kaye Luoma P [facility]    Procedure: Intubation (31500)  Indication(s) Respiratory Failure  Consent Risks of the procedure as well as the alternatives and risks of each were explained to the patient and/or caregiver.  Consent for the procedure was obtained and is signed in the bedside chart  Intubated for refractory hypoxia with saturations 73-84% on BiPAP 100% FiO2 prior to intubation.  Anesthesia Etomidate and Succinylcholine   Time Out Verified patient identification, verified procedure, site/side was marked, verified correct patient position, special equipment/implants available, medications/allergies/relevant history reviewed, required imaging and test results available.   Sterile Technique Usual hand hygeine, masks, and gloves were used   Procedure Description Patient positioned in bed supine.  Sedation given as noted above.  Patient was intubated with endotracheal tube using Glidescope.  View was Grade 1 full glottis .  Number of attempts was 1.  Colorimetric CO2 detector was consistent with tracheal placement.   Complications/Tolerance desaturated to low 70s Chest X-ray is ordered to verify placement.   EBL Minimal   Specimen(s) None  Steffanie Dunn, DO 05/24/20 9:11 AM [facility]

## 2020-05-24 NOTE — Progress Notes (Signed)
NAME:  Brian Mack, MRN:  [id number], DOB:  June 02, 1986, LOS: 9 ADMISSION DATE:  2020-05-21, CONSULTATION DATE:  2020/05/21 REFERRING MD:  Ophelia Charter, CHIEF COMPLAINT:  SOB   Brief History   34 year old unvaccinated man with no PMH presenting with severe COVID pneumonia.  History of present illness   34 yo M with hx obesity admitted 9/2 with worsening SOB after testing + for COVID-19 8/26. PCCM consulted 9/3 for SOB and agreed with current treatment of steroids, remdesivir, barcitinib, IS, supplemental O2. Reconsulted 9/9 due to hypoxemia. Transferred to ICU   Past Medical History  None  Significant Hospital Events   9/2 admitted 9/9 PCCM reconsulted  Consults:  N/A  Procedures:  N/A  Significant Diagnostic Tests:    Micro Data:  COVID +  Antimicrobials:  Ceftriaxone 9/2>> 9/6 Azithromycin 9/2>>  9/6 Cefepime 9/11> vanc 9/11> eraxis 9/11>  Interim history/subjective:  Worsening hypoxia, struggling with BiPAP overnight  Objective   Blood pressure (!) 94/55, pulse 82, temperature 99.2 F (37.3 C), temperature source Axillary, resp. rate (!) 36, height 5\' 9"  (1.753 m), weight 115.8 kg, SpO2 (!) 84 %.    Vent Mode: BIPAP;PCV FiO2 (%):  [100 %] 100 % Set Rate:  [10 bmp] 10 bmp PEEP:  [8 cmH20] 8 cmH20   Intake/Output Summary (Last 24 hours) at 05/24/2020 07/24/2020 Last data filed at 05/24/2020 0700 Gross per 24 hour  Intake 513.86 ml  Output 1975 ml  Net -1461.14 ml   Filed Weights   05/16/20 0257  Weight: 115.8 kg    Examination: Gen:      Young man sitting up in bed, mild respiratory distress, frequently dyssynchronous with BiPAP HEENT: [place]/AT, eyes anicteric, oral exam deferred due to severe hypoxia on BiPAP Lungs:    Symmetrical chest expansion, tachypnea, no accessory muscle use CV:         Tachycardic, regular rhythm Abd:      Soft, nontender, nondistended Ext: No significant edema, no clubbing or cyanosis Skin:      No rashes or wounds Neuro: Awake alert,  answering questions appropriately, moving all extremities spontaneously.  Resolved Hospital Problem list   N/A  Assessment & Plan:   COVID-19 pneumonia, severe ARDS LLL HAP -Previously completed remdesivir -Continue steroids, discontinue baricitinib due to concern for bacterial superinfection. Suspect negative PCT due to high level of immunosuppression -Decision made to intubate given poor tolerance of BiPAP, high FiO2 requirement, making him high risk for deterioration with further dyssynchrony or intolerance.  He consented verbally. -Adding empiric broad-spectrum antibiotics due to concern for HAP and immunocompromise status. -Trach aspirate -VAP prevention protocol -Low tidal volume ventilation, 4 to 8 cc/kg ideal body weight with goal plateau less than 30 and driving pressure less than 15-20.  Currently 6 cc/kg. -Prone if P:F<150 for 16 hrs. -Fentanyl and Versed for sedation.  May require NMB.   Leukocytosis, concerning for acute bacterial pneumonia -Adding empiric antibiotics -Discontinue baricitinib -Continue steroids.  Likely will de-escalate tomorrow. -Trach aspirate and blood cultures  Hyperglycemia -Adding Accu-Cheks every 4 hours with sliding scale insulin -Goal BG 140-181 admitted to the ICU  AKI, worsening Ureteral colic in setting of mid R ureter stone -- improved  -Holding Flomax while intubated -May need repeat renal ultrasound in a few days to ensure no hydronephrosis behind stone -Foley catheter -Continue to monitor -Renally dose meds and avoid nephrotoxic meds -Strict I/O  Best practice:  Diet: PO  Pain/Anxiety/Delirium protocol (if indicated): na VAP protocol (if indicated): n/a DVT  prophylaxis: lovenox GI prophylaxis: n/a Glucose control: n/a Mobility: Bedrest Code Status: full Family Communication: pt's friend Cala Bradford updated  Disposition: ICU   This patient is critically ill with multiple organ system failure which requires frequent high  complexity decision making, assessment, support, evaluation, and titration of therapies. This was completed through the application of advanced monitoring technologies and extensive interpretation of multiple databases. During this encounter critical care time was devoted to patient care services described in this note for 48 minutes.  Steffanie Dunn, DO 05/24/20 9:30 AM [facility]

## 2020-05-24 NOTE — Progress Notes (Signed)
eLink Physician-Brief Progress Note Patient Name: Caelen Reierson DOB: [birth year] MRN: [id number]   Date of Service  05/24/2020  HPI/Events of Note  Patient is now on BIPAP, needs a low dose Precedex infusion for anxiolysis.  eICU Interventions  Precedex ordered.        [name] Braden Cimo 05/24/2020, 2:45 AM

## 2020-05-24 NOTE — Progress Notes (Signed)
Patient placed on prone position by RT x 2 and RN x 5 without complications.  Patient's head turned to the left.

## 2020-05-24 NOTE — Progress Notes (Signed)
eLink Physician-Brief Progress Note Patient Name: Brian Mack DOB: [birth year] MRN: [id number]   Date of Service  05/24/2020  HPI/Events of Note  Acute hypoxemic respiratory failure secondary to Covid pneumonia, patient is hypoxemic on high flow nasal oxygen, saturation 80 %.  eICU Interventions  Trial of BIPAP.        [name] 05/24/2020, 2:22 AM

## 2020-05-24 NOTE — Consult Note (Addendum)
ECMO Consult Note   Called to L. Ernest Mallick, DO, Loralie Champagne, MD, Pierre Bali, MD and Tenny Craw, MD for ECMO Consult at (time) 6:47 PM by Julian Hy, DO Admitting Diagnosis- Covid pneumonia, acute hypoxic respiratory failure Primary Issue- acute hypoxic and hypercapneic respiratory failure, covid ARDS Age:34 y.o. Weight: 109.1kg  Days on Mechanical Ventilation- 1  MAP FiO2 Oxygen Index P/F Ratio  89 100% 25.8 93    Vasopressors no    MSOF No   RESP score (VV-ECMO) : http://www.respscore.com   -9  SAVE score (VA-ECMO) : http://www.save-score.com  Recent Blood Gas:     Component Value Date/Time   PHART 7.080 (LL) 05/24/2020 1830   PCO2ART 113 (HH) 05/24/2020 1830   PO2ART 93.4 05/24/2020 1830   HCO3 31.9 (H) 05/24/2020 1830   TCO2 32 05/24/2020 1132   O2SAT 92.3 05/24/2020 1830    Coags:    Component Value Date/Time   FIBRINOGEN 708 (H) 05/24/2020 0414    CBC    Component Value Date/Time   WBC 28.6 (H) 05/24/2020 0237   RBC 6.59 (H) 05/24/2020 0237   HGB 16.0 05/24/2020 1132   HCT 47.0 05/24/2020 1132   PLT 471 (H) 05/24/2020 0237   MCV 83.9 05/24/2020 0237   MCH 27.2 05/24/2020 0237   MCHC 32.4 05/24/2020 0237   RDW 13.1 05/24/2020 0237   LYMPHSABS 0.5 (L) 05/20/2020 0335   MONOABS 1.5 (H) 05/20/2020 0335   EOSABS 0.0 05/20/2020 0335   BASOSABS 0.1 05/20/2020 0335    BMET    Component Value Date/Time   NA 137 05/24/2020 1809   K 5.7 (H) 05/24/2020 1809   CL 96 (L) 05/24/2020 1809   CO2 30 05/24/2020 1809   GLUCOSE 257 (H) 05/24/2020 1809   BUN 28 (H) 05/24/2020 1809   CREATININE 1.30 (H) 05/24/2020 1809   CALCIUM 8.1 (L) 05/24/2020 1809   GFRNONAA >60 05/24/2020 1809   GFRAA >60 05/24/2020 1809                                                                                                                                                             ECMO physician L. Ernest Mallick, DO notified at 6:47 PM (time) Candidate meets  ECMO Criteria- Yes  Placed on ECMO watch at 7:10 PM  (time).  Additional notes- mild AKI- stable for 12 hours Reason for Consult: severe ARDS Referring Physician: Nichola Sizer, DO  Brian Mack is an 34 y.o. male.  HPI: [name] is a 34 y/o gentleman admitted for acute respiratory failure on due to Covid    History reviewed. No pertinent past medical history.  History reviewed. No pertinent surgical history.  History reviewed. No pertinent family history.  Social History:  reports that he has never smoked. He  has never used smokeless tobacco. He reports that he does not drink alcohol and does not use drugs. No tobacco history.  Allergies: No Known Allergies  Medications:  I have reviewed the patient's current medications. Prior to Admission:  Medications Prior to Admission  Medication Sig Dispense Refill Last Dose  . acetaminophen (TYLENOL) 500 MG tablet Take 500 mg by mouth every 6 (six) hours as needed.   05/14/2020 at Unknown time  . benzonatate (TESSALON) 100 MG capsule Take 1 capsule (100 mg total) by mouth every 8 (eight) hours. 21 capsule 0 05/14/2020 at Unknown time  . cholecalciferol (VITAMIN D3) 25 MCG (1000 UNIT) tablet Take 1,000 Units by mouth daily.   05/14/2020 at Unknown time  . ondansetron (ZOFRAN ODT) 4 MG disintegrating tablet Take 1 tablet (4 mg total) by mouth every 8 (eight) hours as needed for nausea or vomiting. 20 tablet 0 05/14/2020 at Unknown time  . promethazine-dextromethorphan (PROMETHAZINE-DM) 6.25-15 MG/5ML syrup Take 5 mLs by mouth 4 (four) times daily as needed for cough. 118 mL 0 05/14/2020 at Unknown time   Scheduled: . albuterol  2 puff Inhalation Q6H  . artificial tears  1 application Both Eyes W9N  . artificial tears  1 application Both Eyes L8X  . chlorhexidine gluconate (MEDLINE KIT)  15 mL Mouth Rinse BID  . Chlorhexidine Gluconate Cloth  6 each Topical Daily  . docusate  100 mg Per Tube BID  . enoxaparin (LOVENOX) injection   60 mg Subcutaneous Q24H  . feeding supplement (PROSource TF)  90 mL Per Tube TID  . fentaNYL (SUBLIMAZE) injection  50 mcg Intravenous Once  . free water  200 mL Per Tube Q4H  . insulin aspart  2-6 Units Subcutaneous Q4H  . mouth rinse  15 mL Mouth Rinse 10 times per day  . methylPREDNISolone (SOLU-MEDROL) injection  0.5 mg/kg Intravenous Q12H  . pantoprazole (PROTONIX) IV  40 mg Intravenous Daily  . polyethylene glycol  17 g Per Tube Daily  . sodium chloride flush  3 mL Intravenous Q12H  . sterile water (preservative free)      . vecuronium  0.08 mg/kg Intravenous Once   Continuous: . sodium chloride 10 mL/hr at 05/24/20 1930  . sodium chloride Stopped (05/24/20 1756)  . [START ON 05/25/2020] anidulafungin    . ceFEPime (MAXIPIME) IV Stopped (05/24/20 1731)  . feeding supplement (VITAL AF 1.2 CAL) 40 mL/hr at 05/24/20 1930  . fentaNYL infusion INTRAVENOUS 200 mcg/hr (05/24/20 1930)  . midazolam    . norepinephrine (LEVOPHED) Adult infusion    . phenylephrine    . vancomycin Stopped (05/24/20 1830)  . vecuronium (NORCURON) infusion 0.5 mcg/kg/min (05/24/20 1930)    Results for orders placed or performed during the hospital encounter of 06/05/2020 (from the past 48 hour(s))  I-STAT 7, (LYTES, BLD GAS, ICA, H+H)     Status: Abnormal   Collection Time: 05/23/20  7:50 AM  Result Value Ref Range   pH, Arterial 7.450 7.35 - 7.45   pCO2 arterial 45.1 32 - 48 mmHg   pO2, Arterial 48 (L) 83 - 108 mmHg   Bicarbonate 31.3 (H) 20.0 - 28.0 mmol/L   TCO2 33 (H) 22 - 32 mmol/L   O2 Saturation 85.0 %   Acid-Base Excess 6.0 (H) 0.0 - 2.0 mmol/L   Sodium 136 135 - 145 mmol/L   Potassium 4.3 3.5 - 5.1 mmol/L   Calcium, Ion 1.11 (L) 1.15 - 1.40 mmol/L   HCT 48.0 39 - 52 %  Hemoglobin 16.3 13.0 - 17.0 g/dL   Collection site Radial    Drawn by Operator    Sample type ARTERIAL   CBC     Status: Abnormal   Collection Time: 05/23/20  8:05 AM  Result Value Ref Range   WBC 30.0 (H) 4.0 - 10.5  K/uL   RBC 5.75 4.22 - 5.81 MIL/uL   Hemoglobin 15.7 13.0 - 17.0 g/dL   HCT 47.8 39 - 52 %   MCV 83.1 80.0 - 100.0 fL   MCH 27.3 26.0 - 34.0 pg   MCHC 32.8 30.0 - 36.0 g/dL   RDW 13.0 11.5 - 15.5 %   Platelets 434 (H) 150 - 400 K/uL   nRBC 0.0 0.0 - 0.2 %    Comment: Performed at [facility] [address], [place], [place] [address]  Comprehensive metabolic panel     Status: Abnormal   Collection Time: 05/23/20  8:05 AM  Result Value Ref Range   Sodium 135 135 - 145 mmol/L   Potassium 4.4 3.5 - 5.1 mmol/L   Chloride 97 (L) 98 - 111 mmol/L   CO2 26 22 - 32 mmol/L   Glucose, Bld 130 (H) 70 - 99 mg/dL    Comment: Glucose reference range applies only to samples taken after fasting for at least 8 hours.   BUN 21 (H) 6 - 20 mg/dL   Creatinine, Ser 1.10 0.61 - 1.24 mg/dL   Calcium 8.4 (L) 8.9 - 10.3 mg/dL   Total Protein 6.3 (L) 6.5 - 8.1 g/dL   Albumin 2.8 (L) 3.5 - 5.0 g/dL   AST 27 15 - 41 U/L   ALT 40 0 - 44 U/L   Alkaline Phosphatase 65 38 - 126 U/L   Total Bilirubin 0.9 0.3 - 1.2 mg/dL   GFR calc non Af Amer >60 >60 mL/min   GFR calc Af Amer >60 >60 mL/min   Anion gap 12 5 - 15    Comment: Performed at [facility] [address] [place], [place] [address]  Procalcitonin     Status: None   Collection Time: 05/23/20  8:05 AM  Result Value Ref Range   Procalcitonin <0.10 ng/mL    Comment:        Interpretation: PCT (Procalcitonin) <= 0.5 ng/mL: Systemic infection (sepsis) is not likely. Local bacterial infection is possible. (NOTE)       Sepsis PCT Algorithm           Lower Respiratory Tract                                      Infection PCT Algorithm    ----------------------------     ----------------------------         PCT < 0.25 ng/mL                PCT < 0.10 ng/mL          Strongly encourage             Strongly discourage   discontinuation of antibiotics    initiation of antibiotics    ----------------------------      -----------------------------       PCT 0.25 - 0.50 ng/mL            PCT 0.10 - 0.25 ng/mL               OR       >  80% decrease in PCT            Discourage initiation of                                            antibiotics      Encourage discontinuation           of antibiotics    ----------------------------     -----------------------------         PCT >= 0.50 ng/mL              PCT 0.26 - 0.50 ng/mL               AND        <80% decrease in PCT             Encourage initiation of                                             antibiotics       Encourage continuation           of antibiotics    ----------------------------     -----------------------------        PCT >= 0.50 ng/mL                  PCT > 0.50 ng/mL               AND         increase in PCT                  Strongly encourage                                      initiation of antibiotics    Strongly encourage escalation           of antibiotics                                     -----------------------------                                           PCT <= 0.25 ng/mL                                                 OR                                        > 80% decrease in PCT                                      Discontinue / Do not initiate  antibiotics  Performed at [facility], [place] [address], [place], [place] [address]   C-reactive protein     Status: Abnormal   Collection Time: 05/23/20  8:05 AM  Result Value Ref Range   CRP 9.0 (H) <1.0 mg/dL    Comment: Performed at [facility] [address], [place], [place] [address]  D-dimer, quantitative (not at [facility])     Status: Abnormal   Collection Time: 05/23/20  8:05 AM  Result Value Ref Range   D-Dimer, Quant 0.76 (H) 0.00 - 0.50 ug/mL-FEU    Comment: (NOTE) At the manufacturer cut-off of 0.50 ug/mL FEU, this assay has been documented to exclude PE with a sensitivity and negative predictive value  of 97 to 99%.  At this time, this assay has not been approved by the FDA to exclude DVT/VTE. Results should be correlated with clinical presentation. Performed at [facility], [place] [address], [place], [place] [address]   Glucose, capillary     Status: Abnormal   Collection Time: 05/23/20  7:23 PM  Result Value Ref Range   Glucose-Capillary 144 (H) 70 - 99 mg/dL    Comment: Glucose reference range applies only to samples taken after fasting for at least 8 hours.  Glucose, capillary     Status: Abnormal   Collection Time: 05/23/20 11:38 PM  Result Value Ref Range   Glucose-Capillary 195 (H) 70 - 99 mg/dL    Comment: Glucose reference range applies only to samples taken after fasting for at least 8 hours.  CBC     Status: Abnormal   Collection Time: 05/24/20  2:37 AM  Result Value Ref Range   WBC 28.6 (H) 4.0 - 10.5 K/uL   RBC 6.59 (H) 4.22 - 5.81 MIL/uL   Hemoglobin 17.9 (H) 13.0 - 17.0 g/dL   HCT 55.3 (H) 39 - 52 %   MCV 83.9 80.0 - 100.0 fL   MCH 27.2 26.0 - 34.0 pg   MCHC 32.4 30.0 - 36.0 g/dL   RDW 13.1 11.5 - 15.5 %   Platelets 471 (H) 150 - 400 K/uL   nRBC 0.0 0.0 - 0.2 %    Comment: Performed at [facility] [address] [place], [place] [address]  Comprehensive metabolic panel     Status: Abnormal   Collection Time: 05/24/20  2:37 AM  Result Value Ref Range   Sodium 136 135 - 145 mmol/L   Potassium 4.2 3.5 - 5.1 mmol/L   Chloride 93 (L) 98 - 111 mmol/L   CO2 29 22 - 32 mmol/L   Glucose, Bld 159 (H) 70 - 99 mg/dL    Comment: Glucose reference range applies only to samples taken after fasting for at least 8 hours.   BUN 22 (H) 6 - 20 mg/dL   Creatinine, Ser 1.38 (H) 0.61 - 1.24 mg/dL   Calcium 8.7 (L) 8.9 - 10.3 mg/dL   Total Protein 7.7 6.5 - 8.1 g/dL   Albumin 3.3 (L) 3.5 - 5.0 g/dL   AST 33 15 - 41 U/L   ALT 40 0 - 44 U/L   Alkaline Phosphatase 93 38 - 126 U/L   Total Bilirubin 1.0 0.3 - 1.2 mg/dL   GFR calc non Af Amer >60 >60 mL/min   GFR  calc Af Amer >60 >60 mL/min   Anion gap 14 5 - 15    Comment: Performed at [facility] [address] [place], [place] [address]  C-reactive protein     Status: Abnormal   Collection Time: 05/24/20  2:37 AM  Result Value Ref Range   CRP 9.8 (H) <1.0 mg/dL    Comment: Performed at [facility], [address], [place], [place] [address]  D-dimer, quantitative (not at [facility])     Status: Abnormal   Collection Time: 05/24/20  2:37 AM  Result Value Ref Range   D-Dimer, Quant 1.32 (H) 0.00 - 0.50 ug/mL-FEU    Comment: (NOTE) At the manufacturer cut-off of 0.50 ug/mL FEU, this assay has been documented to exclude PE with a sensitivity and negative predictive value of 97 to 99%.  At this time, this assay has not been approved by the FDA to exclude DVT/VTE. Results should be correlated with clinical presentation. Performed at [facility], [address] [place], [place] [address]   Glucose, capillary     Status: Abnormal   Collection Time: 05/24/20  3:34 AM  Result Value Ref Range   Glucose-Capillary 145 (H) 70 - 99 mg/dL    Comment: Glucose reference range applies only to samples taken after fasting for at least 8 hours.  I-STAT 7, (LYTES, BLD GAS, ICA, H+H)     Status: Abnormal   Collection Time: 05/24/20  5:03 AM  Result Value Ref Range   pH, Arterial 7.430 7.35 - 7.45   pCO2 arterial 46.3 32 - 48 mmHg   pO2, Arterial 53 (L) 83 - 108 mmHg   Bicarbonate 30.7 (H) 20.0 - 28.0 mmol/L   TCO2 32 22 - 32 mmol/L   O2 Saturation 87.0 %   Acid-Base Excess 5.0 (H) 0.0 - 2.0 mmol/L   Sodium 135 135 - 145 mmol/L   Potassium 4.5 3.5 - 5.1 mmol/L   Calcium, Ion 1.10 (L) 1.15 - 1.40 mmol/L   HCT 48.0 39 - 52 %   Hemoglobin 16.3 13.0 - 17.0 g/dL   Patient temperature 83.4 F    Collection site Radial    Drawn by RT    Sample type ARTERIAL   D-dimer, quantitative (not at [facility])     Status: Abnormal   Collection Time: 05/24/20  5:05 AM  Result Value Ref Range   D-Dimer,  Quant 1.22 (H) 0.00 - 0.50 ug/mL-FEU    Comment: SPECIMEN COLLECTED IN ANTICOAGULANT ADJUSTED TUBE DUE TO ELEVATED HEMATOCRIT (NOTE) At the manufacturer cut-off of 0.50 ug/mL FEU, this assay has been documented to exclude PE with a sensitivity and negative predictive value of 97 to 99%.  At this time, this assay has not been approved by the FDA to exclude DVT/VTE. Results should be correlated with clinical presentation. Performed at [facility], [address] [place], [place] [address]   Glucose, capillary     Status: Abnormal   Collection Time: 05/24/20  9:12 AM  Result Value Ref Range   Glucose-Capillary 162 (H) 70 - 99 mg/dL    Comment: Glucose reference range applies only to samples taken after fasting for at least 8 hours.  Glucose, capillary     Status: Abnormal   Collection Time: 05/24/20 11:17 AM  Result Value Ref Range   Glucose-Capillary 165 (H) 70 - 99 mg/dL    Comment: Glucose reference range applies only to samples taken after fasting for at least 8 hours.  I-STAT 7, (LYTES, BLD GAS, ICA, H+H)     Status: Abnormal   Collection Time: 05/24/20 11:32 AM  Result Value Ref Range   pH, Arterial 7.381 7.35 -  7.45   pCO2 arterial 51.2 (H) 32 - 48 mmHg   pO2, Arterial 53 (L) 83 - 108 mmHg   Bicarbonate 30.5 (H) 20.0 - 28.0 mmol/L   TCO2 32 22 - 32 mmol/L   O2 Saturation 86.0 %   Acid-Base Excess 4.0 (H) 0.0 - 2.0 mmol/L   Sodium 134 (L) 135 - 145 mmol/L   Potassium 4.6 3.5 - 5.1 mmol/L   Calcium, Ion 1.10 (L) 1.15 - 1.40 mmol/L   HCT 47.0 39 - 52 %   Hemoglobin 16.0 13.0 - 17.0 g/dL   Patient temperature 98.0 F    Collection site Radial    Drawn by RT    Sample type ARTERIAL   Glucose, capillary     Status: Abnormal   Collection Time: 05/24/20  3:24 PM  Result Value Ref Range   Glucose-Capillary 106 (H) 70 - 99 mg/dL    Comment: Glucose reference range applies only to samples taken after fasting for at least 8 hours.  Basic metabolic panel     Status:  Abnormal   Collection Time: 05/24/20  6:09 PM  Result Value Ref Range   Sodium 137 135 - 145 mmol/L   Potassium 5.7 (H) 3.5 - 5.1 mmol/L    Comment: NO VISIBLE HEMOLYSIS   Chloride 96 (L) 98 - 111 mmol/L   CO2 30 22 - 32 mmol/L   Glucose, Bld 257 (H) 70 - 99 mg/dL    Comment: Glucose reference range applies only to samples taken after fasting for at least 8 hours.   BUN 28 (H) 6 - 20 mg/dL   Creatinine, Ser 1.30 (H) 0.61 - 1.24 mg/dL   Calcium 8.1 (L) 8.9 - 10.3 mg/dL   GFR calc non Af Amer >60 >60 mL/min   GFR calc Af Amer >60 >60 mL/min   Anion gap 11 5 - 15    Comment: Performed at [facility] [address], [place], [place] [address]    DG Chest Port 1 View  Result Date: 05/24/2020 CLINICAL DATA:  Hypoxia. EXAM: PORTABLE CHEST 1 VIEW COMPARISON:  May 24, 2020 study obtained earlier in the day FINDINGS: Endotracheal tube tip is 4.5 cm above the carina. Nasogastric tube tip is in the superior vena cava just beyond the junction with the left innominate vein. Nasogastric tube tip and side port in the stomach. No pneumothorax. There is extensive airspace opacity throughout the left lung and right lower lung regions, similar to earlier in the day. Heart is mildly enlarged, stable. Pulmonary vascular is normal. No adenopathy. No bone lesions. IMPRESSION: Tube and catheter positions as described without pneumothorax. Persistent widespread airspace opacity consistent with multifocal pneumonia. There may be a degree of underlying ARDS. Stable cardiac prominence. No adenopathy appreciable by radiography. Electronically Signed   By: Lowella Grip III M.D.   On: 05/24/2020 17:09   DG CHEST PORT 1 VIEW  Result Date: 05/24/2020 CLINICAL DATA:  Intubation, COVID-19 EXAM: PORTABLE CHEST 1 VIEW COMPARISON:  Portable exam 1024 hours compared to 05/24/2020 at 0513 hours FINDINGS: Interval in placement of endotracheal tube with tip projecting 5.2 cm above carina. New nasogastric tube with  tip projecting over stomach. Multiple EKG leads project over chest. Enlargement of cardiac silhouette. Diffuse BILATERAL airspace infiltrates, patchy appearance LEFT greater than RIGHT, favor multifocal pneumonia and consistent with history of COVID-19 . IMPRESSION: Endotracheal and nasogastric tubes in expected positions. Patchy airspace infiltrates bilaterally LEFT greater than RIGHT consistent with multifocal pneumonia and COVID-19 .  Electronically Signed   By: Lavonia Dana M.D.   On: 05/24/2020 12:21   DG CHEST PORT 1 VIEW  Result Date: 05/24/2020 CLINICAL DATA:  Hypoxia.  COVID-19 positive EXAM: PORTABLE CHEST 1 VIEW COMPARISON:  May 22, 2020 FINDINGS: Airspace opacity throughout the lungs bilaterally, most notable in the left base, is essentially stable. No new opacity evident. Heart is prominent, stable, with pulmonary vascularity normal. No adenopathy. No bone lesions. IMPRESSION: Multifocal airspace opacity consistent with atypical organism pneumonia persists, similar to 2 days prior. Opacity most severe in left base region, stable. Stable cardiac prominence. No adenopathy evident by radiography. Electronically Signed   By: Lowella Grip III M.D.   On: 05/24/2020 07:54    Review of Systems  Unable to perform ROS: Intubated   Blood pressure 126/73, pulse (!) 111, temperature (!) 97.5 F (36.4 C), temperature source Axillary, resp. rate (!) 30, height _0  (1.753 m), weight 115.8 kg, SpO2 91 %. Physical Exam Vitals reviewed.  Constitutional:      Appearance: He is obese.     Comments: Critically ill appearing  HENT:     Head: Normocephalic and atraumatic.  Cardiovascular:     Rate and Rhythm: Normal rate and regular rhythm.     Heart sounds: No murmur heard.   Pulmonary:     Comments: Decreased in left base, rhales bilaterally, synchronous on vent. Pplat 37 Chest:     Chest wall: No deformity or tenderness.  Abdominal:     Palpations: Abdomen is soft.     Tenderness:  There is no abdominal tenderness.  Musculoskeletal:     Right lower leg: No edema.     Left lower leg: No edema.  Skin:    General: Skin is warm and dry.     Nails: There is no clubbing.  Neurological:     Comments: RASS -5 on sedation, NMB     Assessment/Plan:  Severe acute hypoxic and hypercapneic respiratory failure due to ARDS from covid 19 viral pneumonia; possible superimposed bacterial pneumonia Severe respiratory acidosis -con't ARDSNet care- LTVV focusing on goal Pplat <30, driving pressure <03 with permissive hypercapnia; NMB with heavy sedation, and prone positioning. Will re-prone tonight given lack of improvement with supination. -discussed with ECMO team- no capacity within our system to manage him on ECMO unfortunately. We recommend cannulation and ECMO care if an accepting hospital can be found. -STAT echo tonight to re-evaluate cardiac function- both RV & LV with vigorous function.  Mild AKI-- stable on repeat  -serial BMPs -renally dose meds, avoid nephrotoxic meds     Julian Hy Date: 05/24/2020 Time: 6:46 PM

## 2020-05-24 NOTE — Progress Notes (Signed)
Patient placed in supine position due to increased PIP per CCM, by RT x 1 and RN x 4 without complications.  ETT re-secured with a commercial tube holder at 27 cm.

## 2020-05-24 NOTE — Progress Notes (Signed)
Patient not compliant with Bipap. He is back on HFNC and Non-rebreather with oxygen saturations in the 80's. E-link notified, will continue to monitor.

## 2020-05-24 NOTE — Care Plan (Addendum)
Not tolerating prone ventilation due to high pressures- peak 45, plateau ~43. Not significantly better with significantly decreased Vt. Able to pass ballard, no apparent kinks in ETT. Reduced L base breath sounds, but otherwise has breath sounds bilaterally. CXR without pneumothorax.   Planning to supinate now. Stat BMP to monitor renal function since this morning.  Steffanie Dunn, DO 05/24/20 5:44 PM  Pulmonary & Critical Care   Supinated, but peak pressure remains 39, Pplat ~36. Lung [place] with lung pulse on the left, bilateral lung slide. No evidence of pneumothorax. ABG prelim PCO2>90- confirmation pending. BMP pending.  Steffanie Dunn, DO 05/24/20 6:37 PM  Pulmonary & Critical Care

## 2020-05-24 NOTE — Progress Notes (Signed)
Attempted TOF prior to starting Vecuronium gtt. Unable to obtain any twitches on highest setting of TOF. Notified Dr. Chestine Spore of this and was advised to start Vecuronium gtt without TOF and to titrate gtt to ventilator synchrony.

## 2020-05-24 NOTE — Progress Notes (Addendum)
[facility] transfer center contacted for possible transfer for VV ECMO. Spoke to [facility] CCM physician-- unable to accept transfers for ECMO due to capacity at this time.   [facility] contacted regarding possible transfer for VV ECMO- not accepting transfer for ECMO due to lack of capacity.   [facility] contacted regarding potential transfer for VV ECMO. Awaiting a call back.  Discussed his case with CCM, who will discuss with their ECMO team to determine if they have availability.  Steffanie Dunn, DO 05/24/20 9:11 PM [facility]    [facility] unable to accept transfers.  [facility] is unable to accept ECMO transfers.  Steffanie Dunn, DO 05/24/20 9:29 PM [facility]    Additional 1 hour of critical care time.    [facility]/ [facility] unable to accept transfer for VV ECMO currently.  [facility] called to request transfer- awaiting call back.  Steffanie Dunn, DO 05/24/20 9:48 PM [facility]   [facility] unable to accept transfers for ECMO.  Steffanie Dunn, DO 05/24/20 9:59 PM [facility]

## 2020-05-24 NOTE — Progress Notes (Signed)
CRITICAL VALUE ALERT  Critical Value:  PH 7.080, PCO2 113  Date & Time Notied:  9/11 @ 1855   Provider Notified: Dr. Karie Fetch

## 2020-05-24 NOTE — Progress Notes (Signed)
  Echocardiogram 2D Echocardiogram has been performed.  Gerda Diss 05/24/2020, 8:36 PM

## 2020-05-24 NOTE — Procedures (Signed)
Central Venous Catheter Insertion Procedure Note  Brian Mack  [id number]  [birth year]  Date:05/24/20  Time:2:04 PM   Provider Performing:Pete Bea Laura Tanja Port   Procedure: Insertion of Non-tunneled Central Venous [phone]) with US guidance ([id number])   Indication(s) Medication administration  Consent Risks of the procedure as well as the alternatives and risks of each were explained to the patient and/or caregiver.  Consent for the procedure was obtained and is signed in the bedside chart  Anesthesia Topical only with 1% lidocaine   Timeout Verified patient identification, verified procedure, site/side was marked, verified correct patient position, special equipment/implants available, medications/allergies/relevant history reviewed, required imaging and test results available.  Sterile Technique Maximal sterile technique including full sterile barrier drape, hand hygiene, sterile gown, sterile gloves, mask, hair covering, sterile ultrasound probe cover (if used).  Procedure Description Area of catheter insertion was cleaned with chlorhexidine and draped in sterile fashion.  With real-time ultrasound guidance a central venous catheter was placed into the left internal jugular vein. Nonpulsatile blood flow and easy flushing noted in all ports.  The catheter was sutured in place and sterile dressing applied.  Complications/Tolerance None; patient tolerated the procedure well. Chest X-ray is ordered to verify placement for internal jugular or subclavian cannulation.   Chest x-ray is not ordered for femoral cannulation.  EBL Minimal  Specimen(s) None  Brian Mack ACNP-BC [facility] Pulmonary/Critical Care Pager # [phone] OR # [phone] if no answer

## 2020-05-24 NOTE — Procedures (Signed)
Arterial Catheter Insertion Procedure Note  Brian Mack  [id number]  08-05-1986  Date:05/24/20  Time:9:33 PM    Provider Performing: Darcella Gasman Laisha Rau    Procedure: Insertion of Arterial Line (37902) with US guidance ([id number])   Indication(s) Blood pressure monitoring and/or need for frequent ABGs  Consent Unable to obtain consent due to emergent nature of procedure.  Anesthesia None   Time Out Verified patient identification, verified procedure, site/side was marked, verified correct patient position, special equipment/implants available, medications/allergies/relevant history reviewed, required imaging and test results available.   Sterile Technique Maximal sterile technique including full sterile barrier drape, hand hygiene, sterile gown, sterile gloves, mask, hair covering, sterile ultrasound probe cover (if used).   Procedure Description Area of catheter insertion was cleaned with chlorhexidine and draped in sterile fashion. With real-time ultrasound guidance an arterial catheter was placed into the left radial artery.  Appropriate arterial tracings confirmed on monitor.     Complications/Tolerance None; patient tolerated the procedure well.   EBL Minimal   Specimen(s) None   Darcella Gasman Trey Gulbranson, PA-C

## 2020-05-25 LAB — COMPREHENSIVE METABOLIC PANEL
ALT: 67 U/L — ABNORMAL HIGH (ref 0–44)
AST: 43 U/L — ABNORMAL HIGH (ref 15–41)
Albumin: 2.5 g/dL — ABNORMAL LOW (ref 3.5–5.0)
Alkaline Phosphatase: 94 U/L (ref 38–126)
Anion gap: 7 (ref 5–15)
BUN: 27 mg/dL — ABNORMAL HIGH (ref 6–20)
CO2: 39 mmol/L — ABNORMAL HIGH (ref 22–32)
Calcium: 7.5 mg/dL — ABNORMAL LOW (ref 8.9–10.3)
Chloride: 95 mmol/L — ABNORMAL LOW (ref 98–111)
Creatinine, Ser: 1.2 mg/dL (ref 0.61–1.24)
GFR calc Af Amer: >60 mL/min (ref >60)
GFR calc non Af Amer: >60 mL/min (ref >60)
Glucose, Bld: 233 mg/dL — ABNORMAL HIGH (ref 70–99)
Potassium: 5.1 mmol/L (ref 3.5–5.1)
Sodium: 141 mmol/L (ref 135–145)
Total Bilirubin: 0.7 mg/dL (ref 0.3–1.2)
Total Protein: 5.9 g/dL — ABNORMAL LOW (ref 6.5–8.1)

## 2020-05-25 LAB — MAGNESIUM: Magnesium: 2.6 mg/dL — ABNORMAL HIGH (ref 1.7–2.4)

## 2020-05-25 LAB — BLOOD GAS, ARTERIAL
Acid-Base Excess: 12.9 mmol/L — ABNORMAL HIGH (ref 0.0–2.0)
Acid-Base Excess: 14.5 mmol/L — ABNORMAL HIGH (ref 0.0–2.0)
Bicarbonate: 41.1 mmol/L — ABNORMAL HIGH (ref 20.0–28.0)
Bicarbonate: 42.5 mmol/L — ABNORMAL HIGH (ref 20.0–28.0)
Drawn by: 51702
Drawn by: 441371
FIO2: 100
FIO2: 100
O2 Saturation: 96.4 %
O2 Saturation: 98.3 %
Patient temperature: 36.5
Patient temperature: 36.7
pCO2 arterial: 105 mmHg (ref 32.0–48.0)
pCO2 arterial: 107 mmHg (ref 32.0–48.0)
pH, Arterial: 7.211 — ABNORMAL LOW (ref 7.350–7.450)
pH, Arterial: 7.222 — ABNORMAL LOW (ref 7.350–7.450)
pO2, Arterial: 94.1 mmHg (ref 83.0–108.0)
pO2, Arterial: 130 mmHg — ABNORMAL HIGH (ref 83.0–108.0)

## 2020-05-25 LAB — ECHOCARDIOGRAM LIMITED
Area-P 1/2: 4.36 cm2
Height: 69 in
S' Lateral: 2.4 cm
Weight: 3848.35 oz

## 2020-05-25 LAB — CBC
HCT: 49.3 % (ref 39.0–52.0)
Hemoglobin: 14.9 g/dL (ref 13.0–17.0)
MCH: 27.3 pg (ref 26.0–34.0)
MCHC: 30.2 g/dL (ref 30.0–36.0)
MCV: 90.5 fL (ref 80.0–100.0)
Platelets: 390 10*3/uL (ref 150–400)
RBC: 5.45 MIL/uL (ref 4.22–5.81)
RDW: 13.1 % (ref 11.5–15.5)
WBC: 33.4 10*3/uL — ABNORMAL HIGH (ref 4.0–10.5)
nRBC: 0 % (ref 0.0–0.2)

## 2020-05-25 LAB — GLUCOSE, CAPILLARY
Glucose-Capillary: 115 mg/dL — ABNORMAL HIGH (ref 70–99)
Glucose-Capillary: 118 mg/dL — ABNORMAL HIGH (ref 70–99)
Glucose-Capillary: 146 mg/dL — ABNORMAL HIGH (ref 70–99)
Glucose-Capillary: 147 mg/dL — ABNORMAL HIGH (ref 70–99)
Glucose-Capillary: 155 mg/dL — ABNORMAL HIGH (ref 70–99)
Glucose-Capillary: 169 mg/dL — ABNORMAL HIGH (ref 70–99)
Glucose-Capillary: 169 mg/dL — ABNORMAL HIGH (ref 70–99)
Glucose-Capillary: 171 mg/dL — ABNORMAL HIGH (ref 70–99)
Glucose-Capillary: 242 mg/dL — ABNORMAL HIGH (ref 70–99)

## 2020-05-25 LAB — POCT I-STAT 7, (LYTES, BLD GAS, ICA,H+H)
Acid-Base Excess: 15 mmol/L — ABNORMAL HIGH (ref 0.0–2.0)
Acid-Base Excess: 14 mmol/L — ABNORMAL HIGH (ref 0.0–2.0)
Bicarbonate: 45 mmol/L — ABNORMAL HIGH (ref 20.0–28.0)
Bicarbonate: 44.2 mmol/L — ABNORMAL HIGH (ref 20.0–28.0)
Calcium, Ion: 1.09 mmol/L — ABNORMAL LOW (ref 1.15–1.40)
Calcium, Ion: 1.13 mmol/L — ABNORMAL LOW (ref 1.15–1.40)
HCT: 43 % (ref 39.0–52.0)
HCT: 40 % (ref 39.0–52.0)
Hemoglobin: 14.6 g/dL (ref 13.0–17.0)
Hemoglobin: 13.6 g/dL (ref 13.0–17.0)
O2 Saturation: 99 %
O2 Saturation: 97 %
Patient temperature: 97.4
Patient temperature: 99
Potassium: 5 mmol/L (ref 3.5–5.1)
Potassium: 4.8 mmol/L (ref 3.5–5.1)
Sodium: 139 mmol/L (ref 135–145)
Sodium: 139 mmol/L (ref 135–145)
TCO2: 47 mmol/L — ABNORMAL HIGH (ref 22–32)
TCO2: 47 mmol/L — ABNORMAL HIGH (ref 22–32)
pCO2 arterial: 75.6 mmHg (ref 32.0–48.0)
pCO2 arterial: 83.5 mmHg (ref 32.0–48.0)
pH, Arterial: 7.379 (ref 7.350–7.450)
pH, Arterial: 7.333 — ABNORMAL LOW (ref 7.350–7.450)
pO2, Arterial: 139 mmHg — ABNORMAL HIGH (ref 83.0–108.0)
pO2, Arterial: 100 mmHg (ref 83.0–108.0)

## 2020-05-25 LAB — HEMOGLOBIN A1C
Hgb A1c MFr Bld: 6.4 % — ABNORMAL HIGH (ref 4.8–5.6)
Mean Plasma Glucose: 136.98 mg/dL

## 2020-05-25 LAB — C-REACTIVE PROTEIN: CRP: 7 mg/dL — ABNORMAL HIGH (ref <1.0)

## 2020-05-25 LAB — D-DIMER, QUANTITATIVE: D-Dimer, Quant: 1.79 ug/mL-FEU — ABNORMAL HIGH (ref 0.00–0.50)

## 2020-05-25 LAB — PHOSPHORUS: Phosphorus: 5.8 mg/dL — ABNORMAL HIGH (ref 2.5–4.6)

## 2020-05-25 MED ORDER — HYDROCOD POLST-CPM POLST ER 10-8 MG/5ML PO SUER: 5.0000 mL | Freq: Two times a day (BID) | ORAL | Status: DC | PRN

## 2020-05-25 MED ORDER — INSULIN ASPART 100 UNIT/ML ~~LOC~~ SOLN
0.0000 [IU] | SUBCUTANEOUS | Status: DC
  Administered 2020-05-25: 2 [IU] via SUBCUTANEOUS
  Administered 2020-05-25 – 2020-05-26 (×2): 3 [IU] via SUBCUTANEOUS
  Administered 2020-05-26: 2 [IU] via SUBCUTANEOUS
  Administered 2020-05-26: 3 [IU] via SUBCUTANEOUS
  Administered 2020-05-26 – 2020-05-27 (×4): 2 [IU] via SUBCUTANEOUS
  Administered 2020-05-27: 3 [IU] via SUBCUTANEOUS
  Administered 2020-05-27 (×3): 2 [IU] via SUBCUTANEOUS
  Administered 2020-05-28: 5 [IU] via SUBCUTANEOUS
  Administered 2020-05-28: 2 [IU] via SUBCUTANEOUS
  Administered 2020-05-28: 5 [IU] via SUBCUTANEOUS
  Administered 2020-05-29: 3 [IU] via SUBCUTANEOUS
  Administered 2020-05-29: 2 [IU] via SUBCUTANEOUS
  Administered 2020-05-29 (×2): 3 [IU] via SUBCUTANEOUS
  Administered 2020-05-30: 2 [IU] via SUBCUTANEOUS
  Administered 2020-05-30 (×2): 3 [IU] via SUBCUTANEOUS
  Administered 2020-05-31: 2 [IU] via SUBCUTANEOUS
  Administered 2020-05-31: 3 [IU] via SUBCUTANEOUS
  Administered 2020-05-31: 2 [IU] via SUBCUTANEOUS
  Administered 2020-05-31: 3 [IU] via SUBCUTANEOUS
  Administered 2020-05-31: 5 [IU] via SUBCUTANEOUS
  Administered 2020-06-01: 3 [IU] via SUBCUTANEOUS
  Administered 2020-06-01: 5 [IU] via SUBCUTANEOUS
  Administered 2020-06-01: 3 [IU] via SUBCUTANEOUS
  Administered 2020-06-01 (×3): 2 [IU] via SUBCUTANEOUS
  Administered 2020-06-01: 3 [IU] via SUBCUTANEOUS
  Administered 2020-06-02: 2 [IU] via SUBCUTANEOUS
  Administered 2020-06-02: 3 [IU] via SUBCUTANEOUS
  Administered 2020-06-02 (×2): 2 [IU] via SUBCUTANEOUS
  Administered 2020-06-03 (×2): 3 [IU] via SUBCUTANEOUS
  Administered 2020-06-03: 5 [IU] via SUBCUTANEOUS
  Administered 2020-06-03: 2 [IU] via SUBCUTANEOUS
  Administered 2020-06-04 (×4): 3 [IU] via SUBCUTANEOUS
  Administered 2020-06-04: 2 [IU] via SUBCUTANEOUS
  Administered 2020-06-04 – 2020-06-05 (×2): 3 [IU] via SUBCUTANEOUS
  Administered 2020-06-05: 2 [IU] via SUBCUTANEOUS
  Administered 2020-06-05: 3 [IU] via SUBCUTANEOUS
  Administered 2020-06-05: 2 [IU] via SUBCUTANEOUS
  Administered 2020-06-05 – 2020-06-06 (×5): 3 [IU] via SUBCUTANEOUS
  Administered 2020-06-06: 2 [IU] via SUBCUTANEOUS
  Administered 2020-06-06: 3 [IU] via SUBCUTANEOUS
  Administered 2020-06-07 (×4): 2 [IU] via SUBCUTANEOUS
  Administered 2020-06-07: 3 [IU] via SUBCUTANEOUS
  Administered 2020-06-07 – 2020-06-08 (×3): 2 [IU] via SUBCUTANEOUS
  Administered 2020-06-09: 3 [IU] via SUBCUTANEOUS
  Administered 2020-06-09: 2 [IU] via SUBCUTANEOUS
  Administered 2020-06-09: 3 [IU] via SUBCUTANEOUS
  Administered 2020-06-09 – 2020-06-10 (×2): 2 [IU] via SUBCUTANEOUS
  Administered 2020-06-10 (×3): 3 [IU] via SUBCUTANEOUS
  Administered 2020-06-11: 5 [IU] via SUBCUTANEOUS
  Administered 2020-06-11: 3 [IU] via SUBCUTANEOUS
  Administered 2020-06-11: 2 [IU] via SUBCUTANEOUS
  Administered 2020-06-11: 3 [IU] via SUBCUTANEOUS
  Administered 2020-06-11: 5 [IU] via SUBCUTANEOUS
  Administered 2020-06-11: 2 [IU] via SUBCUTANEOUS
  Administered 2020-06-12: 3 [IU] via SUBCUTANEOUS
  Administered 2020-06-12: 5 [IU] via SUBCUTANEOUS
  Administered 2020-06-12: 3 [IU] via SUBCUTANEOUS
  Administered 2020-06-12: 8 [IU] via SUBCUTANEOUS
  Administered 2020-06-12: 3 [IU] via SUBCUTANEOUS
  Administered 2020-06-12 – 2020-06-13 (×3): 5 [IU] via SUBCUTANEOUS
  Administered 2020-06-13 (×3): 8 [IU] via SUBCUTANEOUS
  Administered 2020-06-14: 5 [IU] via SUBCUTANEOUS
  Administered 2020-06-14: 8 [IU] via SUBCUTANEOUS
  Administered 2020-06-14 (×2): 5 [IU] via SUBCUTANEOUS
  Administered 2020-06-14 – 2020-06-15 (×2): 8 [IU] via SUBCUTANEOUS
  Administered 2020-06-15: 3 [IU] via SUBCUTANEOUS
  Administered 2020-06-15: 8 [IU] via SUBCUTANEOUS

## 2020-05-25 MED ORDER — BARICITINIB 2 MG PO TABS
4.0000 mg | ORAL_TABLET | Freq: Every day | ORAL | Status: DC
  Administered 2020-05-25: 4 mg via ORAL
  Filled 2020-05-25: qty 2

## 2020-05-25 MED ORDER — SODIUM CHLORIDE 0.9% FLUSH: 10.0000 mL | INTRAVENOUS | Status: DC | PRN

## 2020-05-25 MED ORDER — SODIUM BICARBONATE 8.4 % IV SOLN
200.0000 meq | Freq: Once | INTRAVENOUS | Status: AC
  Administered 2020-05-25: 200 meq via INTRAVENOUS
  Filled 2020-05-25: qty 200

## 2020-05-25 MED ORDER — BARICITINIB 2 MG PO TABS
4.0000 mg | ORAL_TABLET | Freq: Every day | ORAL | Status: AC
  Administered 2020-05-26 – 2020-05-29 (×4): 4 mg
  Filled 2020-05-25 (×4): qty 2

## 2020-05-25 MED ORDER — STERILE WATER FOR INJECTION IV SOLN
INTRAVENOUS | Status: DC
  Filled 2020-05-25 (×3): qty 850

## 2020-05-25 MED ORDER — METHYLPREDNISOLONE SODIUM SUCC 125 MG IJ SOLR
0.5000 mg/kg | Freq: Two times a day (BID) | INTRAMUSCULAR | Status: DC
  Administered 2020-05-25 – 2020-05-26 (×3): 58.75 mg via INTRAVENOUS
  Filled 2020-05-25 (×3): qty 2

## 2020-05-25 MED ORDER — SODIUM CHLORIDE 0.9% FLUSH
10.0000 mL | Freq: Two times a day (BID) | INTRAVENOUS | Status: DC
  Administered 2020-05-25: 10 mL
  Administered 2020-05-26: 20 mL
  Administered 2020-05-26 – 2020-06-04 (×12): 10 mL
  Administered 2020-06-04: 20 mL

## 2020-05-25 NOTE — Progress Notes (Signed)
Updated [facility] with ABG and that radiology had made imaging available to their system for review. They are unable to accept for ECMO transfer at this time.  Steffanie Dunn, DO 05/25/20 1:25 PM [place] [facility]

## 2020-05-25 NOTE — Progress Notes (Signed)
RTx2 and RNx4 turned patient supine @1530  without complication. RT will continue to monitor.

## 2020-05-25 NOTE — Progress Notes (Signed)
NAME:  Brian Mack, MRN:  [id number], DOB:  04-24-1986, LOS: 10 ADMISSION DATE:  06/01/2020, CONSULTATION DATE:  06/09/2020 REFERRING MD:  Ophelia Charter, CHIEF COMPLAINT:  SOB   Brief History   34 year old unvaccinated man with no PMH presenting with severe COVID pneumonia.  History of present illness   34 yo M with hx obesity admitted 9/2 with worsening SOB after testing + for COVID-19 8/26. PCCM consulted 9/3 for SOB and agreed with current treatment of steroids, remdesivir, barcitinib, IS, supplemental O2. Reconsulted 9/9 due to hypoxemia. Transferred to ICU   Past Medical History  None  Significant Hospital Events   9/2 admitted 9/9 PCCM reconsulted  Consults:  N/A  Procedures:  ETT 9/11 CVC 9/11 A-line 9/11  Significant Diagnostic Tests:    Micro Data:  COVID + Blood cx 9/11> resp cx 9/11>   Antimicrobials:  Ceftriaxone 9/2>> 9/6 Azithromycin 9/2>>  9/6 Cefepime 9/11> vanc 9/11> eraxis 9/11>  Interim history/subjective:  Remained proned overnight, on iNO, under NMB. Bicarb gtt started overnight   Objective   Blood pressure 128/63, pulse 95, temperature 98.3 F (36.8 C), temperature source Axillary, resp. rate (!) 35, height 5\' 9"  (1.753 m), weight 109.1 kg, SpO2 95 %.    Vent Mode: PRVC FiO2 (%):  [100 %] 100 % Set Rate:  [30 bmp-35 bmp] 35 bmp Vt Set:  [350 mL-420 mL] 350 mL PEEP:  [16 cmH20] 16 cmH20 Plateau Pressure:  [35 cmH20-43 cmH20] 35 cmH20   Intake/Output Summary (Last 24 hours) at 05/25/2020 0942 Last data filed at 05/25/2020 07/25/2020 Gross per 24 hour  Intake 4231.82 ml  Output 1019 ml  Net 3212.82 ml   Filed Weights   05/16/20 0257 05/24/20 1845  Weight: 115.8 kg 109.1 kg    Examination: Gen:      Young antibiotics in a day laying in bed intubated, sedated, under NMB, chronically ill-appearing.  Examined in prone position. HEENT: [place]/AT, scleral edema and reactive pupil.  Left eye not visualized at this time. Lungs:   Rales bilaterally,  tachypnea, breathing synchronously with the ventilator.  Plateau pressure 35. CV:         Regular rate and rhythm, sinus rhythm on telemetry Abd:      Limited exam due to chronic, soft, nondistended.  Tolerating tube feeds. Ext: No significant peripheral edema, no clubbing or cyanosis.  Good distal perfusion to left radial A-line. Skin:      Cool, warm and dry, no mottling. Neuro: RASS -5, under neuromuscular blockade, right pupil reactive.  Resolved Hospital Problem list   N/A  Assessment & Plan:   COVID-19 pneumonia, severe ARDS LLL HAP possible -Previously completed remdesivir -Continue steroids.  Baaricitinib had previously discontinued due to concern for bacterial superinfection.  Procalcitonin may be related to high level of immunosuppression. Restarting baricitinib with NG in cultures at 24 hours. -Continue to follow respiratory cultures and blood cultures. Con't empiric antibiotics.  -Continue low tidal line ventilation, appropriate diffuse reviewed radial body weight with goal plateau 30 driving pressure less than 15.  Not meeting goals due to severe hypercapnia limiting hydration ventilation.  Permissive hypercapnia with goal PCO2 85 or less and pH greater than 7.1. -VAP prevention protocol -Prone if P:F<150 for 16 hrs. -Fentanyl and Versed for sedation.  Continue neuromuscular blockade continuous infusion. -Spoke to [facility] CCM team, awaiting answer from ECMO team regarding possible transfer. No ICU beds available currently. Girlfriend [facility]  verbally consented for transfer to [facility] for ECMO if available. We  discussed his care plan and that he is on maximal medical therapy    Leukocytosis, concerning for acute bacterial pneumonia -Con't empiric antibiotics for another 24 hours.  -Restart baricitinib given neg cultures at 24 hrs prev stopped 9/11 due to concern for bacterrial super-infection -Continue high dose steroids -follow up trach aspirate and blood  cultures  Hyperglycemia -Adding Accu-Cheks every 4 hours with sliding scale insulin -Goal BG 140-180 while admitted to the ICU  AKI, worsening Ureteral colic in setting of mid R ureter stone -- improved  -Holding Flomax while intubated -May need repeat renal ultrasound in a few days to ensure no hydronephrosis behind stone -Foley catheter -Continue to monitor -Renally dose meds and avoid nephrotoxic meds -Strict I/O  GoC -con't all aggressive care measures  Best practice:  Diet: PO  Pain/Anxiety/Delirium protocol (if indicated): na VAP protocol (if indicated): n/a DVT prophylaxis: lovenox GI prophylaxis: n/a Glucose control: n/a Mobility: Bedrest Code Status: full Family Communication: pt's s/o Cala Bradford updated via phone 9/11 Disposition: ICU   This patient is critically ill with multiple organ system failure which requires frequent high complexity decision making, assessment, support, evaluation, and titration of therapies. This was completed through the application of advanced monitoring technologies and extensive interpretation of multiple databases. During this encounter critical care time was devoted to patient care services described in this note for 55 minutes.  Steffanie Dunn, DO 05/25/20 10:17 AM [place] [facility]

## 2020-05-25 NOTE — Progress Notes (Signed)
CRITICAL VALUE ALERT  Critical Value:  PH 7.084, PCO2 116  Date & Time Notied:  9/12 @ 0004  Provider Notified: E-Link MD

## 2020-05-25 NOTE — Procedures (Signed)
Patients head was turned to the right.  RT will continue to monitor.

## 2020-05-25 NOTE — Progress Notes (Signed)
eLink Physician-Brief Progress Note Patient Name: Brian Mack DOB: 12/20/1985 MRN: [id number]   Date of Service  05/25/2020  HPI/Events of Note  ABG on 100%/PRVC 30/TV 350/P 16 = 7.084/116/39.9.  eICU Interventions  Plan: 1. Increase PRVC rate to 35. 2. NaHCO3 200 meq IV now. 3. NaHCO3 IV infusion to run at 100 mL/hour. 4. Repeat ABG at 3 AM.      Intervention Category Major Interventions: Acid-Base disturbance - evaluation and management;Respiratory failure - evaluation and management  Lenell Antu 05/25/2020, 12:27 AM

## 2020-05-25 NOTE — Progress Notes (Signed)
eLink Physician-Brief Progress Note Patient Name: Brian Mack DOB: 04/09/1986 MRN: [id number]   Date of Service  05/25/2020  HPI/Events of Note  Patient on NMB with BIS = 68.   eICU Interventions  Plan: 1. Increase Fentanyl IV infusion ceiling to 400 mcg/hour.      Intervention Category Major Interventions: Delirium, psychosis, severe agitation - evaluation and management  Lenell Antu 05/25/2020, 8:47 PM

## 2020-05-26 ENCOUNTER — Encounter (HOSPITAL_COMMUNITY): Payer: Self-pay | Admitting: *Deleted

## 2020-05-26 LAB — CULTURE, RESPIRATORY W GRAM STAIN
Culture: NORMAL
Gram Stain: NONE SEEN

## 2020-05-26 LAB — COOXEMETRY PANEL
Carboxyhemoglobin: 0.7 % (ref 0.5–1.5)
Methemoglobin: 1 % (ref 0.0–1.5)
O2 Saturation: 98.8 %
Total hemoglobin: 13.5 g/dL (ref 12.0–16.0)

## 2020-05-26 LAB — BASIC METABOLIC PANEL
Anion gap: 8 (ref 5–15)
BUN: 26 mg/dL — ABNORMAL HIGH (ref 6–20)
CO2: 39 mmol/L — ABNORMAL HIGH (ref 22–32)
Calcium: 8.1 mg/dL — ABNORMAL LOW (ref 8.9–10.3)
Chloride: 94 mmol/L — ABNORMAL LOW (ref 98–111)
Creatinine, Ser: 0.92 mg/dL (ref 0.61–1.24)
GFR calc Af Amer: >60 mL/min (ref >60)
GFR calc non Af Amer: >60 mL/min (ref >60)
Glucose, Bld: 175 mg/dL — ABNORMAL HIGH (ref 70–99)
Potassium: 5 mmol/L (ref 3.5–5.1)
Sodium: 141 mmol/L (ref 135–145)

## 2020-05-26 LAB — GLUCOSE, CAPILLARY
Glucose-Capillary: 129 mg/dL — ABNORMAL HIGH (ref 70–99)
Glucose-Capillary: 133 mg/dL — ABNORMAL HIGH (ref 70–99)
Glucose-Capillary: 139 mg/dL — ABNORMAL HIGH (ref 70–99)
Glucose-Capillary: 141 mg/dL — ABNORMAL HIGH (ref 70–99)
Glucose-Capillary: 153 mg/dL — ABNORMAL HIGH (ref 70–99)
Glucose-Capillary: 176 mg/dL — ABNORMAL HIGH (ref 70–99)

## 2020-05-26 LAB — POCT I-STAT 7, (LYTES, BLD GAS, ICA,H+H)
Acid-Base Excess: 13 mmol/L — ABNORMAL HIGH (ref 0.0–2.0)
Bicarbonate: 43.5 mmol/L — ABNORMAL HIGH (ref 20.0–28.0)
Calcium, Ion: 1.14 mmol/L — ABNORMAL LOW (ref 1.15–1.40)
HCT: 40 % (ref 39.0–52.0)
Hemoglobin: 13.6 g/dL (ref 13.0–17.0)
O2 Saturation: 94 %
Patient temperature: 99.4
Potassium: 5 mmol/L (ref 3.5–5.1)
Sodium: 139 mmol/L (ref 135–145)
TCO2: 46 mmol/L — ABNORMAL HIGH (ref 22–32)
pCO2 arterial: 88.1 mmHg (ref 32.0–48.0)
pH, Arterial: 7.304 — ABNORMAL LOW (ref 7.350–7.450)
pO2, Arterial: 84 mmHg (ref 83.0–108.0)

## 2020-05-26 LAB — D-DIMER, QUANTITATIVE: D-Dimer, Quant: 1.83 ug/mL-FEU — ABNORMAL HIGH (ref 0.00–0.50)

## 2020-05-26 LAB — MAGNESIUM: Magnesium: 2.4 mg/dL (ref 1.7–2.4)

## 2020-05-26 LAB — PHOSPHORUS: Phosphorus: 3.5 mg/dL (ref 2.5–4.6)

## 2020-05-26 LAB — C-REACTIVE PROTEIN: CRP: 3.8 mg/dL — ABNORMAL HIGH (ref <1.0)

## 2020-05-26 MED ORDER — FUROSEMIDE 10 MG/ML IJ SOLN
40.0000 mg | Freq: Once | INTRAMUSCULAR | Status: AC
  Administered 2020-05-26: 40 mg via INTRAVENOUS
  Filled 2020-05-26: qty 4

## 2020-05-26 MED ORDER — METHYLPREDNISOLONE SODIUM SUCC 40 MG IJ SOLR
40.0000 mg | Freq: Two times a day (BID) | INTRAMUSCULAR | Status: DC
  Administered 2020-05-26 – 2020-05-27 (×2): 40 mg via INTRAVENOUS
  Filled 2020-05-26 (×2): qty 1

## 2020-05-26 MED ORDER — ENOXAPARIN SODIUM 60 MG/0.6ML ~~LOC~~ SOLN
50.0000 mg | SUBCUTANEOUS | Status: DC
  Administered 2020-05-27 – 2020-06-15 (×19): 50 mg via SUBCUTANEOUS
  Filled 2020-05-26 (×20): qty 0.6

## 2020-05-26 MED ORDER — ALBUTEROL SULFATE (2.5 MG/3ML) 0.083% IN NEBU
2.5000 mg | INHALATION_SOLUTION | Freq: Four times a day (QID) | RESPIRATORY_TRACT | Status: DC
  Administered 2020-05-26 – 2020-05-29 (×12): 2.5 mg via RESPIRATORY_TRACT
  Filled 2020-05-26 (×12): qty 3

## 2020-05-26 NOTE — Procedures (Signed)
Patients head turned to the right.  Patient tolerated well.  RT will continue to monitor.

## 2020-05-26 NOTE — Progress Notes (Signed)
RTx2 & RNx4 turned patient supine @1620  without complication. RT will continue to monitor.

## 2020-05-26 NOTE — Progress Notes (Signed)
Proned pt with RNx4 and RTx2. Head turned to the left. Suctioned mouth and airway. Pt tolerated well.

## 2020-05-26 NOTE — Plan of Care (Signed)
Updated s/o Cala Bradford over the phone. Requesting letter to be sent to his work  Set [employer]) documenting that he is in the hospital. She will call back with the fax number.  Steffanie Dunn, DO 05/26/20 4:36 PM [place] [facility]

## 2020-05-26 NOTE — Progress Notes (Signed)
NAME:  Brian Mack, MRN:  [id number], DOB:  05/12/1986, LOS: 11 ADMISSION DATE:  05/20/2020, CONSULTATION DATE:  06/12/2020 REFERRING MD:  Ophelia Charter, CHIEF COMPLAINT:  SOB   Brief History   34 year old unvaccinated man with no PMH presenting with severe COVID pneumonia.  History of present illness   34 yo M with hx obesity admitted 9/2 with worsening SOB after testing + for COVID-19 8/26. PCCM consulted 9/3 for SOB and agreed with current treatment of steroids, remdesivir, barcitinib, IS, supplemental O2. Reconsulted 9/9 due to hypoxemia. Transferred to ICU   Past Medical History  None  Significant Hospital Events   9/2 admitted 9/9 PCCM reconsulted  Consults:  N/A  Procedures:  ETT 9/11 CVC 9/11 A-line 9/11  Significant Diagnostic Tests:    Micro Data:  COVID + Blood cx 9/11> resp cx 9/11> normal flora   Antimicrobials:  Ceftriaxone 9/2>> 9/6 Azithromycin 9/2>>  9/6 Cefepime 9/11> vanc 9/11> eraxis 9/11>9/13  Interim history/subjective:  Remains on iNO, under NMB, proned.   Objective   Blood pressure (!) 148/75, pulse 65, temperature 97.7 F (36.5 C), temperature source Oral, resp. rate (!) 35, height 5\' 9"  (1.753 m), weight 108.2 kg, SpO2 96 %.    Vent Mode: PRVC FiO2 (%):  [80 %] 80 % Set Rate:  [35 bmp] 35 bmp Vt Set:  [340 mL-350 mL] 340 mL PEEP:  [16 cmH20] 16 cmH20 Plateau Pressure:  [27 cmH20-32 cmH20] 30 cmH20   Intake/Output Summary (Last 24 hours) at 05/26/2020 1101 Last data filed at 05/26/2020 1000 Gross per 24 hour  Intake 4103.81 ml  Output 1435 ml  Net 2668.81 ml   Filed Weights   05/16/20 0257 05/24/20 1845 05/26/20 0500  Weight: 115.8 kg 109.1 kg 108.2 kg    Examination: Gen:   Critically ill-appearing young man lying in bed intubated, sedated, in prone position HEENT: [name], scleral edema with pinpoint pupils bilaterally.  Clear nasal drainage.  Endotracheal tube in place. Lungs: Equal breath sounds bilaterally, tachypnea breathing  synchronously with the ventilator.  Plateau pressure 30, driving pressure 14 CV:         Regular rate and rhythm Abd:      Limited exam due to positioning, soft Ext: No significant edema, no clubbing or cyanosis.  Left radial A-line with good distal perfusion. Skin:      Warm and dry Neuro: RASS -5, under NMB, pinpoint pupils bilaterally  Resolved Hospital Problem list   N/A  Assessment & Plan:   COVID-19 pneumonia, severe ARDS LLL HAP possible -Previously completed remdesivir -Continue steroids- can titrate down to Solu-Medrol 40 mg twice daily.  Con't baricitinib. -Continue to follow respiratory cultures and blood cultures. Con't empiric cefepime and Vanco discontinue Eraxis. -Continue low tidal line ventilation, appropriate diffuse reviewed radial body weight with goal plateau 30 driving pressure less than 15.  Now meeting goals with low tidal volume.  Continue permissive hypercapnia with goal PCO2 85 or less and pH greater than 7.1.   -Continue inhaled nitric oxide -VAP prevention protocol -Continue proning per protocol-prone if P:F<150 for 16 hrs. -Fentanyl and Versed for sedation.  Continue neuromuscular blockade continuous infusion. -Lasix with goal to maintain euvolemia   Leukocytosis, concerning for acute bacterial pneumonia -Con't empiric antibiotics for another 24 hours.  -Okay to continue baricitinib -Continue high dose steroids -follow up trach aspirate and blood cultures  Hyperglycemia-controlled -Accu-Cheks every 4 hours with sliding scale insulin -Goal BG 140-180 while admitted to the ICU  AKI, stable Ureteral  colic in setting of mid R ureter stone -- improved  -Holding Flomax while intubated -Repeat BMP today and continue serial monitoring -May need repeat renal ultrasound in a few days to ensure no hydronephrosis behind stone -Foley catheter -Continue to monitor -Renally dose meds and avoid nephrotoxic meds -Strict I/Os, especially with diuresis.  Goal for  euvolemia.  GoC -con't all aggressive care measures  Best practice:  Diet: PO  Pain/Anxiety/Delirium protocol (if indicated): na VAP protocol (if indicated): n/a DVT prophylaxis: lovenox GI prophylaxis: n/a Glucose control: n/a Mobility: Bedrest Code Status: full Family Communication: pt's s/o Cala Bradford  Disposition: ICU   This patient is critically ill with multiple organ system failure which requires frequent high complexity decision making, assessment, support, evaluation, and titration of therapies. This was completed through the application of advanced monitoring technologies and extensive interpretation of multiple databases. During this encounter critical care time was devoted to patient care services described in this note for 37 minutes.  Steffanie Dunn, DO 05/26/20 11:23 AM [place] [facility]

## 2020-05-26 NOTE — Progress Notes (Signed)
RT and RNx2 turned patient's head to the left @0958  without complication. RT will continue to monitor.

## 2020-05-27 DIAGNOSIS — Z978 Presence of other specified devices: Secondary | ICD-10-CM

## 2020-05-27 DIAGNOSIS — J80 Acute respiratory distress syndrome: Secondary | ICD-10-CM

## 2020-05-27 DIAGNOSIS — G9340 Encephalopathy, unspecified: Secondary | ICD-10-CM

## 2020-05-27 DIAGNOSIS — J9602 Acute respiratory failure with hypercapnia: Secondary | ICD-10-CM

## 2020-05-27 LAB — BASIC METABOLIC PANEL
Anion gap: 7 (ref 5–15)
BUN: 25 mg/dL — ABNORMAL HIGH (ref 6–20)
CO2: 40 mmol/L — ABNORMAL HIGH (ref 22–32)
Calcium: 8.1 mg/dL — ABNORMAL LOW (ref 8.9–10.3)
Chloride: 92 mmol/L — ABNORMAL LOW (ref 98–111)
Creatinine, Ser: 0.91 mg/dL (ref 0.61–1.24)
GFR calc Af Amer: >60 mL/min (ref >60)
GFR calc non Af Amer: >60 mL/min (ref >60)
Glucose, Bld: 186 mg/dL — ABNORMAL HIGH (ref 70–99)
Potassium: 4.8 mmol/L (ref 3.5–5.1)
Sodium: 139 mmol/L (ref 135–145)

## 2020-05-27 LAB — POCT I-STAT 7, (LYTES, BLD GAS, ICA,H+H)
Acid-Base Excess: 16 mmol/L — ABNORMAL HIGH (ref 0.0–2.0)
Acid-Base Excess: 15 mmol/L — ABNORMAL HIGH (ref 0.0–2.0)
Acid-Base Excess: 16 mmol/L — ABNORMAL HIGH (ref 0.0–2.0)
Acid-Base Excess: 13 mmol/L — ABNORMAL HIGH (ref 0.0–2.0)
Bicarbonate: 46.1 mmol/L — ABNORMAL HIGH (ref 20.0–28.0)
Bicarbonate: 44.8 mmol/L — ABNORMAL HIGH (ref 20.0–28.0)
Bicarbonate: 43.6 mmol/L — ABNORMAL HIGH (ref 20.0–28.0)
Bicarbonate: 41.8 mmol/L — ABNORMAL HIGH (ref 20.0–28.0)
Calcium, Ion: 1.16 mmol/L (ref 1.15–1.40)
Calcium, Ion: 1.13 mmol/L — ABNORMAL LOW (ref 1.15–1.40)
Calcium, Ion: 1.07 mmol/L — ABNORMAL LOW (ref 1.15–1.40)
Calcium, Ion: 1.16 mmol/L (ref 1.15–1.40)
HCT: 39 % (ref 39.0–52.0)
HCT: 40 % (ref 39.0–52.0)
HCT: 38 % — ABNORMAL LOW (ref 39.0–52.0)
HCT: 38 % — ABNORMAL LOW (ref 39.0–52.0)
Hemoglobin: 13.3 g/dL (ref 13.0–17.0)
Hemoglobin: 13.6 g/dL (ref 13.0–17.0)
Hemoglobin: 12.9 g/dL — ABNORMAL LOW (ref 13.0–17.0)
Hemoglobin: 12.9 g/dL — ABNORMAL LOW (ref 13.0–17.0)
O2 Saturation: 98 %
O2 Saturation: 94 %
O2 Saturation: 96 %
O2 Saturation: 96 %
Patient temperature: 99
Patient temperature: 99.3
Patient temperature: 97.4
Patient temperature: 99
Potassium: 4.7 mmol/L (ref 3.5–5.1)
Potassium: 4.8 mmol/L (ref 3.5–5.1)
Potassium: 4.6 mmol/L (ref 3.5–5.1)
Potassium: 4.3 mmol/L (ref 3.5–5.1)
Sodium: 135 mmol/L (ref 135–145)
Sodium: 136 mmol/L (ref 135–145)
Sodium: 136 mmol/L (ref 135–145)
Sodium: 136 mmol/L (ref 135–145)
TCO2: 49 mmol/L — ABNORMAL HIGH (ref 22–32)
TCO2: 47 mmol/L — ABNORMAL HIGH (ref 22–32)
TCO2: 46 mmol/L — ABNORMAL HIGH (ref 22–32)
TCO2: 44 mmol/L — ABNORMAL HIGH (ref 22–32)
pCO2 arterial: 82.3 mmHg (ref 32.0–48.0)
pCO2 arterial: 85.9 mmHg (ref 32.0–48.0)
pCO2 arterial: 62.4 mmHg — ABNORMAL HIGH (ref 32.0–48.0)
pCO2 arterial: 71.5 mmHg (ref 32.0–48.0)
pH, Arterial: 7.357 (ref 7.350–7.450)
pH, Arterial: 7.328 — ABNORMAL LOW (ref 7.350–7.450)
pH, Arterial: 7.45 (ref 7.350–7.450)
pH, Arterial: 7.376 (ref 7.350–7.450)
pO2, Arterial: 119 mmHg — ABNORMAL HIGH (ref 83.0–108.0)
pO2, Arterial: 81 mmHg — ABNORMAL LOW (ref 83.0–108.0)
pO2, Arterial: 77 mmHg — ABNORMAL LOW (ref 83.0–108.0)
pO2, Arterial: 92 mmHg (ref 83.0–108.0)

## 2020-05-27 LAB — GLUCOSE, CAPILLARY
Glucose-Capillary: 100 mg/dL — ABNORMAL HIGH (ref 70–99)
Glucose-Capillary: 121 mg/dL — ABNORMAL HIGH (ref 70–99)
Glucose-Capillary: 129 mg/dL — ABNORMAL HIGH (ref 70–99)
Glucose-Capillary: 137 mg/dL — ABNORMAL HIGH (ref 70–99)
Glucose-Capillary: 148 mg/dL — ABNORMAL HIGH (ref 70–99)
Glucose-Capillary: 175 mg/dL — ABNORMAL HIGH (ref 70–99)

## 2020-05-27 LAB — CBC
HCT: 44 % (ref 39.0–52.0)
Hemoglobin: 12.9 g/dL — ABNORMAL LOW (ref 13.0–17.0)
MCH: 27 pg (ref 26.0–34.0)
MCHC: 29.3 g/dL — ABNORMAL LOW (ref 30.0–36.0)
MCV: 92.1 fL (ref 80.0–100.0)
Platelets: 304 10*3/uL (ref 150–400)
RBC: 4.78 MIL/uL (ref 4.22–5.81)
RDW: 12.6 % (ref 11.5–15.5)
WBC: 19.7 10*3/uL — ABNORMAL HIGH (ref 4.0–10.5)
nRBC: 0 % (ref 0.0–0.2)

## 2020-05-27 LAB — PHOSPHORUS: Phosphorus: 4.1 mg/dL (ref 2.5–4.6)

## 2020-05-27 LAB — COOXEMETRY PANEL
Carboxyhemoglobin: 0.8 % (ref 0.5–1.5)
Methemoglobin: 0.9 % (ref 0.0–1.5)
O2 Saturation: 83.8 %
Total hemoglobin: 13.1 g/dL (ref 12.0–16.0)

## 2020-05-27 LAB — MAGNESIUM: Magnesium: 2.2 mg/dL (ref 1.7–2.4)

## 2020-05-27 MED ORDER — OXYCODONE HCL 5 MG PO TABS
5.0000 mg | ORAL_TABLET | Freq: Four times a day (QID) | ORAL | Status: DC
  Administered 2020-05-27 – 2020-05-28 (×4): 5 mg
  Filled 2020-05-27 (×4): qty 1

## 2020-05-27 MED ORDER — METHYLPREDNISOLONE SODIUM SUCC 40 MG IJ SOLR
40.0000 mg | Freq: Every day | INTRAMUSCULAR | Status: DC
  Administered 2020-05-28 – 2020-05-30 (×3): 40 mg via INTRAVENOUS
  Filled 2020-05-27 (×3): qty 1

## 2020-05-27 MED ORDER — CLONAZEPAM 0.5 MG PO TABS
1.0000 mg | ORAL_TABLET | Freq: Two times a day (BID) | ORAL | Status: DC
  Administered 2020-05-27 – 2020-05-28 (×3): 1 mg
  Filled 2020-05-27 (×3): qty 2

## 2020-05-27 NOTE — Progress Notes (Signed)
MD notified:  Pt's girlfriend, Cala Bradford, called with the fax number for MD to send a note to his employer - said was previously disscussed.  [employer] is employer; Fax# is [phone], Attn: HR. Will need obviously name, date of admission, currently hospitalized - at the minimum.    Fax was sent successfully per [employer] operational officer

## 2020-05-27 NOTE — Progress Notes (Signed)
PCCM Interval Progress Note  Called significant other Cala Bradford and provided an update on [place].   Rutherford Guys, [place] [facility] 05/27/2020, 3:19 PM

## 2020-05-27 NOTE — Progress Notes (Signed)
Patient placed in supine position. Patient's ETT secured in proper position with cloth tape.

## 2020-05-27 NOTE — Progress Notes (Signed)
RT obtained ABG and reported results to [facility]. ABG as follows: p H7.32, CO2 85.9, PO2 81 and HCO3 44.8. RT decreased iNO from 20ppm to 15ppm per MD. RT will obtain ABG at 3pm per MD and continue to monitor.

## 2020-05-27 NOTE — Progress Notes (Signed)
Patient placed in supine position.  ETT secured.  No breakdown noted.  Patient tolerated well with no complications. 

## 2020-05-27 NOTE — Progress Notes (Signed)
eLink Physician-Brief Progress Note Patient Name: Brian Mack DOB: 02/23/1986 MRN: [id number]   Date of Service  05/27/2020  HPI/Events of Note  P/F ratio = 119/0.8 = 148.75. P/F ration < 150 In and the patient remains on Nitric Oxide.   eICU Interventions  Would continue to prone the patient tonight.      Intervention Category Major Interventions: Respiratory failure - evaluation and management  Garet Hooton [place] 05/27/2020, 1:36 AM

## 2020-05-27 NOTE — Progress Notes (Signed)
RTx2 and RNx3 turned patient supine @1750  without complication. RT will continue to monitor.

## 2020-05-27 NOTE — Progress Notes (Signed)
NAME:  Brian Mack, MRN:  [id number], DOB:  1986/08/22, LOS: 12 ADMISSION DATE:  06/01/2020, CONSULTATION DATE:  06/01/2020 REFERRING MD:  Ophelia Charter, CHIEF COMPLAINT:  SOB   Brief History   34 year old unvaccinated man with no PMH presenting with severe COVID pneumonia.  History of present illness   34 yo M with hx obesity admitted 9/2 with worsening SOB after testing + for COVID-19 8/26. PCCM consulted 9/3 for SOB and agreed with current treatment of steroids, remdesivir, barcitinib, IS, supplemental O2. Reconsulted 9/9 due to hypoxemia. Transferred to ICU   Past Medical History  None  Significant Hospital Events   9/2 admitted 9/9 PCCM reconsulted  Consults:  N/A  Procedures:  ETT 9/11 CVC 9/11 A-line 9/11  Significant Diagnostic Tests:    Micro Data:  COVID + Blood cx 9/11> resp cx 9/11> normal flora   Antimicrobials:  Ceftriaxone 9/2>> 9/6 Azithromycin 9/2>>  9/6 Cefepime 9/11> vanc 9/11> eraxis 9/11>9/13  Interim history/subjective:  Remains on NMB and iNO. Pplat and Pdrive improved (27-28 and 14 range).  Objective   Blood pressure 130/66, pulse 81, temperature 99.3 F (37.4 C), temperature source Axillary, resp. rate (!) 35, height 5\' 9"  (1.753 m), weight 108.4 kg, SpO2 96 %.    Vent Mode: PRVC FiO2 (%):  [70 %-80 %] 70 % Set Rate:  [35 bmp] 35 bmp Vt Set:  [330 mL-370 mL] 370 mL PEEP:  [14 cmH20-16 cmH20] 14 cmH20 Plateau Pressure:  [26 cmH20-31 cmH20] 29 cmH20   Intake/Output Summary (Last 24 hours) at 05/27/2020 1104 Last data filed at 05/27/2020 0700 Gross per 24 hour  Intake 3630.52 ml  Output 3375 ml  Net 255.52 ml   Filed Weights   05/24/20 1845 05/26/20 0500 05/27/20 0500  Weight: 109.1 kg 108.2 kg 108.4 kg    Examination: Gen:   Critically ill-appearing young man lying in bed intubated, sedated, in prone position HEENT: [place]/AT, scleral edema with pinpoint pupils bilaterally.  Clear nasal drainage.  Endotracheal tube in place. Lungs:  Equal breath sounds bilaterally, vent assisted CV:         Regular rate and rhythm Abd:      Limited exam due to positioning, soft Ext: No significant edema, no clubbing or cyanosis.  Left radial A-line with good distal perfusion. Skin:      Warm and dry Neuro: RASS -5, under NMB  Assessment & Plan:   COVID-19 pneumonia, severe ARDS.  S/p course of remdesivir LLL HAP possible -Continue steroids, baricitinib -Continue to follow respiratory cultures and blood cultures. Con't empiric cefepime and Vanco in meantime and discontinue Eraxis. -Continue low tidal line ventilation with goal plateau 30 driving pressure less than 15.  Now meeting goals with low tidal volume.  Continue permissive hypercapnia with goal PCO2 85 or less and pH greater than 7.1.   -Continue inhaled nitric oxide, start weaning gradually now that pressures improved -VAP prevention protocol -Continue proning per protocol-prone if P:F<150 for 16 hrs. -Fentanyl and Versed for sedation.  Continue neuromuscular blockade continuous infusion. - Add klonopin and oxy IR scheduled per tube to try and decrease drip requirements -Lasix with goal to maintain euvolemia   Leukocytosis, concerning for acute bacterial pneumonia.  Some component reactive -Con't empiric antibiotics and follow cultures -Okay to continue baricitinib -Continue steroids  Hyperglycemia-controlled -Accu-Cheks every 4 hours with sliding scale insulin -Goal BG 140-180 while admitted to the ICU  AKI, stable Ureteral colic in setting of mid R ureter stone -- improved  -Holding  Flomax while intubated -May need repeat renal ultrasound in a few days to ensure no hydronephrosis behind stone -Foley catheter -Continue to monitor -Renally dose meds and avoid nephrotoxic meds -Strict I/Os, especially with diuresis.  Goal for euvolemia.  GoC -con't all aggressive care measures  Best practice:  Diet: TF's Pain/Anxiety/Delirium protocol (if indicated): Fentanyl  gtt / Midazolam gtt / Vecuronium PRN.  RASS goal -5 VAP protocol (if indicated): In place DVT prophylaxis: lovenox GI prophylaxis: PPI Glucose control: SSI Mobility: Bedrest Code Status: full Family Communication: Will call s/o Cala Bradford this PM Disposition: ICU   CC time: 40 min.   Rutherford Guys, [place] [facility] 05/27/2020, 11:12 AM

## 2020-05-27 NOTE — Progress Notes (Signed)
Patient in prone position. Head rotated from right side down to left side down.

## 2020-05-28 DIAGNOSIS — Z9911 Dependence on respirator [ventilator] status: Secondary | ICD-10-CM

## 2020-05-28 LAB — BLOOD GAS, ARTERIAL
Acid-Base Excess: 16.9 mmol/L — ABNORMAL HIGH (ref 0.0–2.0)
Bicarbonate: 42.8 mmol/L — ABNORMAL HIGH (ref 20.0–28.0)
FIO2: 70
O2 Saturation: 92.2 %
Patient temperature: 37
pCO2 arterial: 71.9 mmHg (ref 32.0–48.0)
pH, Arterial: 7.392 (ref 7.350–7.450)
pO2, Arterial: 62 mmHg — ABNORMAL LOW (ref 83.0–108.0)

## 2020-05-28 LAB — MAGNESIUM: Magnesium: 2.3 mg/dL (ref 1.7–2.4)

## 2020-05-28 LAB — POCT I-STAT 7, (LYTES, BLD GAS, ICA,H+H)
Acid-Base Excess: 20 mmol/L — ABNORMAL HIGH (ref 0.0–2.0)
Bicarbonate: 48.9 mmol/L — ABNORMAL HIGH (ref 20.0–28.0)
Calcium, Ion: 1.12 mmol/L — ABNORMAL LOW (ref 1.15–1.40)
HCT: 43 % (ref 39.0–52.0)
Hemoglobin: 14.6 g/dL (ref 13.0–17.0)
O2 Saturation: 87 %
Patient temperature: 97.8
Potassium: 4.6 mmol/L (ref 3.5–5.1)
Sodium: 139 mmol/L (ref 135–145)
TCO2: >50 mmol/L — ABNORMAL HIGH (ref 22–32)
pCO2 arterial: 71.3 mmHg (ref 32.0–48.0)
pH, Arterial: 7.442 (ref 7.350–7.450)
pO2, Arterial: 53 mmHg — ABNORMAL LOW (ref 83.0–108.0)

## 2020-05-28 LAB — GLUCOSE, CAPILLARY
Glucose-Capillary: 126 mg/dL — ABNORMAL HIGH (ref 70–99)
Glucose-Capillary: 118 mg/dL — ABNORMAL HIGH (ref 70–99)
Glucose-Capillary: 109 mg/dL — ABNORMAL HIGH (ref 70–99)
Glucose-Capillary: 233 mg/dL — ABNORMAL HIGH (ref 70–99)
Glucose-Capillary: 203 mg/dL — ABNORMAL HIGH (ref 70–99)
Glucose-Capillary: 163 mg/dL — ABNORMAL HIGH (ref 70–99)

## 2020-05-28 LAB — BASIC METABOLIC PANEL
Anion gap: 8 (ref 5–15)
BUN: 25 mg/dL — ABNORMAL HIGH (ref 6–20)
CO2: 38 mmol/L — ABNORMAL HIGH (ref 22–32)
Calcium: 8.3 mg/dL — ABNORMAL LOW (ref 8.9–10.3)
Chloride: 92 mmol/L — ABNORMAL LOW (ref 98–111)
Creatinine, Ser: 0.83 mg/dL (ref 0.61–1.24)
GFR calc Af Amer: >60 mL/min (ref >60)
GFR calc non Af Amer: >60 mL/min (ref >60)
Glucose, Bld: 169 mg/dL — ABNORMAL HIGH (ref 70–99)
Potassium: 4.3 mmol/L (ref 3.5–5.1)
Sodium: 138 mmol/L (ref 135–145)

## 2020-05-28 LAB — PHOSPHORUS: Phosphorus: 3.4 mg/dL (ref 2.5–4.6)

## 2020-05-28 MED ORDER — AZELASTINE HCL 0.1 % NA SOLN
2.0000 | Freq: Two times a day (BID) | NASAL | Status: DC
  Administered 2020-05-28 – 2020-05-30 (×4): 2 via NASAL
  Filled 2020-05-28: qty 30

## 2020-05-28 MED ORDER — OXYCODONE HCL 5 MG PO TABS
10.0000 mg | ORAL_TABLET | Freq: Four times a day (QID) | ORAL | Status: AC
  Administered 2020-05-28 – 2020-06-10 (×49): 10 mg
  Filled 2020-05-28 (×49): qty 2

## 2020-05-28 MED ORDER — CLONAZEPAM 1 MG PO TABS
2.0000 mg | ORAL_TABLET | Freq: Two times a day (BID) | ORAL | Status: DC
  Administered 2020-05-28 – 2020-06-08 (×20): 2 mg
  Filled 2020-05-28 (×20): qty 2

## 2020-05-28 MED ORDER — DEXMEDETOMIDINE HCL IN NACL 400 MCG/100ML IV SOLN
0.4000 ug/kg/h | INTRAVENOUS | Status: DC
  Administered 2020-05-28 (×2): 1 ug/kg/h via INTRAVENOUS
  Administered 2020-05-28: 0.8 ug/kg/h via INTRAVENOUS
  Administered 2020-05-29 – 2020-05-30 (×3): 0.6 ug/kg/h via INTRAVENOUS
  Administered 2020-05-30: 0.4 ug/kg/h via INTRAVENOUS
  Administered 2020-05-30: 0.801 ug/kg/h via INTRAVENOUS
  Administered 2020-05-30: 0.8 ug/kg/h via INTRAVENOUS
  Administered 2020-05-31: 0.7 ug/kg/h via INTRAVENOUS
  Filled 2020-05-28 (×4): qty 100
  Filled 2020-05-28: qty 200
  Filled 2020-05-28: qty 100
  Filled 2020-05-28: qty 200

## 2020-05-28 MED ORDER — FUROSEMIDE 10 MG/ML IJ SOLN
40.0000 mg | Freq: Two times a day (BID) | INTRAMUSCULAR | Status: DC
  Administered 2020-05-28 – 2020-05-30 (×5): 40 mg via INTRAVENOUS
  Filled 2020-05-28 (×5): qty 4

## 2020-05-28 MED ORDER — FREE WATER
100.0000 mL | Status: DC
  Administered 2020-05-28 – 2020-06-02 (×24): 100 mL

## 2020-05-28 NOTE — Progress Notes (Signed)
Patient placed in supine position at this time without complications. ETT still secured in proper position. Will obtain ABG shortly to determine if patient needs to be proned again.

## 2020-05-28 NOTE — Progress Notes (Signed)
Patient head & arms rotated. Now lying right side face down.

## 2020-05-28 NOTE — Progress Notes (Signed)
Notified Clark, DO that pt. Does not have any twitches on TOF. MD aware and will continue to monitor patient at this time.

## 2020-05-28 NOTE — Progress Notes (Signed)
Patient head & arms repositioned. Lying left side face down now.

## 2020-05-28 NOTE — Progress Notes (Signed)
Notified Clark, DO that pt. Keeps having hypertensive episodes. RN boluses pt. And pt. Does not respond. MD added on a precedex drip and is ok if BIS number is high. RN will continue to monitor pt. At this time and titrate drips accordingly.

## 2020-05-28 NOTE — Progress Notes (Signed)
Patient placed in prone position at this time without complications. ETT secured with cloth tape in proper position.

## 2020-05-28 NOTE — Progress Notes (Signed)
NAME:  Brian Mack, MRN:  [id number], DOB:  05/26/1986, LOS: 13 ADMISSION DATE:  06/09/2020, CONSULTATION DATE:  06/12/2020 REFERRING MD:  Ophelia Charter, CHIEF COMPLAINT:  SOB   Brief History   34 year old unvaccinated man with no PMH presenting with severe COVID pneumonia.  History of present illness   34 yo M with hx obesity admitted 9/2 with worsening SOB after testing + for COVID-19 8/26. PCCM consulted 9/3 for SOB and agreed with current treatment of steroids, remdesivir, barcitinib, IS, supplemental O2. Reconsulted 9/9 due to hypoxemia. Transferred to ICU   Past Medical History  None  Significant Hospital Events   9/2 admitted 9/9 PCCM reconsulted  Consults:  N/A  Procedures:  ETT 9/11 CVC 9/11 A-line 9/11  Significant Diagnostic Tests:    Micro Data:  COVID + Blood cx 9/11> resp cx 9/11> normal flora   Antimicrobials:  Ceftriaxone 9/2>> 9/6 Azithromycin 9/2>>  9/6 Cefepime 9/11> vanc 9/11> eraxis 9/11>9/13  Interim history/subjective:  Remains on NMB and iNO. Pplat and Pdrive improved (27-28 and 14 range).  Objective   Blood pressure 118/63, pulse 86, temperature 97.8 F (36.6 C), temperature source Oral, resp. rate (!) 35, height 5\' 9"  (1.753 m), weight 108.4 kg, SpO2 96 %.    Vent Mode: PRVC FiO2 (%):  [70 %] 70 % Set Rate:  [35 bmp] 35 bmp Vt Set:  [370 mL] 370 mL PEEP:  [12 cmH20-14 cmH20] 12 cmH20 Plateau Pressure:  [26 cmH20-30 cmH20] 26 cmH20   Intake/Output Summary (Last 24 hours) at 05/28/2020 0920 Last data filed at 05/28/2020 0800 Gross per 24 hour  Intake 2644.1 ml  Output 925 ml  Net 1719.1 ml   Filed Weights   05/26/20 0500 05/27/20 0500 05/28/20 0500  Weight: 108.2 kg 108.4 kg 108.4 kg    Examination: Gen:   Critically ill appearing man laying in bed proned, NMB, on MV HEENT: [place]/AT, eyes anicteric. Yellow sinus drainage from nose in prone position. OGT, ETT.  Lungs: rhales bilaterally CV:         RRR Abd:      Soft,  ND Ext: Minimal edema. No cyanosis or clubbing. Skin:    Warm & dry Neuro: RASS -5, under NMB  Vent 35/390/10/70% proned, Pplat 28, driving pressure 10-18-1973, iNO 10->5  Resolved problems:  AKI  Assessment & Plan:   COVID-19 pneumonia, severe ARDS.   -- completed course of remdesivir --Decrease steroids to solumedrol 40 mg daily, con't baricitinib -Continue to follow respiratory cultures and blood cultures.  Off antibiotics now.  -Con't LTVV, meeting Pplat and DP goals. Permissive hypercapnia with goal PCO2 <85 and pH >7.1. -decrease iNO to 5ppm; repeat ABH in 4 hours. Goal to discontinue this today. -- after off iNO will transition to PRN NMB hopefully -VAP prevention protocol -Continue proning per protocol-prone daily for 16 hours if P:F<150. -Fentanyl and Versed for sedation.    - increase enteral klonopin and oxy IR scheduled per tube to try and decrease drip requirements -Lasix with goal to maintain euvolemia-- 40mg  IV BID   Leukocytosis, concerning for acute bacterial pneumonia.  Some component reactive -Con't to follow cultures; antibiotics previously disctontinued. -Okay to continue baricitinib -decreasing steroids  Hyperglycemia-controlled -Accu-Cheks every 4 hours with sliding scale insulin -Goal BG 140-180 while admitted to the ICU  Allergic sinusitis -start azelastine b/l BID -previously d/c afrin   AKI, resolved Ureteral colic in setting of mid R ureter stone -- improved  -Holding Flomax while intubated -Foley catheter -Continue to  monitor -Renally dose meds and avoid nephrotoxic meds -Strict I/Os, especially with diuresis.  Goal for euvolemia. Monitor electrolytes and replete PRN.  GoC -con't all aggressive care measures  Best practice:  Diet: TF's Pain/Anxiety/Delirium protocol (if indicated): Fentanyl gtt / Midazolam gtt / Vecuronium PRN.  RASS goal -5 VAP protocol (if indicated): In place DVT prophylaxis: lovenox GI prophylaxis: PPI Glucose control:  SSI Mobility: Bedrest Code Status: full Family Communication: Will update s/o Kimberly Disposition: ICU   This patient is critically ill with multiple organ system failure which requires frequent high complexity decision making, assessment, support, evaluation, and titration of therapies. This was completed through the application of advanced monitoring technologies and extensive interpretation of multiple databases. During this encounter critical care time was devoted to patient care services described in this note for 32 minutes.   Steffanie Dunn, DO 05/28/20 9:38 AM [facility]

## 2020-05-28 NOTE — Progress Notes (Signed)
Patient's ETT resecured in proper position with cloth tape due to tape having drainage on it. Checked skin for breakdowns and placed Mepilex on patient before taping tube.

## 2020-05-29 ENCOUNTER — Inpatient Hospital Stay (HOSPITAL_COMMUNITY): Payer: HRSA Program

## 2020-05-29 LAB — POCT I-STAT 7, (LYTES, BLD GAS, ICA,H+H)
Acid-Base Excess: 19 mmol/L — ABNORMAL HIGH (ref 0.0–2.0)
Bicarbonate: 49.4 mmol/L — ABNORMAL HIGH (ref 20.0–28.0)
Calcium, Ion: 1.12 mmol/L — ABNORMAL LOW (ref 1.15–1.40)
HCT: 42 % (ref 39.0–52.0)
Hemoglobin: 14.3 g/dL (ref 13.0–17.0)
O2 Saturation: 95 %
Patient temperature: 98.8
Potassium: 5.7 mmol/L — ABNORMAL HIGH (ref 3.5–5.1)
Sodium: 141 mmol/L (ref 135–145)
TCO2: >50 mmol/L — ABNORMAL HIGH (ref 22–32)
pCO2 arterial: 85.3 mmHg (ref 32.0–48.0)
pH, Arterial: 7.371 (ref 7.350–7.450)
pO2, Arterial: 83 mmHg (ref 83.0–108.0)

## 2020-05-29 LAB — CBC
HCT: 45.2 % (ref 39.0–52.0)
Hemoglobin: 13.7 g/dL (ref 13.0–17.0)
MCH: 27.2 pg (ref 26.0–34.0)
MCHC: 30.3 g/dL (ref 30.0–36.0)
MCV: 89.7 fL (ref 80.0–100.0)
Platelets: 260 10*3/uL (ref 150–400)
RBC: 5.04 MIL/uL (ref 4.22–5.81)
RDW: 12.8 % (ref 11.5–15.5)
WBC: 18.7 10*3/uL — ABNORMAL HIGH (ref 4.0–10.5)
nRBC: 0 % (ref 0.0–0.2)

## 2020-05-29 LAB — CULTURE, BLOOD (ROUTINE X 2)
Culture: NO GROWTH
Culture: NO GROWTH
Special Requests: ADEQUATE
Special Requests: ADEQUATE

## 2020-05-29 LAB — BASIC METABOLIC PANEL
Anion gap: 8 (ref 5–15)
BUN: 31 mg/dL — ABNORMAL HIGH (ref 6–20)
CO2: 39 mmol/L — ABNORMAL HIGH (ref 22–32)
Calcium: 8.2 mg/dL — ABNORMAL LOW (ref 8.9–10.3)
Chloride: 96 mmol/L — ABNORMAL LOW (ref 98–111)
Creatinine, Ser: 0.8 mg/dL (ref 0.61–1.24)
GFR calc Af Amer: >60 mL/min (ref >60)
GFR calc non Af Amer: >60 mL/min (ref >60)
Glucose, Bld: 185 mg/dL — ABNORMAL HIGH (ref 70–99)
Potassium: 4.1 mmol/L (ref 3.5–5.1)
Sodium: 143 mmol/L (ref 135–145)

## 2020-05-29 LAB — GLUCOSE, CAPILLARY
Glucose-Capillary: 168 mg/dL — ABNORMAL HIGH (ref 70–99)
Glucose-Capillary: 119 mg/dL — ABNORMAL HIGH (ref 70–99)
Glucose-Capillary: 120 mg/dL — ABNORMAL HIGH (ref 70–99)
Glucose-Capillary: 179 mg/dL — ABNORMAL HIGH (ref 70–99)
Glucose-Capillary: 130 mg/dL — ABNORMAL HIGH (ref 70–99)
Glucose-Capillary: 102 mg/dL — ABNORMAL HIGH (ref 70–99)

## 2020-05-29 MED ORDER — PANTOPRAZOLE SODIUM 40 MG PO TBEC
40.0000 mg | DELAYED_RELEASE_TABLET | Freq: Every day | ORAL | Status: DC
  Filled 2020-05-29: qty 1

## 2020-05-29 NOTE — Progress Notes (Addendum)
RT note. Pt. Placed supine,pt. Tolerated well, no complications noted. holter placed on pt.  Pt. Resting comfortable and VS within normal range. ABG obtained at 2140

## 2020-05-29 NOTE — Progress Notes (Signed)
NAME:  Brian Mack, MRN:  [id number], DOB:  Aug 03, 1986, LOS: 14 ADMISSION DATE:  2020/05/20, CONSULTATION DATE:  9/2 REFERRING MD:  Ophelia Charter, CHIEF COMPLAINT:  Dyspnea   Brief History   34 y/o male admitted on 9/2 with COVID pneumonia after testing positive on 8/26.  PCCM consulted for admission on 9/3, treated by [facility] on progressive care, then condition worsened by 9/9 and he was eventually intubated on 9/11.   Past Medical History  Obesity  Significant Hospital Events     Consults:    Procedures:  ETT 9/11 CVC 9/11 A-line 9/11  Significant Diagnostic Tests:  9/5 CT abd/pelvis > mild right hydrouteronephrosis with stone in mid ureter, diffuse pulmonary parenchymal findings consistent with pneumonia, suspected gallstone without acute cholecystitis, prior chronic inflammatory changes in distal ileum and ascending colon.  9/7 Echo> LVEF 60-65%, RV size, function normal, valves OK 9/11 Echo> LVEF 70-75%, RV size, function is normal, valves OK  Micro Data:  8/26 SARS COV 2 > positive 9/2 blood >  9/11 resp >  9/11 blood >   Antimicrobials:  Ceftriaxone 9/2>> 9/6 Azithromycin 9/2>>  9/6 Cefepime 9/11>9/14 vanc 9/11>9/14 Eraxis 9/11>9/13   Interim history/subjective:  Remains off of NO since yesterday OG dislodged  Objective   Blood pressure 116/65, pulse 71, temperature 97.8 F (36.6 C), temperature source Axillary, resp. rate (!) 22, height 5\' 9"  (1.753 m), weight 108.3 kg, SpO2 91 %.    Vent Mode: PRVC FiO2 (%):  [70 %-80 %] 80 % Set Rate:  [35 bmp] 35 bmp Vt Set:  [390 mL] 390 mL PEEP:  [10 cmH20-12 cmH20] 12 cmH20 Plateau Pressure:  [25 cmH20-33 cmH20] 33 cmH20   Intake/Output Summary (Last 24 hours) at 05/29/2020 0811 Last data filed at 05/29/2020 0800 Gross per 24 hour  Intake 2763.86 ml  Output 6090 ml  Net -3326.14 ml   Filed Weights   05/27/20 0500 05/28/20 0500 05/29/20 0500  Weight: 108.4 kg 108.4 kg 108.3 kg    Examination:  General:  In  bed on vent, prone HENT: NCAT ETT in place PULM: Crackles bases, vent supported breathing CV: difficult to examine due to prone GI: prone, difficult to examine MSK: normal bulk and tone Neuro: sedated on vent    Resolved Hospital Problem list     Assessment & Plan:  COVID 19, severe ARDS Continue mechanical ventilation per ARDS protocol Target TVol 6-8cc/kgIBW Target Plateau Pressure < 30cm H20 Target driving pressure less than 15 cm of water Target PaO2 55-65: titrate PEEP/FiO2 per protocol As long as PaO2 to FiO2 ratio is less than 1:150 position in prone position for 16 hours a day Check CVP daily if CVL in place Target CVP less than 4, diurese as necessary Ventilator associated pneumonia prevention protocol 9/16 plan > increase PEEP to drive down driving pressure, supine today check ABG after, maintain solumedrol, baricitinib, continue lasix, will likley need prone again  Need for sedation for mechanical ventilation NMB protocol RASS -5 Fentanyl, versed, precedex infusion  HCAP> resolved Monitor off antibiotics  Hyperglycemia SSI  AKI resolved Ureteral colic in setting of mid R ureter stone Monitor BMET and UOP Replace electrolytes as needed   Best practice:  Diet: tube feeding (replace cor track on Friday) Pain/Anxiety/Delirium protocol (if indicated): as above VAP protocol (if indicated): yes DVT prophylaxis: lovenox GI prophylaxis: Pantoprazole for stress ulcer prophylaxis Glucose control: SSI Mobility: bed rest Code Status: full Family Communication: updated girlfriend 9/16 by phone Disposition: remain in ICU  Labs  CBC: Recent Labs  Lab 05/23/20 0805 05/23/20 0805 05/24/20 0237 05/24/20 0503 05/25/20 0341 05/25/20 1710 05/27/20 0507 05/27/20 0507 05/27/20 1112 05/27/20 1613 05/27/20 2040 05/28/20 2151 05/29/20 0445  WBC 30.0*  --  28.6*  --  33.4*  --  19.7*  --   --   --   --   --  18.7*  HGB 15.7   < > 17.9*   < > 14.9   < > 12.9*    < > 13.6 12.9* 12.9* 14.6 13.7  HCT 47.8   < > 55.3*   < > 49.3   < > 44.0   < > 40.0 38.0* 38.0* 43.0 45.2  MCV 83.1  --  83.9  --  90.5  --  92.1  --   --   --   --   --  89.7  PLT 434*  --  471*  --  390  --  304  --   --   --   --   --  260   < > = values in this interval not displayed.    Basic Metabolic Panel: Recent Labs  Lab 05/25/20 0341 05/25/20 1710 05/26/20 0206 05/26/20 0317 05/26/20 1216 05/27/20 0057 05/27/20 0507 05/27/20 1112 05/27/20 1613 05/27/20 2040 05/28/20 0413 05/28/20 2151 05/29/20 0445  NA 141   < >   < >  --  141   < > 139   < > 136 136 138 139 143  K 5.1   < >   < >  --  5.0   < > 4.8   < > 4.6 4.3 4.3 4.6 4.1  CL 95*  --   --   --  94*  --  92*  --   --   --  92*  --  96*  CO2 39*  --   --   --  39*  --  40*  --   --   --  38*  --  39*  GLUCOSE 233*  --   --   --  175*  --  186*  --   --   --  169*  --  185*  BUN 27*  --   --   --  26*  --  25*  --   --   --  25*  --  31*  CREATININE 1.20  --   --   --  0.92  --  0.91  --   --   --  0.83  --  0.80  CALCIUM 7.5*  --   --   --  8.1*  --  8.1*  --   --   --  8.3*  --  8.2*  MG 2.6*  --   --  2.4  --   --  2.2  --   --   --  2.3  --   --   PHOS 5.8*  --   --  3.5  --   --  4.1  --   --   --  3.4  --   --    < > = values in this interval not displayed.   GFR: Estimated Creatinine Clearance: 159.2 mL/min (by C-G formula based on SCr of 0.8 mg/dL). Recent Labs  Lab 05/23/20 0805 05/23/20 0805 05/24/20 0237 05/25/20 0341 05/27/20 0507 05/29/20 0445  PROCALCITON <0.10  --   --   --   --   --   WBC  30.0*   < > 28.6* 33.4* 19.7* 18.7*   < > = values in this interval not displayed.    Liver Function Tests: Recent Labs  Lab 05/23/20 0805 05/24/20 0237 05/25/20 0341  AST 27 33 43*  ALT 40 40 67*  ALKPHOS 65 93 94  BILITOT 0.9 1.0 0.7  PROT 6.3* 7.7 5.9*  ALBUMIN 2.8* 3.3* 2.5*   No results for input(s): LIPASE, AMYLASE in the last 168 hours. No results for input(s): AMMONIA in the last  168 hours.  ABG    Component Value Date/Time   PHART 7.442 05/28/2020 2151   PCO2ART 71.3 (HH) 05/28/2020 2151   PO2ART 53 (L) 05/28/2020 2151   HCO3 48.9 (H) 05/28/2020 2151   TCO2 >50 (H) 05/28/2020 2151   O2SAT 87.0 05/28/2020 2151     Coagulation Profile: No results for input(s): INR, PROTIME in the last 168 hours.  Cardiac Enzymes: No results for input(s): CKTOTAL, CKMB, CKMBINDEX, TROPONINI in the last 168 hours.  HbA1C: Hgb A1c MFr Bld  Date/Time Value Ref Range Status  05/25/2020 10:24 AM 6.4 (H) 4.8 - 5.6 % Final    Comment:    (NOTE) Pre diabetes:          5.7%-6.4%  Diabetes:              >6.4%  Glycemic control for   <7.0% adults with diabetes     CBG: Recent Labs  Lab 05/28/20 1527 05/28/20 1950 05/28/20 2319 05/29/20 0326 05/29/20 0729  GLUCAP 233* 203* 163* 168* 119*       Critical care time: 35 minutes    Heber Hardeman, MD [place] [facility] Pager: [phone] Cell: [phone] If no response, call [phone]

## 2020-05-29 NOTE — Progress Notes (Signed)
Patient placed in prone position at this time without complications.

## 2020-05-29 NOTE — Progress Notes (Signed)
Upon assessment OGT was not correctly placed in pt. KUB was ordered to confirm placement and RN got notified it needed to be advanced. RN advanced OGT, another KUB was ordered to confirm placement. Come to find out the end of the tube was clotted. Another OGT was placed but unsuccessful.   Dr. Kendrick Fries, MD was made aware and ordered for cortrak to be placed tomorrow. RN will continue to monitor pt. At this time and hold all OGT medications.

## 2020-05-30 ENCOUNTER — Inpatient Hospital Stay (HOSPITAL_COMMUNITY): Payer: HRSA Program

## 2020-05-30 LAB — CBC WITH DIFFERENTIAL/PLATELET
Abs Immature Granulocytes: 0.2 10*3/uL — ABNORMAL HIGH (ref 0.00–0.07)
Basophils Absolute: 0 10*3/uL (ref 0.0–0.1)
Basophils Relative: 0 %
Eosinophils Absolute: 0 10*3/uL (ref 0.0–0.5)
Eosinophils Relative: 0 %
HCT: 45.1 % (ref 39.0–52.0)
Hemoglobin: 13.3 g/dL (ref 13.0–17.0)
Immature Granulocytes: 1 %
Lymphocytes Relative: 9 %
Lymphs Abs: 1.4 10*3/uL (ref 0.7–4.0)
MCH: 27.3 pg (ref 26.0–34.0)
MCHC: 29.5 g/dL — ABNORMAL LOW (ref 30.0–36.0)
MCV: 92.6 fL (ref 80.0–100.0)
Monocytes Absolute: 1.1 10*3/uL — ABNORMAL HIGH (ref 0.1–1.0)
Monocytes Relative: 7 %
Neutro Abs: 13.5 10*3/uL — ABNORMAL HIGH (ref 1.7–7.7)
Neutrophils Relative %: 83 %
Platelets: 211 10*3/uL (ref 150–400)
RBC: 4.87 MIL/uL (ref 4.22–5.81)
RDW: 13 % (ref 11.5–15.5)
WBC: 16.3 10*3/uL — ABNORMAL HIGH (ref 4.0–10.5)
nRBC: 0 % (ref 0.0–0.2)

## 2020-05-30 LAB — POCT I-STAT 7, (LYTES, BLD GAS, ICA,H+H)
Acid-Base Excess: 22 mmol/L — ABNORMAL HIGH (ref 0.0–2.0)
Acid-Base Excess: 19 mmol/L — ABNORMAL HIGH (ref 0.0–2.0)
Bicarbonate: 51 mmol/L — ABNORMAL HIGH (ref 20.0–28.0)
Bicarbonate: 50.7 mmol/L — ABNORMAL HIGH (ref 20.0–28.0)
Calcium, Ion: 1.17 mmol/L (ref 1.15–1.40)
Calcium, Ion: 1.14 mmol/L — ABNORMAL LOW (ref 1.15–1.40)
HCT: 39 % (ref 39.0–52.0)
HCT: 45 % (ref 39.0–52.0)
Hemoglobin: 13.3 g/dL (ref 13.0–17.0)
Hemoglobin: 15.3 g/dL (ref 13.0–17.0)
O2 Saturation: 94 %
O2 Saturation: 84 %
Patient temperature: 93.7
Patient temperature: 97.7
Potassium: 4.4 mmol/L (ref 3.5–5.1)
Potassium: 4.8 mmol/L (ref 3.5–5.1)
Sodium: 142 mmol/L (ref 135–145)
Sodium: 142 mmol/L (ref 135–145)
TCO2: >50 mmol/L — ABNORMAL HIGH (ref 22–32)
TCO2: >50 mmol/L — ABNORMAL HIGH (ref 22–32)
pCO2 arterial: 71.2 mmHg (ref 32.0–48.0)
pCO2 arterial: 94.6 mmHg (ref 32.0–48.0)
pH, Arterial: 7.452 — ABNORMAL HIGH (ref 7.350–7.450)
pH, Arterial: 7.335 — ABNORMAL LOW (ref 7.350–7.450)
pO2, Arterial: 63 mmHg — ABNORMAL LOW (ref 83.0–108.0)
pO2, Arterial: 55 mmHg — ABNORMAL LOW (ref 83.0–108.0)

## 2020-05-30 LAB — COMPREHENSIVE METABOLIC PANEL
ALT: 54 U/L — ABNORMAL HIGH (ref 0–44)
AST: 25 U/L (ref 15–41)
Albumin: 2.5 g/dL — ABNORMAL LOW (ref 3.5–5.0)
Alkaline Phosphatase: 64 U/L (ref 38–126)
Anion gap: 8 (ref 5–15)
BUN: 31 mg/dL — ABNORMAL HIGH (ref 6–20)
CO2: 39 mmol/L — ABNORMAL HIGH (ref 22–32)
Calcium: 8.2 mg/dL — ABNORMAL LOW (ref 8.9–10.3)
Chloride: 94 mmol/L — ABNORMAL LOW (ref 98–111)
Creatinine, Ser: 0.87 mg/dL (ref 0.61–1.24)
GFR calc Af Amer: >60 mL/min (ref >60)
GFR calc non Af Amer: >60 mL/min (ref >60)
Glucose, Bld: 103 mg/dL — ABNORMAL HIGH (ref 70–99)
Potassium: 4.9 mmol/L (ref 3.5–5.1)
Sodium: 141 mmol/L (ref 135–145)
Total Bilirubin: 0.4 mg/dL (ref 0.3–1.2)
Total Protein: 5.3 g/dL — ABNORMAL LOW (ref 6.5–8.1)

## 2020-05-30 LAB — GLUCOSE, CAPILLARY
Glucose-Capillary: 103 mg/dL — ABNORMAL HIGH (ref 70–99)
Glucose-Capillary: 104 mg/dL — ABNORMAL HIGH (ref 70–99)
Glucose-Capillary: 141 mg/dL — ABNORMAL HIGH (ref 70–99)
Glucose-Capillary: 176 mg/dL — ABNORMAL HIGH (ref 70–99)
Glucose-Capillary: 155 mg/dL — ABNORMAL HIGH (ref 70–99)
Glucose-Capillary: 117 mg/dL — ABNORMAL HIGH (ref 70–99)

## 2020-05-30 MED ORDER — EPINEPHRINE 1 MG/10ML IJ SOSY: 1.0000 | PREFILLED_SYRINGE | Freq: Once | INTRAMUSCULAR | Status: AC

## 2020-05-30 MED ORDER — FUROSEMIDE 10 MG/ML IJ SOLN
40.0000 mg | Freq: Four times a day (QID) | INTRAMUSCULAR | Status: AC
  Administered 2020-05-30 (×2): 40 mg via INTRAVENOUS
  Filled 2020-05-30 (×2): qty 4

## 2020-05-30 MED ORDER — LABETALOL HCL 5 MG/ML IV SOLN
20.0000 mg | INTRAVENOUS | Status: AC | PRN
  Administered 2020-05-31 – 2020-06-03 (×10): 20 mg via INTRAVENOUS
  Filled 2020-05-30 (×12): qty 4

## 2020-05-30 MED ORDER — PREDNISONE 5 MG/5ML PO SOLN
40.0000 mg | Freq: Every day | ORAL | Status: DC
  Administered 2020-05-31 – 2020-06-01 (×2): 40 mg
  Filled 2020-05-30 (×4): qty 40

## 2020-05-30 MED ORDER — VITAL 1.5 CAL PO LIQD
1000.0000 mL | ORAL | Status: DC
  Administered 2020-05-30 – 2020-06-05 (×5): 1000 mL
  Filled 2020-05-30 (×8): qty 1000

## 2020-05-30 MED ORDER — EPINEPHRINE 1 MG/10ML IJ SOSY
PREFILLED_SYRINGE | INTRAMUSCULAR | Status: AC
  Administered 2020-05-30: 1 mg via INTRAVENOUS
  Filled 2020-05-30: qty 10

## 2020-05-30 MED ORDER — FUROSEMIDE 10 MG/ML IJ SOLN: 40.0000 mg | Freq: Four times a day (QID) | INTRAMUSCULAR | Status: DC

## 2020-05-30 NOTE — Procedures (Signed)
Cortrak ° °Tube Type:  Cortrak - 43 inches °Tube Location:  Left nare °Initial Placement:  Stomach °Secured by: Bridle °Technique Used to Measure Tube Placement:  Documented cm marking at nare/ corner of mouth °Cortrak Secured At:  70 cm ° ° ° °Cortrak Tube Team Note: ° °Consult received to place a Cortrak feeding tube.  ° °No x-ray is required. RN may begin using tube.  ° °If the tube becomes dislodged please keep the tube and contact the Cortrak team at [email or web address] (password [email or web address]) for replacement.  °If after hours and replacement cannot be delayed, place a NG tube and confirm placement with an abdominal x-ray.  ° ° °Monic Engelmann MS, RD, LDN °Please refer to AMION for RD and/or RD on-call/weekend/after hours pager ° °

## 2020-05-30 NOTE — Progress Notes (Signed)
NAME:  Brian Mack, MRN:  [id number], DOB:  [birth year], LOS: 15 ADMISSION DATE:  06/09/2020, CONSULTATION DATE:  9/2 REFERRING MD:  Ophelia Charter, CHIEF COMPLAINT:  Dyspnea   Brief History   34 y/o male admitted on 9/2 with COVID pneumonia after testing positive on 8/26.  PCCM consulted for admission on 9/3, treated by [facility] on progressive care, then condition worsened by 9/9 and he was eventually intubated on 9/11.   Past Medical History  Obesity  Significant Hospital Events   9/15 off NO 9/17 brady event, hypothermic  Consults:   Procedures:  ETT 9/11 CVC 9/11 A-line 9/11  Significant Diagnostic Tests:  9/5 CT abd/pelvis > mild right hydrouteronephrosis with stone in mid ureter, diffuse pulmonary parenchymal findings consistent with pneumonia, suspected gallstone without acute cholecystitis, prior chronic inflammatory changes in distal ileum and ascending colon.  9/7 Echo> LVEF 60-65%, RV size, function normal, valves OK 9/11 Echo> LVEF 70-75%, RV size, function is normal, valves OK  Micro Data:  8/26 SARS COV 2 > positive 9/2 blood >  9/11 resp >  9/11 blood >   Antimicrobials:  Ceftriaxone 9/2>> 9/6 Azithromycin 9/2>>  9/6 Cefepime 9/11>9/14 vanc 9/11>9/14 Eraxis 9/11>9/13  Interim history/subjective:   Oxygenation slightly better plateua low 30's Needs cor trak Supine today Bradycardic event today while prone, temperature low 32.7 axillary   Objective   Blood pressure 111/66, pulse 61, temperature 98.2 F (36.8 C), temperature source Oral, resp. rate (!) 62, height 5\' 9"  (1.753 m), weight 108.3 kg, SpO2 91 %.    Vent Mode: PRVC FiO2 (%):  [80 %] 80 % Set Rate:  [35 bmp] 35 bmp Vt Set:  [390 mL] 390 mL PEEP:  [12 cmH20-16 cmH20] 16 cmH20 Plateau Pressure:  [31 cmH20-36 cmH20] 35 cmH20   Intake/Output Summary (Last 24 hours) at 05/30/2020 0739 Last data filed at 05/30/2020 0500 Gross per 24 hour  Intake 1745.46 ml  Output 1790 ml  Net -44.54 ml   Filed  Weights   05/27/20 0500 05/28/20 0500 05/29/20 0500  Weight: 108.4 kg 108.4 kg 108.3 kg    Examination:  General:  In bed on vent HENT: NCAT ETT in place PULM: Crackles B, vent supported breathing CV: cannot assess> prone GI: some bowel sounds, flanks soft MSK: normal bulk and tone Neuro: sedated on vent  9/17 CXR > severe bilateral airspace disease  Resolved Hospital Problem list     Assessment & Plan:  COVID 19, severe ARDS Continue mechanical ventilation per ARDS protocol Target TVol 6-8cc/kgIBW Target Plateau Pressure < 30cm H20 Target driving pressure less than 15 cm of water Target PaO2 55-65: titrate PEEP/FiO2 per protocol As long as PaO2 to FiO2 ratio is less than 1:150 position in prone position for 16 hours a day Check CVP daily if CVL in place Target CVP less than 4, diurese as necessary Ventilator associated pneumonia prevention protocol 9/17 change solumedrol to prednisone, move back to supine for cor trak, continue baricitinib, lasix  Need for sedation for mechanical ventilation NMB protocol to continue  RASS goal -5 Fentanyl, versed  Bradycardia> due to precedex and hypothermia (exposure) Tele Hold precedex  Bear hugger  HCAP> resolved Monitor off antibiotics  Hyperglycemia SSI  AKI resolved Ureteral colic in setting of mid R ureter stone Monitor BMET and UOP Replace electrolytes as needed  Best practice:  Diet: tube feeding (replace cor track on Friday) Pain/Anxiety/Delirium protocol (if indicated): as above VAP protocol (if indicated): yes DVT prophylaxis: lovenox GI prophylaxis: Pantoprazole for  stress ulcer prophylaxis Glucose control: SSI Mobility: bed rest Code Status: full Family Communication: I called his mother Dillard Cannon Disposition: remain in ICU  Labs   CBC: Recent Labs  Lab 05/24/20 0237 05/24/20 0503 05/25/20 0341 05/25/20 1710 05/27/20 0507 05/27/20 1112 05/27/20 2040 05/28/20 2151 05/29/20 0445  05/29/20 2142 05/30/20 0358  WBC 28.6*  --  33.4*  --  19.7*  --   --   --  18.7*  --  16.3*  NEUTROABS  --   --   --   --   --   --   --   --   --   --  13.5*  HGB 17.9*   < > 14.9   < > 12.9*   < > 12.9* 14.6 13.7 14.3 13.3  HCT 55.3*   < > 49.3   < > 44.0   < > 38.0* 43.0 45.2 42.0 45.1  MCV 83.9  --  90.5  --  92.1  --   --   --  89.7  --  92.6  PLT 471*  --  390  --  304  --   --   --  260  --  211   < > = values in this interval not displayed.    Basic Metabolic Panel: Recent Labs  Lab 05/25/20 0341 05/25/20 0341 05/25/20 1710 05/26/20 0317 05/26/20 1216 05/27/20 0057 05/27/20 0507 05/27/20 1112 05/28/20 0413 05/28/20 2151 05/29/20 0445 05/29/20 2142 05/30/20 0358  NA 141   < >   < >  --  141   < > 139   < > 138 139 143 141 141  K 5.1   < >   < >  --  5.0   < > 4.8   < > 4.3 4.6 4.1 5.7* 4.9  CL 95*   < >  --   --  94*  --  92*  --  92*  --  96*  --  94*  CO2 39*   < >  --   --  39*  --  40*  --  38*  --  39*  --  39*  GLUCOSE 233*   < >  --   --  175*  --  186*  --  169*  --  185*  --  103*  BUN 27*   < >  --   --  26*  --  25*  --  25*  --  31*  --  31*  CREATININE 1.20   < >  --   --  0.92  --  0.91  --  0.83  --  0.80  --  0.87  CALCIUM 7.5*   < >  --   --  8.1*  --  8.1*  --  8.3*  --  8.2*  --  8.2*  MG 2.6*  --   --  2.4  --   --  2.2  --  2.3  --   --   --   --   PHOS 5.8*  --   --  3.5  --   --  4.1  --  3.4  --   --   --   --    < > = values in this interval not displayed.   GFR: Estimated Creatinine Clearance: 146.4 mL/min (by C-G formula based on SCr of 0.87 mg/dL). Recent Labs  Lab 05/23/20 0805 05/24/20 0237 05/25/20 0341 05/27/20 0507 05/29/20  4650 05/30/20 0358  PROCALCITON <0.10  --   --   --   --   --   WBC 30.0*   < > 33.4* 19.7* 18.7* 16.3*   < > = values in this interval not displayed.    Liver Function Tests: Recent Labs  Lab 05/23/20 0805 05/24/20 0237 05/25/20 0341 05/30/20 0358  AST 27 33 43* 25  ALT 40 40 67* 54*  ALKPHOS  65 93 94 64  BILITOT 0.9 1.0 0.7 0.4  PROT 6.3* 7.7 5.9* 5.3*  ALBUMIN 2.8* 3.3* 2.5* 2.5*   No results for input(s): LIPASE, AMYLASE in the last 168 hours. No results for input(s): AMMONIA in the last 168 hours.  ABG    Component Value Date/Time   PHART 7.371 05/29/2020 2142   PCO2ART 85.3 (HH) 05/29/2020 2142   PO2ART 83 05/29/2020 2142   HCO3 49.4 (H) 05/29/2020 2142   TCO2 >50 (H) 05/29/2020 2142   O2SAT 95.0 05/29/2020 2142     Coagulation Profile: No results for input(s): INR, PROTIME in the last 168 hours.  Cardiac Enzymes: No results for input(s): CKTOTAL, CKMB, CKMBINDEX, TROPONINI in the last 168 hours.  HbA1C: Hgb A1c MFr Bld  Date/Time Value Ref Range Status  05/25/2020 10:24 AM 6.4 (H) 4.8 - 5.6 % Final    Comment:    (NOTE) Pre diabetes:          5.7%-6.4%  Diabetes:              >6.4%  Glycemic control for   <7.0% adults with diabetes     CBG: Recent Labs  Lab 05/29/20 1127 05/29/20 1523 05/29/20 1945 05/29/20 2324 05/30/20 0400  GLUCAP 120* 179* 130* 102* 103*       Critical care time: 35 minutes    Heber [place], MD [place] [facility] Pager: [phone] Cell: [phone] If no response, call [phone]

## 2020-05-30 NOTE — Progress Notes (Signed)
Nutrition Follow-up  DOCUMENTATION CODES:   Obesity unspecified  INTERVENTION:   Tube Feeding via Cortrak: Vital 1.5 at 60 ml/hr Pro-Source TF 90 mL TID Provides 2400 kcals, 163 g of protein, 1094 mL of free water Meets 100% estimaed calorie and protein needs   NUTRITION DIAGNOSIS:   Inadequate oral intake related to acute illness, poor appetite as evidenced by meal completion < 25%, NPO status.  Being addressed via TF   GOAL:   Patient will meet greater than or equal to 90% of their needs  Progressing  MONITOR:   Labs, Weight trends, TF tolerance, Vent status, Skin  REASON FOR ASSESSMENT:   Consult, Ventilator Enteral/tube feeding initiation and management  ASSESSMENT:   34 yo unvaccinated male admitted with severe COVID-19 pneumonia on 9/2 with progressive decline requiring intubation on 9/1 with severe ARDS, worsening AKI. No PMH   9/02 Admitted 9/11 Intubated 9/16 OG becaome dislodged while in prone positio, unable to replace 9/17 Hypothermic, Cortrak placed  Pt remains on vent support, requiring prone positioning, currently supine, requiring paralytic, sedated on fentanyl, versed and precedex  Yesterday OG tube became dislodged, TF infusing everywhere. Noted attempts to replace OG tube yesterday were unsuccessful. Cortrak tube placed today  Pt previously tolerating TF   Current wt 108.3 kg; admit weight 115.8 kg  Labs: CBGs 103-176, Creatinine wdl, potassium wdl, sodium wdl Meds: ss novolog, lasix, prednisone   Diet Order:   Diet Order            Diet NPO time specified  Diet effective now                 EDUCATION NEEDS:   Not appropriate for education at this time  Skin:  Skin Assessment: Skin Integrity Issues: Skin Integrity Issues:: Other (Comment) Other: MASD  Last BM:  9/17  Height:   Ht Readings from Last 1 Encounters:  05/29/20 5\' 9"  (1.753 m)    Weight:   Wt Readings from Last 1 Encounters:  05/29/20 108.3 kg     Ideal Body Weight:   (IBW: 72.7 kg, Adjusted BW: 89.9 kg)  BMI:  Body mass index is 35.26 kg/m.  Estimated Nutritional Needs:   Kcal:  2150-2605 kcals  Protein:  140-180 g  Fluid:  >/= 2 L   04-21-1995 MS, RDN, LDN, CNSC Registered Dietitian III Clinical Nutrition RD Pager and On-Call Pager Number Located in [place]

## 2020-05-30 NOTE — Progress Notes (Addendum)
Attempted foley exchange to place temperature foley d/t hypothermia as requested by Dr. Kendrick Fries. Unable to place 14 Fr. Temperature foley by myself and Heloise Beecham, RN.   16 Fr. Foley placed successfully. Will attempt to place rectal or esophageal temperature probe if it becomes necessary. Dr. Kendrick Fries aware.

## 2020-05-30 NOTE — Progress Notes (Signed)
Patient placed in supine position by MD x 1, RT x 2, RN x 4 without complications due to patient being unstable.  ETT remains secure at 27 cm with cloth tape.

## 2020-05-31 ENCOUNTER — Inpatient Hospital Stay (HOSPITAL_COMMUNITY): Payer: HRSA Program

## 2020-05-31 LAB — CBC WITH DIFFERENTIAL/PLATELET
Abs Immature Granulocytes: 0.31 10*3/uL — ABNORMAL HIGH (ref 0.00–0.07)
Basophils Absolute: 0.1 10*3/uL (ref 0.0–0.1)
Basophils Relative: 0 %
Eosinophils Absolute: 0.1 10*3/uL (ref 0.0–0.5)
Eosinophils Relative: 1 %
HCT: 45.5 % (ref 39.0–52.0)
Hemoglobin: 13.6 g/dL (ref 13.0–17.0)
Immature Granulocytes: 2 %
Lymphocytes Relative: 7 %
Lymphs Abs: 1.2 10*3/uL (ref 0.7–4.0)
MCH: 27.8 pg (ref 26.0–34.0)
MCHC: 29.9 g/dL — ABNORMAL LOW (ref 30.0–36.0)
MCV: 93 fL (ref 80.0–100.0)
Monocytes Absolute: 1.2 10*3/uL — ABNORMAL HIGH (ref 0.1–1.0)
Monocytes Relative: 7 %
Neutro Abs: 14 10*3/uL — ABNORMAL HIGH (ref 1.7–7.7)
Neutrophils Relative %: 83 %
Platelets: 199 10*3/uL (ref 150–400)
RBC: 4.89 MIL/uL (ref 4.22–5.81)
RDW: 12.9 % (ref 11.5–15.5)
WBC: 16.9 10*3/uL — ABNORMAL HIGH (ref 4.0–10.5)
nRBC: 0 % (ref 0.0–0.2)

## 2020-05-31 LAB — COMPREHENSIVE METABOLIC PANEL
ALT: 55 U/L — ABNORMAL HIGH (ref 0–44)
AST: 28 U/L (ref 15–41)
Albumin: 2.5 g/dL — ABNORMAL LOW (ref 3.5–5.0)
Alkaline Phosphatase: 80 U/L (ref 38–126)
Anion gap: 10 (ref 5–15)
BUN: 41 mg/dL — ABNORMAL HIGH (ref 6–20)
CO2: 41 mmol/L — ABNORMAL HIGH (ref 22–32)
Calcium: 8.5 mg/dL — ABNORMAL LOW (ref 8.9–10.3)
Chloride: 92 mmol/L — ABNORMAL LOW (ref 98–111)
Creatinine, Ser: 1.02 mg/dL (ref 0.61–1.24)
GFR calc Af Amer: >60 mL/min (ref >60)
GFR calc non Af Amer: >60 mL/min (ref >60)
Glucose, Bld: 187 mg/dL — ABNORMAL HIGH (ref 70–99)
Potassium: 3.7 mmol/L (ref 3.5–5.1)
Sodium: 143 mmol/L (ref 135–145)
Total Bilirubin: 0.5 mg/dL (ref 0.3–1.2)
Total Protein: 5.3 g/dL — ABNORMAL LOW (ref 6.5–8.1)

## 2020-05-31 LAB — BLOOD GAS, ARTERIAL
Acid-Base Excess: 19.7 mmol/L — ABNORMAL HIGH (ref 0.0–2.0)
Bicarbonate: 47.3 mmol/L — ABNORMAL HIGH (ref 20.0–28.0)
Drawn by: 213381
FIO2: 80
O2 Saturation: 93.8 %
Patient temperature: 36.7
pCO2 arterial: 105 mmHg (ref 32.0–48.0)
pH, Arterial: 7.276 — ABNORMAL LOW (ref 7.350–7.450)
pO2, Arterial: 82 mmHg — ABNORMAL LOW (ref 83.0–108.0)

## 2020-05-31 LAB — POCT I-STAT 7, (LYTES, BLD GAS, ICA,H+H)
Acid-Base Excess: 20 mmol/L — ABNORMAL HIGH (ref 0.0–2.0)
Bicarbonate: 49.4 mmol/L — ABNORMAL HIGH (ref 20.0–28.0)
Calcium, Ion: 1.16 mmol/L (ref 1.15–1.40)
HCT: 39 % (ref 39.0–52.0)
Hemoglobin: 13.3 g/dL (ref 13.0–17.0)
O2 Saturation: 91 %
Patient temperature: 98.3
Potassium: 3.6 mmol/L (ref 3.5–5.1)
Sodium: 143 mmol/L (ref 135–145)
TCO2: >50 mmol/L — ABNORMAL HIGH (ref 22–32)
pCO2 arterial: 81.7 mmHg (ref 32.0–48.0)
pH, Arterial: 7.389 (ref 7.350–7.450)
pO2, Arterial: 67 mmHg — ABNORMAL LOW (ref 83.0–108.0)

## 2020-05-31 LAB — GLUCOSE, CAPILLARY
Glucose-Capillary: 124 mg/dL — ABNORMAL HIGH (ref 70–99)
Glucose-Capillary: 125 mg/dL — ABNORMAL HIGH (ref 70–99)
Glucose-Capillary: 151 mg/dL — ABNORMAL HIGH (ref 70–99)
Glucose-Capillary: 162 mg/dL — ABNORMAL HIGH (ref 70–99)
Glucose-Capillary: 183 mg/dL — ABNORMAL HIGH (ref 70–99)
Glucose-Capillary: 217 mg/dL — ABNORMAL HIGH (ref 70–99)

## 2020-05-31 MED ORDER — ARTIFICIAL TEARS OPHTHALMIC OINT
1.0000 "application " | TOPICAL_OINTMENT | Freq: Three times a day (TID) | OPHTHALMIC | Status: DC
  Administered 2020-05-31 – 2020-06-09 (×25): 1 via OPHTHALMIC
  Filled 2020-05-31: qty 3.5

## 2020-05-31 MED ORDER — PANTOPRAZOLE SODIUM 40 MG PO PACK
40.0000 mg | PACK | Freq: Every day | ORAL | Status: DC
  Administered 2020-05-31 – 2020-06-15 (×16): 40 mg
  Filled 2020-05-31 (×16): qty 20

## 2020-05-31 MED ORDER — SODIUM CHLORIDE 0.9 % IV SOLN
1.0000 mg/kg/h | INTRAVENOUS | Status: DC
  Administered 2020-05-31 – 2020-06-02 (×7): 3 mg/kg/h via INTRAVENOUS
  Administered 2020-06-03: 2 mg/kg/h via INTRAVENOUS
  Administered 2020-06-03: 3 mg/kg/h via INTRAVENOUS
  Administered 2020-06-04 – 2020-06-08 (×9): 2 mg/kg/h via INTRAVENOUS
  Administered 2020-06-09: 1 mg/kg/h via INTRAVENOUS
  Filled 2020-05-31 (×29): qty 25

## 2020-05-31 MED ORDER — VECURONIUM BROMIDE 10 MG IV SOLR
10.0000 mg | INTRAVENOUS | Status: DC | PRN
  Administered 2020-06-03 – 2020-06-08 (×16): 10 mg via INTRAVENOUS
  Filled 2020-05-31 (×10): qty 10

## 2020-05-31 MED ORDER — FUROSEMIDE 10 MG/ML IJ SOLN
40.0000 mg | Freq: Four times a day (QID) | INTRAMUSCULAR | Status: AC
  Administered 2020-05-31 (×2): 40 mg via INTRAVENOUS
  Filled 2020-05-31 (×2): qty 4

## 2020-05-31 MED ORDER — SODIUM CHLORIDE 0.9 % IV SOLN
1.0000 mg/kg/h | INTRAVENOUS | Status: DC
  Administered 2020-05-31: 2 mg/kg/h via INTRAVENOUS
  Administered 2020-05-31: 1 mg/kg/h via INTRAVENOUS
  Filled 2020-05-31 (×4): qty 5

## 2020-05-31 MED ORDER — POTASSIUM CHLORIDE 20 MEQ PO PACK
40.0000 meq | PACK | Freq: Once | ORAL | Status: AC
  Administered 2020-05-31: 40 meq
  Filled 2020-05-31: qty 2

## 2020-05-31 NOTE — Progress Notes (Signed)
NAME:  Brian Mack, MRN:  [id number], DOB:  [birth year], LOS: 16 ADMISSION DATE:  06/04/2020, CONSULTATION DATE:  9/2 REFERRING MD:  Ophelia Charter, CHIEF COMPLAINT:  Dyspnea   Brief History   34 y/o male admitted on 9/2 with COVID pneumonia after testing positive on 8/26.  PCCM consulted for admission on 9/3, treated by [facility] on progressive care, then condition worsened by 9/9 and he was eventually intubated on 9/11.   Past Medical History  Obesity  Significant Hospital Events   9/15 off NO 9/17 brady event, hypothermic  Consults:   Procedures:  ETT 9/11 CVC 9/11 A-line 9/11  Significant Diagnostic Tests:  9/5 CT abd/pelvis > mild right hydrouteronephrosis with stone in mid ureter, diffuse pulmonary parenchymal findings consistent with pneumonia, suspected gallstone without acute cholecystitis, prior chronic inflammatory changes in distal ileum and ascending colon.  9/7 Echo> LVEF 60-65%, RV size, function normal, valves OK 9/11 Echo> LVEF 70-75%, RV size, function is normal, valves OK  Micro Data:  8/26 SARS COV 2 > positive 9/2 blood >  9/11 resp >  9/11 blood >   Antimicrobials:  Ceftriaxone 9/2>> 9/6 Azithromycin 9/2>>  9/6 Cefepime 9/11>9/14 vanc 9/11>9/14 Eraxis 9/11>9/13  Interim history/subjective:   No more brady spells Good UOP, good  Plateau 32 Severe vent dyssynchrony when stopping paralytic yesterday  Objective   Blood pressure (!) 88/60, pulse 76, temperature 98.3 F (36.8 C), temperature source Oral, resp. rate (!) 35, height 5\' 9"  (1.753 m), weight 108.3 kg, SpO2 94 %.    Vent Mode: PRVC FiO2 (%):  [80 %-100 %] 90 % Set Rate:  [35 bmp] 35 bmp Vt Set:  [390 mL] 390 mL PEEP:  [16 cmH20] 16 cmH20 Plateau Pressure:  [32 cmH20-33 cmH20] 33 cmH20   Intake/Output Summary (Last 24 hours) at 05/31/2020 0802 Last data filed at 05/31/2020 0600 Gross per 24 hour  Intake 5182.76 ml  Output 3955 ml  Net 1227.76 ml   Filed Weights   05/27/20 0500 05/28/20  0500 05/29/20 0500  Weight: 108.4 kg 108.4 kg 108.3 kg    Examination:  General:  In bed on vent HENT: NCAT ETT in place PULM: Crackles bases B, vent supported breathing CV: RRR, no mgr GI: BS+, soft, nontender MSK: normal bulk and tone Neuro: sedated on vent  9/18 CXR > severe bilateral airspace disease, ett  Resolved Hospital Problem list     Assessment & Plan:  COVID 19, severe ARDS Continue mechanical ventilation per ARDS protocol Target TVol 6-8cc/kgIBW Target Plateau Pressure < 30cm H20 Target driving pressure less than 15 cm of water Target PaO2 55-65: titrate PEEP/FiO2 per protocol As long as PaO2 to FiO2 ratio is less than 1:150 position in prone position for 16 hours a day Check CVP daily if CVL in place Target CVP less than 4, diurese as necessary Ventilator associated pneumonia prevention protocol 9/18 check supine ABG, prone today, continue prednisone, lasix  Need for sedation for mechanical ventilation NMB to continue > change to intermittent Continue ketamine, fentanyl, versed Stop precedex RASS goal -5  Bradycardia> due to precedex and hypothermia (exposure), resolved Tele Bear hugger  HCAP> resolved Monitor off antibiotics  Hyperglycemia SSI  AKI resolved Ureteral colic in setting of mid R ureter stone Monitor BMET and UOP Replace electrolytes as needed   Best practice:  Diet: tube feeding (replace cor track on Friday) Pain/Anxiety/Delirium protocol (if indicated): as above VAP protocol (if indicated): yes DVT prophylaxis: lovenox GI prophylaxis: Pantoprazole for stress ulcer prophylaxis Glucose  control: SSI Mobility: bed rest Code Status: full Family Communication: I called his father Jonathan Corpus on 9/18 at 11:48 EST, no answer, left message Disposition: remain in ICU  Labs   CBC: Recent Labs  Lab 05/25/20 0341 05/25/20 1710 05/27/20 0507 05/27/20 1112 05/29/20 0445 05/29/20 0445 05/29/20 2142 05/30/20 0358  05/30/20 1041 05/30/20 1350 05/31/20 0414  WBC 33.4*  --  19.7*  --  18.7*  --   --  16.3*  --   --  16.9*  NEUTROABS  --   --   --   --   --   --   --  13.5*  --   --  14.0*  HGB 14.9   < > 12.9*   < > 13.7   < > 14.3 13.3 13.3 15.3 13.6  HCT 49.3   < > 44.0   < > 45.2   < > 42.0 45.1 39.0 45.0 45.5  MCV 90.5  --  92.1  --  89.7  --   --  92.6  --   --  93.0  PLT 390  --  304  --  260  --   --  211  --   --  199   < > = values in this interval not displayed.    Basic Metabolic Panel: Recent Labs  Lab 05/25/20 0341 05/25/20 1710 05/26/20 0317 05/26/20 1216 05/27/20 0507 05/27/20 1112 05/28/20 0413 05/28/20 2151 05/29/20 0445 05/29/20 0445 05/29/20 2142 05/30/20 0358 05/30/20 1041 05/30/20 1350 05/31/20 0414  NA 141   < >  --    < > 139   < > 138   < > 143   < > 141 141 142 142 143  K 5.1   < >  --    < > 4.8   < > 4.3   < > 4.1   < > 5.7* 4.9 4.4 4.8 3.7  CL 95*  --   --    < > 92*  --  92*  --  96*  --   --  94*  --   --  92*  CO2 39*  --   --    < > 40*  --  38*  --  39*  --   --  39*  --   --  41*  GLUCOSE 233*  --   --    < > 186*  --  169*  --  185*  --   --  103*  --   --  187*  BUN 27*  --   --    < > 25*  --  25*  --  31*  --   --  31*  --   --  41*  CREATININE 1.20  --   --    < > 0.91  --  0.83  --  0.80  --   --  0.87  --   --  1.02  CALCIUM 7.5*  --   --    < > 8.1*  --  8.3*  --  8.2*  --   --  8.2*  --   --  8.5*  MG 2.6*  --  2.4  --  2.2  --  2.3  --   --   --   --   --   --   --   --   PHOS 5.8*  --  3.5  --  4.1  --  3.4  --   --   --   --   --   --   --   --    < > =  values in this interval not displayed.   GFR: Estimated Creatinine Clearance: 124.9 mL/min (by C-G formula based on SCr of 1.02 mg/dL). Recent Labs  Lab 05/27/20 0507 05/29/20 0445 05/30/20 0358 05/31/20 0414  WBC 19.7* 18.7* 16.3* 16.9*    Liver Function Tests: Recent Labs  Lab 05/25/20 0341 05/30/20 0358 05/31/20 0414  AST 43* 25 28  ALT 67* 54* 55*  ALKPHOS 94 64 80   BILITOT 0.7 0.4 0.5  PROT 5.9* 5.3* 5.3*  ALBUMIN 2.5* 2.5* 2.5*   No results for input(s): LIPASE, AMYLASE in the last 168 hours. No results for input(s): AMMONIA in the last 168 hours.  ABG    Component Value Date/Time   PHART 7.335 (L) 05/30/2020 1350   PCO2ART 94.6 (HH) 05/30/2020 1350   PO2ART 55 (L) 05/30/2020 1350   HCO3 50.7 (H) 05/30/2020 1350   TCO2 >50 (H) 05/30/2020 1350   O2SAT 84.0 05/30/2020 1350     Coagulation Profile: No results for input(s): INR, PROTIME in the last 168 hours.  Cardiac Enzymes: No results for input(s): CKTOTAL, CKMB, CKMBINDEX, TROPONINI in the last 168 hours.  HbA1C: Hgb A1c MFr Bld  Date/Time Value Ref Range Status  05/25/2020 10:24 AM 6.4 (H) 4.8 - 5.6 % Final    Comment:    (NOTE) Pre diabetes:          5.7%-6.4%  Diabetes:              >6.4%  Glycemic control for   <7.0% adults with diabetes     CBG: Recent Labs  Lab 05/30/20 1131 05/30/20 1541 05/30/20 1940 05/30/20 2320 05/31/20 0346  GLUCAP 141* 176* 155* 117* 125*       Critical care time: 35 minutes    Heber Thurmont, MD [place] [facility] Pager: [phone] Cell: [phone] If no response, call [phone]

## 2020-05-31 NOTE — Progress Notes (Signed)
Pt moved to prone position by RT and RNx5 with no issues noted.  PT tolerated well, RT will continue to monitor.

## 2020-06-01 ENCOUNTER — Inpatient Hospital Stay (HOSPITAL_COMMUNITY): Payer: HRSA Program

## 2020-06-01 ENCOUNTER — Inpatient Hospital Stay: Payer: Self-pay

## 2020-06-01 LAB — POCT I-STAT 7, (LYTES, BLD GAS, ICA,H+H)
Acid-Base Excess: 20 mmol/L — ABNORMAL HIGH (ref 0.0–2.0)
Bicarbonate: 50 mmol/L — ABNORMAL HIGH (ref 20.0–28.0)
Calcium, Ion: 1.18 mmol/L (ref 1.15–1.40)
HCT: 39 % (ref 39.0–52.0)
Hemoglobin: 13.3 g/dL (ref 13.0–17.0)
O2 Saturation: 91 %
Patient temperature: 95.2
Potassium: 3.6 mmol/L (ref 3.5–5.1)
Sodium: 145 mmol/L (ref 135–145)
TCO2: >50 mmol/L — ABNORMAL HIGH (ref 22–32)
pCO2 arterial: 74.3 mmHg (ref 32.0–48.0)
pH, Arterial: 7.428 (ref 7.350–7.450)
pO2, Arterial: 58 mmHg — ABNORMAL LOW (ref 83.0–108.0)

## 2020-06-01 LAB — URINALYSIS, ROUTINE W REFLEX MICROSCOPIC
Bilirubin Urine: NEGATIVE
Glucose, UA: NEGATIVE mg/dL
Hgb urine dipstick: NEGATIVE
Ketones, ur: NEGATIVE mg/dL
Leukocytes,Ua: NEGATIVE
Nitrite: NEGATIVE
Protein, ur: NEGATIVE mg/dL
Specific Gravity, Urine: 1.024 (ref 1.005–1.030)
pH: 6 (ref 5.0–8.0)

## 2020-06-01 LAB — GLUCOSE, CAPILLARY
Glucose-Capillary: 130 mg/dL — ABNORMAL HIGH (ref 70–99)
Glucose-Capillary: 134 mg/dL — ABNORMAL HIGH (ref 70–99)
Glucose-Capillary: 146 mg/dL — ABNORMAL HIGH (ref 70–99)
Glucose-Capillary: 157 mg/dL — ABNORMAL HIGH (ref 70–99)
Glucose-Capillary: 184 mg/dL — ABNORMAL HIGH (ref 70–99)
Glucose-Capillary: 210 mg/dL — ABNORMAL HIGH (ref 70–99)

## 2020-06-01 LAB — CBC WITH DIFFERENTIAL/PLATELET
Abs Immature Granulocytes: 0.33 10*3/uL — ABNORMAL HIGH (ref 0.00–0.07)
Basophils Absolute: 0 10*3/uL (ref 0.0–0.1)
Basophils Relative: 0 %
Eosinophils Absolute: 0.2 10*3/uL (ref 0.0–0.5)
Eosinophils Relative: 1 %
HCT: 43.3 % (ref 39.0–52.0)
Hemoglobin: 13 g/dL (ref 13.0–17.0)
Immature Granulocytes: 2 %
Lymphocytes Relative: 9 %
Lymphs Abs: 1.5 10*3/uL (ref 0.7–4.0)
MCH: 28.2 pg (ref 26.0–34.0)
MCHC: 30 g/dL (ref 30.0–36.0)
MCV: 93.9 fL (ref 80.0–100.0)
Monocytes Absolute: 1.3 10*3/uL — ABNORMAL HIGH (ref 0.1–1.0)
Monocytes Relative: 8 %
Neutro Abs: 13.2 10*3/uL — ABNORMAL HIGH (ref 1.7–7.7)
Neutrophils Relative %: 80 %
Platelets: 209 10*3/uL (ref 150–400)
RBC: 4.61 MIL/uL (ref 4.22–5.81)
RDW: 13.1 % (ref 11.5–15.5)
WBC: 16.5 10*3/uL — ABNORMAL HIGH (ref 4.0–10.5)
nRBC: 0 % (ref 0.0–0.2)

## 2020-06-01 LAB — COMPREHENSIVE METABOLIC PANEL
ALT: 42 U/L (ref 0–44)
AST: 18 U/L (ref 15–41)
Albumin: 2.3 g/dL — ABNORMAL LOW (ref 3.5–5.0)
Alkaline Phosphatase: 74 U/L (ref 38–126)
Anion gap: 8 (ref 5–15)
BUN: 35 mg/dL — ABNORMAL HIGH (ref 6–20)
CO2: 42 mmol/L — ABNORMAL HIGH (ref 22–32)
Calcium: 8.4 mg/dL — ABNORMAL LOW (ref 8.9–10.3)
Chloride: 95 mmol/L — ABNORMAL LOW (ref 98–111)
Creatinine, Ser: 0.73 mg/dL (ref 0.61–1.24)
GFR calc Af Amer: >60 mL/min (ref >60)
GFR calc non Af Amer: >60 mL/min (ref >60)
Glucose, Bld: 113 mg/dL — ABNORMAL HIGH (ref 70–99)
Potassium: 3.7 mmol/L (ref 3.5–5.1)
Sodium: 145 mmol/L (ref 135–145)
Total Bilirubin: 0.7 mg/dL (ref 0.3–1.2)
Total Protein: 5.3 g/dL — ABNORMAL LOW (ref 6.5–8.1)

## 2020-06-01 MED ORDER — SODIUM CHLORIDE 0.9 % IV SOLN
2.0000 g | Freq: Three times a day (TID) | INTRAVENOUS | Status: AC
  Administered 2020-06-01 – 2020-06-04 (×11): 2 g via INTRAVENOUS
  Filled 2020-06-01 (×11): qty 2

## 2020-06-01 MED ORDER — VANCOMYCIN HCL IN DEXTROSE 1-5 GM/200ML-% IV SOLN
1000.0000 mg | Freq: Three times a day (TID) | INTRAVENOUS | Status: DC
  Administered 2020-06-01 – 2020-06-03 (×5): 1000 mg via INTRAVENOUS
  Filled 2020-06-01 (×5): qty 200

## 2020-06-01 MED ORDER — AMLODIPINE BESYLATE 5 MG PO TABS
5.0000 mg | ORAL_TABLET | Freq: Every day | ORAL | Status: DC
  Filled 2020-06-01: qty 1

## 2020-06-01 MED ORDER — AMLODIPINE BESYLATE 5 MG PO TABS
5.0000 mg | ORAL_TABLET | Freq: Every day | ORAL | Status: DC
  Administered 2020-06-01 – 2020-06-03 (×3): 5 mg
  Filled 2020-06-01 (×3): qty 1

## 2020-06-01 MED ORDER — FUROSEMIDE 10 MG/ML IJ SOLN
40.0000 mg | Freq: Four times a day (QID) | INTRAMUSCULAR | Status: AC
  Administered 2020-06-01 (×3): 40 mg via INTRAVENOUS
  Filled 2020-06-01 (×3): qty 4

## 2020-06-01 MED ORDER — PREDNISONE 20 MG PO TABS
40.0000 mg | ORAL_TABLET | Freq: Every day | ORAL | Status: DC
  Administered 2020-06-02 – 2020-06-04 (×3): 40 mg
  Filled 2020-06-01 (×3): qty 2

## 2020-06-01 MED ORDER — VANCOMYCIN HCL 2000 MG/400ML IV SOLN
2000.0000 mg | Freq: Once | INTRAVENOUS | Status: AC
  Administered 2020-06-01: 2000 mg via INTRAVENOUS
  Filled 2020-06-01: qty 400

## 2020-06-01 MED ORDER — HYDRALAZINE HCL 20 MG/ML IJ SOLN
10.0000 mg | INTRAMUSCULAR | Status: DC | PRN
  Administered 2020-06-01 – 2020-06-04 (×5): 20 mg via INTRAVENOUS
  Administered 2020-06-04: 10 mg via INTRAVENOUS
  Administered 2020-06-05 – 2020-06-10 (×7): 20 mg via INTRAVENOUS
  Filled 2020-06-01 (×13): qty 1

## 2020-06-01 NOTE — Progress Notes (Signed)
NAME:  Killian Schwer, MRN:  [id number], DOB:  [birth year], LOS: 17 ADMISSION DATE:  05/24/2020, CONSULTATION DATE:  9/2 REFERRING MD:  Ophelia Charter, CHIEF COMPLAINT:  Dyspnea   Brief History   34 y/o male admitted on 9/2 with COVID pneumonia after testing positive on 8/26.  PCCM consulted for admission on 9/3, treated by [facility] on progressive care, then condition worsened by 9/9 and he was eventually intubated on 9/11.   Past Medical History  Obesity  Significant Hospital Events   9/15 off NO 9/17 brady event, hypothermic  Consults:   Procedures:  ETT 9/11 > CVC 9/11 >  A-line 9/11 >   Significant Diagnostic Tests:  9/5 CT abd/pelvis > mild right hydrouteronephrosis with stone in mid ureter, diffuse pulmonary parenchymal findings consistent with pneumonia, suspected gallstone without acute cholecystitis, prior chronic inflammatory changes in distal ileum and ascending colon.  9/7 Echo> LVEF 60-65%, RV size, function normal, valves OK 9/11 Echo> LVEF 70-75%, RV size, function is normal, valves OK  Micro Data:  8/26 SARS COV 2 > positive 9/2 blood >  9/11 resp >  9/11 blood >   Antimicrobials:  Ceftriaxone 9/2>> 9/6 Azithromycin 9/2>>  9/6 Cefepime 9/11>9/14 vanc 9/11>9/14 Eraxis 9/11>9/13  Interim history/subjective:   More hypertensive Opening eyes some Plateau 35, driving pressure 19 Wbc down some Afebrile   Objective   Blood pressure 98/64, pulse 81, temperature (!) 95.2 F (35.1 C), temperature source Axillary, resp. rate (!) 35, height 5\' 9"  (1.753 m), weight 110.8 kg, SpO2 96 %.    Vent Mode: PRVC FiO2 (%):  [75 %-90 %] 75 % Set Rate:  [35 bmp] 35 bmp Vt Set:  [390 mL-420 mL] 420 mL PEEP:  [16 cmH20] 16 cmH20 Plateau Pressure:  [26 cmH20-35 cmH20] 34 cmH20   Intake/Output Summary (Last 24 hours) at 06/01/2020 0729 Last data filed at 06/01/2020 0600 Gross per 24 hour  Intake 3870.52 ml  Output 4245 ml  Net -374.48 ml   Filed Weights   05/28/20 0500  05/29/20 0500 06/01/20 0500  Weight: 108.4 kg 108.3 kg 110.8 kg    Examination:  General:  In bed on vent HENT: NCAT ETT in place PULM: Crackles bases B, vent supported breathing CV: RRR, no mgr GI: BS+, soft, nontender MSK: normal bulk and tone Neuro: sedated on vent  9/19 CXR > severe bilateral airspace disease, ett in good position personally reviewed  Resolved Hospital Problem list     Assessment & Plan:  COVID 19, severe ARDS > oxygenation marginally improved over last 5-6 days Continue mechanical ventilation per ARDS protocol Target TVol 6-8cc/kgIBW Target Plateau Pressure < 30cm H20 Target driving pressure less than 15 cm of water Target PaO2 55-65: titrate PEEP/FiO2 per protocol As long as PaO2 to FiO2 ratio is less than 1:150 position in prone position for 16 hours a day Check CVP daily if CVL in place Target CVP less than 4, diurese as necessary Ventilator associated pneumonia prevention protocol 9/19 > prone again, lasix again, prednisone  Fever overnight (9/18-9/19), unclear source, most likely HCAP, less likely line infection resp culture Urinalysis Change line to PICC Start emp  Need for sedation for mechanical ventilation NMB intermittent RASS goal -5  Ketamine, fentanyl, versed  Bradycardia> due to precedex and hypothermia (exposure), resolved Tele Bear Hugger prn  Hypertension Add amlodipine Prn labetalol and hydralazine  Hyperglycemia SSI  AKI resolved Ureteral colic in setting of mid R ureter stone Monitor BMET and UOP Replace electrolytes as needed  Best practice:  Diet: tube feeding (replace cor track on Friday) Pain/Anxiety/Delirium protocol (if indicated): as above VAP protocol (if indicated): yes DVT prophylaxis: lovenox GI prophylaxis: Pantoprazole for stress ulcer prophylaxis Glucose control: SSI Mobility: bed rest Code Status: full Family Communication: I called his father Tirrell Buchberger on 9/18 around noon and  spoke to him, will try to call again Disposition: remain in ICU  Labs   CBC: Recent Labs  Lab 05/27/20 0507 05/27/20 1112 05/29/20 0445 05/29/20 2142 05/30/20 0358 05/30/20 1041 05/30/20 1350 05/31/20 0414 05/31/20 0858 06/01/20 0500 06/01/20 0616  WBC 19.7*  --  18.7*  --  16.3*  --   --  16.9*  --  16.5*  --   NEUTROABS  --   --   --   --  13.5*  --   --  14.0*  --  13.2*  --   HGB 12.9*   < > 13.7   < > 13.3   < > 15.3 13.6 13.3 13.0 13.3  HCT 44.0   < > 45.2   < > 45.1   < > 45.0 45.5 39.0 43.3 39.0  MCV 92.1  --  89.7  --  92.6  --   --  93.0  --  93.9  --   PLT 304  --  260  --  211  --   --  199  --  209  --    < > = values in this interval not displayed.    Basic Metabolic Panel: Recent Labs  Lab 05/26/20 0317 05/26/20 1216 05/27/20 0507 05/27/20 1112 05/28/20 0413 05/28/20 2151 05/29/20 0445 05/29/20 2142 05/30/20 0358 05/30/20 1041 05/30/20 1350 05/31/20 0414 05/31/20 0858 06/01/20 0500 06/01/20 0616  NA  --    < > 139   < > 138   < > 143   < > 141   < > 142 143 143 145 145  K  --    < > 4.8   < > 4.3   < > 4.1   < > 4.9   < > 4.8 3.7 3.6 3.7 3.6  CL  --    < > 92*  --  92*  --  96*  --  94*  --   --  92*  --  95*  --   CO2  --    < > 40*  --  38*  --  39*  --  39*  --   --  41*  --  42*  --   GLUCOSE  --    < > 186*  --  169*  --  185*  --  103*  --   --  187*  --  113*  --   BUN  --    < > 25*  --  25*  --  31*  --  31*  --   --  41*  --  35*  --   CREATININE  --    < > 0.91  --  0.83  --  0.80  --  0.87  --   --  1.02  --  0.73  --   CALCIUM  --    < > 8.1*  --  8.3*  --  8.2*  --  8.2*  --   --  8.5*  --  8.4*  --   MG 2.4  --  2.2  --  2.3  --   --   --   --   --   --   --   --   --   --  PHOS 3.5  --  4.1  --  3.4  --   --   --   --   --   --   --   --   --   --    < > = values in this interval not displayed.   GFR: Estimated Creatinine Clearance: 161.1 mL/min (by C-G formula based on SCr of 0.73 mg/dL). Recent Labs  Lab 05/29/20 0445  05/30/20 0358 05/31/20 0414 06/01/20 0500  WBC 18.7* 16.3* 16.9* 16.5*    Liver Function Tests: Recent Labs  Lab 05/30/20 0358 05/31/20 0414 06/01/20 0500  AST 25 28 18   ALT 54* 55* 42  ALKPHOS 64 80 74  BILITOT 0.4 0.5 0.7  PROT 5.3* 5.3* 5.3*  ALBUMIN 2.5* 2.5* 2.3*   No results for input(s): LIPASE, AMYLASE in the last 168 hours. No results for input(s): AMMONIA in the last 168 hours.  ABG    Component Value Date/Time   PHART 7.428 06/01/2020 0616   PCO2ART 74.3 (HH) 06/01/2020 0616   PO2ART 58 (L) 06/01/2020 0616   HCO3 50.0 (H) 06/01/2020 0616   TCO2 >50 (H) 06/01/2020 0616   O2SAT 91.0 06/01/2020 0616     Coagulation Profile: No results for input(s): INR, PROTIME in the last 168 hours.  Cardiac Enzymes: No results for input(s): CKTOTAL, CKMB, CKMBINDEX, TROPONINI in the last 168 hours.  HbA1C: Hgb A1c MFr Bld  Date/Time Value Ref Range Status  05/25/2020 10:24 AM 6.4 (H) 4.8 - 5.6 % Final    Comment:    (NOTE) Pre diabetes:          5.7%-6.4%  Diabetes:              >6.4%  Glycemic control for   <7.0% adults with diabetes     CBG: Recent Labs  Lab 05/31/20 1235 05/31/20 1612 05/31/20 1957 05/31/20 2335 06/01/20 0349  GLUCAP 217* 183* 162* 151* 130*       Critical care time: 32 minutes    06/03/20, MD [place] [facility] Pager: [phone] Cell: [phone] If no response, call [phone]

## 2020-06-01 NOTE — Progress Notes (Signed)
Pharmacy Antibiotic Note  Brian Mack is a 34 y.o. male admitted on 06/10/20 with pneumonia.  Pharmacy has been consulted for Vanc and Zosyn dosing. CrCL > 100 ml/min  Plan: Cefepime 2 gms IV q8hr Vanc 2 gms IV x 1, then 1 gm IV q8hr Monitor renal function, clinical status, C&S, and vanc levels as needed  Height: 5\' 9"  (175.3 cm) Weight: 110.8 kg (244 lb 4.3 oz) IBW/kg (Calculated) : 70.7  Temp (24hrs), Avg:97.3 F (36.3 C), Min:94.3 F (34.6 C), Max:101.6 F (38.7 C)  Recent Labs  Lab 05/27/20 0507 05/27/20 0507 05/28/20 0413 05/29/20 0445 05/30/20 0358 05/31/20 0414 06/01/20 0500  WBC 19.7*  --   --  18.7* 16.3* 16.9* 16.5*  CREATININE 0.91   < > 0.83 0.80 0.87 1.02 0.73   < > = values in this interval not displayed.    Estimated Creatinine Clearance: 161.1 mL/min (by C-G formula based on SCr of 0.73 mg/dL).    No Known Allergies  Antimicrobials this admission: Cefepime 9/19 >> Vanc 9/19 >>    Thank you for allowing pharmacy to be a part of this patient's care.  10/19, PharmD, [facility] Clinical Pharmacist Please see AMION for all Pharmacists' Contact Phone Numbers 06/01/2020, 3:12 PM

## 2020-06-01 NOTE — Progress Notes (Signed)
RT unable to obtain sputum culture. RN and MD aware.

## 2020-06-01 NOTE — Progress Notes (Signed)
Patient's head turned to the right. 

## 2020-06-01 NOTE — Progress Notes (Signed)
Carollee Herter RN aware that PICC line will not be placed today.  Has TL CVC working well at this time.

## 2020-06-01 NOTE — Progress Notes (Addendum)
Patient placed in prone position.  ETT secured with cloth tape at 27 cm at the lip.  Patient tolerated well with no complications.

## 2020-06-02 ENCOUNTER — Inpatient Hospital Stay (HOSPITAL_COMMUNITY): Payer: HRSA Program

## 2020-06-02 LAB — COMPREHENSIVE METABOLIC PANEL
ALT: 35 U/L (ref 0–44)
AST: 20 U/L (ref 15–41)
Albumin: 2.3 g/dL — ABNORMAL LOW (ref 3.5–5.0)
Alkaline Phosphatase: 80 U/L (ref 38–126)
Anion gap: 9 (ref 5–15)
BUN: 33 mg/dL — ABNORMAL HIGH (ref 6–20)
CO2: 41 mmol/L — ABNORMAL HIGH (ref 22–32)
Calcium: 8.2 mg/dL — ABNORMAL LOW (ref 8.9–10.3)
Chloride: 96 mmol/L — ABNORMAL LOW (ref 98–111)
Creatinine, Ser: 0.91 mg/dL (ref 0.61–1.24)
GFR calc Af Amer: >60 mL/min (ref >60)
GFR calc non Af Amer: >60 mL/min (ref >60)
Glucose, Bld: 149 mg/dL — ABNORMAL HIGH (ref 70–99)
Potassium: 4 mmol/L (ref 3.5–5.1)
Sodium: 146 mmol/L — ABNORMAL HIGH (ref 135–145)
Total Bilirubin: 0.8 mg/dL (ref 0.3–1.2)
Total Protein: 5.3 g/dL — ABNORMAL LOW (ref 6.5–8.1)

## 2020-06-02 LAB — POCT I-STAT 7, (LYTES, BLD GAS, ICA,H+H)
Acid-Base Excess: 12 mmol/L — ABNORMAL HIGH (ref 0.0–2.0)
Bicarbonate: 43.1 mmol/L — ABNORMAL HIGH (ref 20.0–28.0)
Calcium, Ion: 1.2 mmol/L (ref 1.15–1.40)
HCT: 39 % (ref 39.0–52.0)
Hemoglobin: 13.3 g/dL (ref 13.0–17.0)
O2 Saturation: 91 %
Potassium: 4.1 mmol/L (ref 3.5–5.1)
Sodium: 145 mmol/L (ref 135–145)
TCO2: 46 mmol/L — ABNORMAL HIGH (ref 22–32)
pCO2 arterial: 95.3 mmHg (ref 32.0–48.0)
pH, Arterial: 7.264 — ABNORMAL LOW (ref 7.350–7.450)
pO2, Arterial: 76 mmHg — ABNORMAL LOW (ref 83.0–108.0)

## 2020-06-02 LAB — CBC WITH DIFFERENTIAL/PLATELET
Abs Immature Granulocytes: 0.29 10*3/uL — ABNORMAL HIGH (ref 0.00–0.07)
Basophils Absolute: 0.1 10*3/uL (ref 0.0–0.1)
Basophils Relative: 0 %
Eosinophils Absolute: 0.2 10*3/uL (ref 0.0–0.5)
Eosinophils Relative: 1 %
HCT: 42.7 % (ref 39.0–52.0)
Hemoglobin: 12.3 g/dL — ABNORMAL LOW (ref 13.0–17.0)
Immature Granulocytes: 2 %
Lymphocytes Relative: 5 %
Lymphs Abs: 1 10*3/uL (ref 0.7–4.0)
MCH: 27.8 pg (ref 26.0–34.0)
MCHC: 28.8 g/dL — ABNORMAL LOW (ref 30.0–36.0)
MCV: 96.6 fL (ref 80.0–100.0)
Monocytes Absolute: 1.3 10*3/uL — ABNORMAL HIGH (ref 0.1–1.0)
Monocytes Relative: 7 %
Neutro Abs: 15.5 10*3/uL — ABNORMAL HIGH (ref 1.7–7.7)
Neutrophils Relative %: 85 %
Platelets: 185 10*3/uL (ref 150–400)
RBC: 4.42 MIL/uL (ref 4.22–5.81)
RDW: 13.6 % (ref 11.5–15.5)
WBC: 18.4 10*3/uL — ABNORMAL HIGH (ref 4.0–10.5)
nRBC: 0 % (ref 0.0–0.2)

## 2020-06-02 LAB — GLUCOSE, CAPILLARY
Glucose-Capillary: 114 mg/dL — ABNORMAL HIGH (ref 70–99)
Glucose-Capillary: 126 mg/dL — ABNORMAL HIGH (ref 70–99)
Glucose-Capillary: 129 mg/dL — ABNORMAL HIGH (ref 70–99)
Glucose-Capillary: 146 mg/dL — ABNORMAL HIGH (ref 70–99)
Glucose-Capillary: 176 mg/dL — ABNORMAL HIGH (ref 70–99)
Glucose-Capillary: 87 mg/dL (ref 70–99)

## 2020-06-02 MED ORDER — FREE WATER
200.0000 mL | Status: DC
  Administered 2020-06-02 – 2020-06-05 (×18): 200 mL

## 2020-06-02 MED ORDER — SODIUM CHLORIDE 0.9% FLUSH: 10.0000 mL | INTRAVENOUS | Status: DC | PRN

## 2020-06-02 MED ORDER — SODIUM CHLORIDE 0.9% FLUSH
10.0000 mL | Freq: Two times a day (BID) | INTRAVENOUS | Status: DC
  Administered 2020-06-03: 30 mL
  Administered 2020-06-03 – 2020-06-15 (×17): 10 mL

## 2020-06-02 MED ORDER — ACETAZOLAMIDE 250 MG PO TABS
250.0000 mg | ORAL_TABLET | Freq: Two times a day (BID) | ORAL | Status: DC
  Administered 2020-06-02 – 2020-06-03 (×4): 250 mg
  Filled 2020-06-02 (×5): qty 1

## 2020-06-02 NOTE — Progress Notes (Signed)
Pt supined at this time with no complications. No skin breakdown noted. Ett secured with commercial tube holder in proper position. ABG to be obtained in approximately 1 hr to assess need for re-prone 

## 2020-06-02 NOTE — Progress Notes (Signed)
Pt proned at this time with 4x RN and 2x RT without complication.

## 2020-06-02 NOTE — Progress Notes (Signed)
Patients head turned to the Left with no complications. Suction catheter passed without resistance.

## 2020-06-02 NOTE — Progress Notes (Signed)
NAME:  Brian Mack, MRN:  [id number], DOB:  [birth year], LOS: 18 ADMISSION DATE:  05-18-20, CONSULTATION DATE:  9/2 REFERRING MD:  Ophelia Charter, CHIEF COMPLAINT:  Dyspnea   Brief History   34 y/o male admitted on 9/2 with COVID pneumonia after testing positive on 8/26.  PCCM consulted for admission on 9/3, treated by [facility] on progressive care, then condition worsened by 9/9 and he was eventually intubated on 9/11.   Past Medical History  Obesity  Significant Hospital Events   9/15 off NO 9/17 brady event, hypothermic  Consults:   Procedures:  ETT 9/11 > CVC 9/11 >  A-line 9/11 >   Significant Diagnostic Tests:  9/5 CT abd/pelvis > mild right hydrouteronephrosis with stone in mid ureter, diffuse pulmonary parenchymal findings consistent with pneumonia, suspected gallstone without acute cholecystitis, prior chronic inflammatory changes in distal ileum and ascending colon.  9/7 Echo> LVEF 60-65%, RV size, function normal, valves OK 9/11 Echo> LVEF 70-75%, RV size, function is normal, valves OK  Micro Data:  8/26 SARS COV 2 > positive 9/2 blood > negative 9/11 resp > negative 9/11 blood > negative 9/19 respiratory culture >  Antimicrobials:  Ceftriaxone 9/2>> 9/6 Azithromycin 9/2>>  9/6 Cefepime 9/11>9/14 vanc 9/11>9/14 Eraxis 9/11>9/13  Interim history/subjective:  He was proned yesterday due to hypoxemia with P/F ratio was <150, now he will be supine after being prone for 16 hours  Objective   Blood pressure 123/71, pulse 71, temperature 97.7 F (36.5 C), temperature source Oral, resp. rate (!) 38, height 5\' 9"  (1.753 m), weight 104.4 kg, SpO2 96 %.    Vent Mode: PRVC FiO2 (%):  [60 %-75 %] 60 % Set Rate:  [35 bmp] 35 bmp Vt Set:  [370 mL-420 mL] 370 mL PEEP:  [16 cmH20] 16 cmH20 Plateau Pressure:  [24 cmH20-37 cmH20] 27 cmH20   Intake/Output Summary (Last 24 hours) at 06/02/2020 1148 Last data filed at 06/02/2020 1000 Gross per 24 hour  Intake 5109.84 ml  Output  5410 ml  Net -300.16 ml   Filed Weights   05/29/20 0500 06/01/20 0500 06/02/20 0615  Weight: 108.3 kg 110.8 kg 104.4 kg    Examination:   Physical exam: General: Acutely ill-appearing obese male, orally intubated, in prone position HEAENT: [place]/AT, eyes anicteric.  ETT and OGT in place Neuro: Sedated, not following commands.  Eyes are closed.  Pupils 3 mm bilateral reactive to light Chest: Coarse breath sounds, no wheezes or rhonchi Heart: Regular rate and rhythm, no murmurs or gallops Abdomen: Soft, nontender, nondistended, bowel sounds present Skin: No rash  Resolved Hospital Problem list   Precedex induced bradycardia AKI Assessment & Plan:  Acute hypoxic/hypercapnic respiratory failure due to ARDS from COVID-19 pneumonia  Continue mechanical ventilation per ARDS protocol Target TVol 6 cc/kgIBW Target Plateau Pressure < 30cm H20 and  driving pressure < 15 cm of water, has been achieved Target PaO2 55-80: titrate PEEP/FiO2 per protocol Patient was supine at 11:50 AM, will repeat ABG in an hour, if PaO2 to FiO2 ratio is<150, consider prone position for 16 hours a day Ventilator associated pneumonia prevention protocol Continue steroid Continue ketamine, Versed and fentanyl with RASS goal -4 to -5  Acute metabolic alkalosis due to aggressive diuresis and respiratory acidosis from hypercapnia  Started on acetazolamide to help compensate metabolic alkalosis, adjusted ventilatory setting to clear hypercapnia Monitor BMP and ABGs  Hypertension, controlled Continue amlodipine Prn labetalol and hydralazine  Prediabetes with hyperglycemia due to steroid Patient hemoglobin A1c 6.4 Continue sliding  scale insulin, while on steroid  Best practice:  Diet: tube feeding (replace cor track on Friday) Pain/Anxiety/Delirium protocol (if indicated): as above VAP protocol (if indicated): yes DVT prophylaxis: lovenox GI prophylaxis: Pantoprazole for stress ulcer prophylaxis Glucose  control: SSI Mobility: bed rest Code Status: full Family Communication: We will update family today Disposition: remain in ICU  Labs   CBC: Recent Labs  Lab 05/29/20 0445 05/29/20 2142 05/30/20 0358 05/30/20 1041 05/31/20 0414 05/31/20 0858 06/01/20 0500 06/01/20 0616 06/02/20 0556  WBC 18.7*  --  16.3*  --  16.9*  --  16.5*  --  18.4*  NEUTROABS  --   --  13.5*  --  14.0*  --  13.2*  --  15.5*  HGB 13.7   < > 13.3   < > 13.6 13.3 13.0 13.3 12.3*  HCT 45.2   < > 45.1   < > 45.5 39.0 43.3 39.0 42.7  MCV 89.7  --  92.6  --  93.0  --  93.9  --  96.6  PLT 260  --  211  --  199  --  209  --  185   < > = values in this interval not displayed.    Basic Metabolic Panel: Recent Labs  Lab 05/27/20 0507 05/27/20 1112 05/28/20 0413 05/28/20 2151 05/29/20 0445 05/29/20 2142 05/30/20 0358 05/30/20 1041 05/31/20 0414 05/31/20 0858 06/01/20 0500 06/01/20 0616 06/02/20 0556  NA 139   < > 138   < > 143   < > 141   < > 143 143 145 145 146*  K 4.8   < > 4.3   < > 4.1   < > 4.9   < > 3.7 3.6 3.7 3.6 4.0  CL 92*  --  92*  --  96*  --  94*  --  92*  --  95*  --  96*  CO2 40*  --  38*  --  39*  --  39*  --  41*  --  42*  --  41*  GLUCOSE 186*  --  169*  --  185*  --  103*  --  187*  --  113*  --  149*  BUN 25*  --  25*  --  31*  --  31*  --  41*  --  35*  --  33*  CREATININE 0.91  --  0.83  --  0.80  --  0.87  --  1.02  --  0.73  --  0.91  CALCIUM 8.1*  --  8.3*  --  8.2*  --  8.2*  --  8.5*  --  8.4*  --  8.2*  MG 2.2  --  2.3  --   --   --   --   --   --   --   --   --   --   PHOS 4.1  --  3.4  --   --   --   --   --   --   --   --   --   --    < > = values in this interval not displayed.   GFR: Estimated Creatinine Clearance: 137.5 mL/min (by C-G formula based on SCr of 0.91 mg/dL). Recent Labs  Lab 05/30/20 0358 05/31/20 0414 06/01/20 0500 06/02/20 0556  WBC 16.3* 16.9* 16.5* 18.4*    Liver Function Tests: Recent Labs  Lab 05/30/20 0358 05/31/20 0414 06/01/20 0500  06/02/20 6767  AST 25 28 18 20   ALT 54* 55* 42 35  ALKPHOS 64 80 74 80  BILITOT 0.4 0.5 0.7 0.8  PROT 5.3* 5.3* 5.3* 5.3*  ALBUMIN 2.5* 2.5* 2.3* 2.3*   No results for input(s): LIPASE, AMYLASE in the last 168 hours. No results for input(s): AMMONIA in the last 168 hours.  ABG    Component Value Date/Time   PHART 7.428 06/01/2020 0616   PCO2ART 74.3 (HH) 06/01/2020 0616   PO2ART 58 (L) 06/01/2020 0616   HCO3 50.0 (H) 06/01/2020 0616   TCO2 >50 (H) 06/01/2020 0616   O2SAT 91.0 06/01/2020 0616     Coagulation Profile: No results for input(s): INR, PROTIME in the last 168 hours.  Cardiac Enzymes: No results for input(s): CKTOTAL, CKMB, CKMBINDEX, TROPONINI in the last 168 hours.  HbA1C: Hgb A1c MFr Bld  Date/Time Value Ref Range Status  05/25/2020 10:24 AM 6.4 (H) 4.8 - 5.6 % Final    Comment:    (NOTE) Pre diabetes:          5.7%-6.4%  Diabetes:              >6.4%  Glycemic control for   <7.0% adults with diabetes     CBG: Recent Labs  Lab 06/01/20 1938 06/01/20 2336 06/02/20 0348 06/02/20 0813 06/02/20 1142  GLUCAP 146* 134* 114* 129* 126*      Total critical care time: 45 minutes  Performed by: 06/04/20   Critical care time was exclusive of separately billable procedures and treating other patients.   Critical care was necessary to treat or prevent imminent or life-threatening deterioration.   Critical care was time spent personally by me on the following activities: development of treatment plan with patient and/or surrogate as well as nursing, discussions with consultants, evaluation of patient's response to treatment, examination of patient, obtaining history from patient or surrogate, ordering and performing treatments and interventions, ordering and review of laboratory studies, ordering and review of radiographic studies, pulse oximetry and re-evaluation of patient's condition.   Cheri Fowler MD Critical care physician [facility] [place]  Critical Care  Pager: [phone] Mobile: [phone]

## 2020-06-02 NOTE — Progress Notes (Signed)
Peripherally Inserted Central Catheter Placement  The IV Nurse has discussed with the patient and/or persons authorized to consent for the patient, the purpose of this procedure and the potential benefits and risks involved with this procedure.  The benefits include less needle sticks, lab draws from the catheter, and the patient may be discharged home with the catheter. Risks include, but not limited to, infection, bleeding, blood clot (thrombus formation), and puncture of an artery; nerve damage and irregular heartbeat and possibility to perform a PICC exchange if needed/ordered by physician.  Alternatives to this procedure were also discussed.  Bard Power PICC patient education guide, fact sheet on infection prevention and patient information card has been provided to patient /or left at bedside.  Telephone consent obtained from father, Davaun Quintela, due to altered mental status.  PICC Placement Documentation  PICC Triple Lumen 06/02/20 PICC Left Cephalic 49 cm 3 cm (Active)  Indication for Insertion or Continuance of Line Prolonged intravenous therapies;Vasoactive infusions 06/02/20 1807  Exposed Catheter (cm) 3 cm 06/02/20 1807  Site Assessment Clean;Dry;Intact 06/02/20 1807  Lumen #1 Status Flushed;Saline locked;Blood return noted 06/02/20 1807  Lumen #2 Status Flushed;Saline locked;Blood return noted 06/02/20 1807  Lumen #3 Status Flushed;Saline locked;Blood return noted 06/02/20 1807  Dressing Type Transparent 06/02/20 1807  Dressing Status Clean;Dry;Intact 06/02/20 1807  Antimicrobial disc in place? Yes 06/02/20 1807  Safety Lock Not Applicable 06/02/20 1807  Line Care Connections checked and tightened 06/02/20 1807  Dressing Intervention New dressing 06/02/20 1807  Dressing Change Due 06/09/20 06/02/20 1807       Usher Hedberg, Lajean Manes 06/02/2020, 6:10 PM

## 2020-06-03 LAB — POCT I-STAT 7, (LYTES, BLD GAS, ICA,H+H)
Acid-Base Excess: 10 mmol/L — ABNORMAL HIGH (ref 0.0–2.0)
Acid-Base Excess: 9 mmol/L — ABNORMAL HIGH (ref 0.0–2.0)
Bicarbonate: 38.7 mmol/L — ABNORMAL HIGH (ref 20.0–28.0)
Bicarbonate: 39.9 mmol/L — ABNORMAL HIGH (ref 20.0–28.0)
Calcium, Ion: 1.25 mmol/L (ref 1.15–1.40)
Calcium, Ion: 1.24 mmol/L (ref 1.15–1.40)
HCT: 35 % — ABNORMAL LOW (ref 39.0–52.0)
HCT: 39 % (ref 39.0–52.0)
Hemoglobin: 11.9 g/dL — ABNORMAL LOW (ref 13.0–17.0)
Hemoglobin: 13.3 g/dL (ref 13.0–17.0)
O2 Saturation: 80 %
O2 Saturation: 95 %
Patient temperature: 98.6
Potassium: 4.6 mmol/L (ref 3.5–5.1)
Potassium: 4.9 mmol/L (ref 3.5–5.1)
Sodium: 145 mmol/L (ref 135–145)
Sodium: 144 mmol/L (ref 135–145)
TCO2: 41 mmol/L — ABNORMAL HIGH (ref 22–32)
TCO2: 43 mmol/L — ABNORMAL HIGH (ref 22–32)
pCO2 arterial: 75.5 mmHg (ref 32.0–48.0)
pCO2 arterial: 92.4 mmHg (ref 32.0–48.0)
pH, Arterial: 7.318 — ABNORMAL LOW (ref 7.350–7.450)
pH, Arterial: 7.243 — ABNORMAL LOW (ref 7.350–7.450)
pO2, Arterial: 51 mmHg — ABNORMAL LOW (ref 83.0–108.0)
pO2, Arterial: 92 mmHg (ref 83.0–108.0)

## 2020-06-03 LAB — BASIC METABOLIC PANEL
Anion gap: 7 (ref 5–15)
BUN: 29 mg/dL — ABNORMAL HIGH (ref 6–20)
CO2: 36 mmol/L — ABNORMAL HIGH (ref 22–32)
Calcium: 8.9 mg/dL (ref 8.9–10.3)
Chloride: 105 mmol/L (ref 98–111)
Creatinine, Ser: 0.9 mg/dL (ref 0.61–1.24)
GFR calc Af Amer: >60 mL/min (ref >60)
GFR calc non Af Amer: >60 mL/min (ref >60)
Glucose, Bld: 125 mg/dL — ABNORMAL HIGH (ref 70–99)
Potassium: 4.4 mmol/L (ref 3.5–5.1)
Sodium: 148 mmol/L — ABNORMAL HIGH (ref 135–145)

## 2020-06-03 LAB — GLUCOSE, CAPILLARY
Glucose-Capillary: 120 mg/dL — ABNORMAL HIGH (ref 70–99)
Glucose-Capillary: 147 mg/dL — ABNORMAL HIGH (ref 70–99)
Glucose-Capillary: 165 mg/dL — ABNORMAL HIGH (ref 70–99)
Glucose-Capillary: 178 mg/dL — ABNORMAL HIGH (ref 70–99)
Glucose-Capillary: 219 mg/dL — ABNORMAL HIGH (ref 70–99)
Glucose-Capillary: 89 mg/dL (ref 70–99)

## 2020-06-03 LAB — CBC
HCT: 40.6 % (ref 39.0–52.0)
Hemoglobin: 11.2 g/dL — ABNORMAL LOW (ref 13.0–17.0)
MCH: 27.3 pg (ref 26.0–34.0)
MCHC: 27.6 g/dL — ABNORMAL LOW (ref 30.0–36.0)
MCV: 98.8 fL (ref 80.0–100.0)
Platelets: 178 10*3/uL (ref 150–400)
RBC: 4.11 MIL/uL — ABNORMAL LOW (ref 4.22–5.81)
RDW: 14.1 % (ref 11.5–15.5)
WBC: 17.4 10*3/uL — ABNORMAL HIGH (ref 4.0–10.5)
nRBC: 0 % (ref 0.0–0.2)

## 2020-06-03 LAB — VANCOMYCIN, TROUGH: Vancomycin Tr: 27 ug/mL (ref 15–20)

## 2020-06-03 LAB — PHOSPHORUS: Phosphorus: 3.1 mg/dL (ref 2.5–4.6)

## 2020-06-03 LAB — MAGNESIUM: Magnesium: 2.1 mg/dL (ref 1.7–2.4)

## 2020-06-03 MED ORDER — LABETALOL HCL 5 MG/ML IV SOLN
10.0000 mg | INTRAVENOUS | Status: DC | PRN
  Administered 2020-06-03: 10 mg via INTRAVENOUS
  Filled 2020-06-03: qty 4

## 2020-06-03 MED ORDER — LABETALOL HCL 5 MG/ML IV SOLN
10.0000 mg | INTRAVENOUS | Status: DC | PRN
  Administered 2020-06-03 – 2020-06-04 (×5): 10 mg via INTRAVENOUS
  Filled 2020-06-03 (×6): qty 4

## 2020-06-03 MED ORDER — FUROSEMIDE 10 MG/ML IJ SOLN
40.0000 mg | Freq: Once | INTRAMUSCULAR | Status: AC
  Administered 2020-06-03: 40 mg via INTRAVENOUS
  Filled 2020-06-03: qty 4

## 2020-06-03 NOTE — Progress Notes (Signed)
Patients head turned to the Right.  

## 2020-06-03 NOTE — Progress Notes (Signed)
Pt supined at this time with no complications. No skin breakdown noted. Ett secured with commercial tube holder in proper position. ABG to be obtained in approximately 1 hr to assess need for re-prone 

## 2020-06-03 NOTE — Progress Notes (Signed)
NAME:  Brian Mack, MRN:  [id number], DOB:  04/18/1986, LOS: 19 ADMISSION DATE:  06/09/2020, CONSULTATION DATE:  9/2 REFERRING MD:  Ophelia Charter, CHIEF COMPLAINT:  Dyspnea   Brief History   34 y/o male admitted on 9/2 with COVID pneumonia after testing positive on 8/26.  PCCM consulted for admission on 9/3, treated by [facility] on progressive care, then condition worsened by 9/9 and he was eventually intubated on 9/11.   Past Medical History  Obesity  Significant Hospital Events   9/15 off NO 9/17 brady event, hypothermic  Consults:   Procedures:  ETT 9/11 > CVC 9/11 >  A-line 9/11 >   Significant Diagnostic Tests:  9/5 CT abd/pelvis > mild right hydrouteronephrosis with stone in mid ureter, diffuse pulmonary parenchymal findings consistent with pneumonia, suspected gallstone without acute cholecystitis, prior chronic inflammatory changes in distal ileum and ascending colon.  9/7 Echo> LVEF 60-65%, RV size, function normal, valves OK 9/11 Echo> LVEF 70-75%, RV size, function is normal, valves OK  Micro Data:  8/26 SARS COV 2 > positive 9/2 blood > negative 9/11 resp > negative 9/11 blood > negative 9/19 respiratory culture >  Antimicrobials:  Ceftriaxone 9/2>> 9/6 Azithromycin 9/2>>  9/6 Cefepime 9/11>9/14 vanc 9/11>9/14 Eraxis 9/11>9/13  Interim history/subjective:  Patient was proned again last night due to hypoxemia and P/F ratio less than 150.  Added intermittent neuromuscular blockade as he was dyssynchronous with the vent  Objective   Blood pressure 132/76, pulse 96, temperature 99.2 F (37.3 C), temperature source Axillary, resp. rate (!) 0, height 5\' 9"  (1.753 m), weight 104.4 kg, SpO2 91 %.    Vent Mode: PRVC FiO2 (%):  [50 %-80 %] 70 % Set Rate:  [35 bmp] 35 bmp Vt Set:  [370 mL] 370 mL PEEP:  [16 cmH20] 16 cmH20 Plateau Pressure:  [29 cmH20-30 cmH20] 30 cmH20   Intake/Output Summary (Last 24 hours) at 06/03/2020 1118 Last data filed at 06/03/2020 1100 Gross  per 24 hour  Intake 4556.76 ml  Output 3465 ml  Net 1091.76 ml   Filed Weights   05/29/20 0500 06/01/20 0500 06/02/20 0615  Weight: 108.3 kg 110.8 kg 104.4 kg    Examination:   Physical exam: General: Acutely ill-appearing obese male, orally intubated, in prone position HEAENT: [place]/AT, eyes anicteric.  ETT and OGT in place Neuro: Sedated, paralyzed, not following commands.  Eyes are closed.  Pupils 3 mm bilateral reactive to light Chest: Fine crackles at the bases bilaterally, no wheezes or rhonchi Heart: Regular rate and rhythm, no murmurs or gallops Abdomen: Soft, nontender, nondistended, bowel sounds present Skin: No rash  Resolved Hospital Problem list   Precedex induced bradycardia AKI Assessment & Plan:  Acute hypoxic/hypercapnic respiratory failure due to ARDS from COVID-19 pneumonia  Continue mechanical ventilation per ARDS protocol Target TVol 6 cc/kgIBW Target Plateau Pressure < 30cm H20 and  driving pressure < 15 cm of water, has been achieved Target PaO2 55-80: titrate PEEP/FiO2 per protocol Patient was proned yesterday again as his ABG showed P/F less than 150, he will be supine this afternoon, will reassess to see if patient needs to be reproned again  Ventilator associated pneumonia prevention protocol Continue steroid Continue ketamine, Versed and fentanyl with RASS goal -4 to -5  Acute metabolic alkalosis due to aggressive diuresis and respiratory acidosis from hypercapnia Patient serum bicarbonate has trended down from 41-36, continue acetazolamide for 2 more doses and reevaluate with a BMP and ABGs, as PCO2 is still elevated into 70s but  better than yesterday of 95 Monitor BMP and ABGs  Hypertension, urgency Patient blood pressure went up to 240 systolic, he received 10 mg of IV hydralazine Is started on labetalol 10 mg every 4 hour as needed for systolic blood pressure > 180 Continue amlodipine   Prediabetes with hyperglycemia due to steroid Patient  hemoglobin A1c 6.4 Continue sliding scale insulin, while on steroid  Hypernatremia Patient serum sodium trended up to 148 Continue free water via G-tube  Best practice:  Diet: tube feeding Pain/Anxiety/Delirium protocol (if indicated): as above VAP protocol (if indicated): yes DVT prophylaxis: lovenox GI prophylaxis: Pantoprazole for stress ulcer prophylaxis Glucose control: SSI Mobility: bed rest Code Status: full Family Communication: We will update family today Disposition: remain in ICU  Labs   CBC: Recent Labs  Lab 05/30/20 0358 05/30/20 1041 05/31/20 0414 05/31/20 0858 06/01/20 0500 06/01/20 0500 06/01/20 0616 06/02/20 0556 06/02/20 1343 06/03/20 0331 06/03/20 0655  WBC 16.3*  --  16.9*  --  16.5*  --   --  18.4*  --   --  17.4*  NEUTROABS 13.5*  --  14.0*  --  13.2*  --   --  15.5*  --   --   --   HGB 13.3   < > 13.6   < > 13.0   < > 13.3 12.3* 13.3 11.9* 11.2*  HCT 45.1   < > 45.5   < > 43.3   < > 39.0 42.7 39.0 35.0* 40.6  MCV 92.6  --  93.0  --  93.9  --   --  96.6  --   --  98.8  PLT 211  --  199  --  209  --   --  185  --   --  178   < > = values in this interval not displayed.    Basic Metabolic Panel: Recent Labs  Lab 05/28/20 0413 05/28/20 2151 05/30/20 0358 05/30/20 1041 05/31/20 0414 05/31/20 0858 06/01/20 0500 06/01/20 0500 06/01/20 0616 06/02/20 0556 06/02/20 1343 06/03/20 0331 06/03/20 0655  NA 138   < > 141   < > 143   < > 145   < > 145 146* 145 145 148*  K 4.3   < > 4.9   < > 3.7   < > 3.7   < > 3.6 4.0 4.1 4.6 4.4  CL 92*   < > 94*  --  92*  --  95*  --   --  96*  --   --  105  CO2 38*   < > 39*  --  41*  --  42*  --   --  41*  --   --  36*  GLUCOSE 169*   < > 103*  --  187*  --  113*  --   --  149*  --   --  125*  BUN 25*   < > 31*  --  41*  --  35*  --   --  33*  --   --  29*  CREATININE 0.83   < > 0.87  --  1.02  --  0.73  --   --  0.91  --   --  0.90  CALCIUM 8.3*   < > 8.2*  --  8.5*  --  8.4*  --   --  8.2*  --   --  8.9    MG 2.3  --   --   --   --   --   --   --   --   --   --   --  2.1  PHOS 3.4  --   --   --   --   --   --   --   --   --   --   --  3.1   < > = values in this interval not displayed.   GFR: Estimated Creatinine Clearance: 139 mL/min (by C-G formula based on SCr of 0.9 mg/dL). Recent Labs  Lab 05/31/20 0414 06/01/20 0500 06/02/20 0556 06/03/20 0655  WBC 16.9* 16.5* 18.4* 17.4*    Liver Function Tests: Recent Labs  Lab 05/30/20 0358 05/31/20 0414 06/01/20 0500 06/02/20 0556  AST 25 28 18 20   ALT 54* 55* 42 35  ALKPHOS 64 80 74 80  BILITOT 0.4 0.5 0.7 0.8  PROT 5.3* 5.3* 5.3* 5.3*  ALBUMIN 2.5* 2.5* 2.3* 2.3*   No results for input(s): LIPASE, AMYLASE in the last 168 hours. No results for input(s): AMMONIA in the last 168 hours.  ABG    Component Value Date/Time   PHART 7.318 (L) 06/03/2020 0331   PCO2ART 75.5 (HH) 06/03/2020 0331   PO2ART 51 (L) 06/03/2020 0331   HCO3 38.7 (H) 06/03/2020 0331   TCO2 41 (H) 06/03/2020 0331   O2SAT 80.0 06/03/2020 0331     Coagulation Profile: No results for input(s): INR, PROTIME in the last 168 hours.  Cardiac Enzymes: No results for input(s): CKTOTAL, CKMB, CKMBINDEX, TROPONINI in the last 168 hours.  HbA1C: Hgb A1c MFr Bld  Date/Time Value Ref Range Status  05/25/2020 10:24 AM 6.4 (H) 4.8 - 5.6 % Final    Comment:    (NOTE) Pre diabetes:          5.7%-6.4%  Diabetes:              >6.4%  Glycemic control for   <7.0% adults with diabetes     CBG: Recent Labs  Lab 06/02/20 1528 06/02/20 1953 06/02/20 2345 06/03/20 0334 06/03/20 0819  GLUCAP 176* 146* 87 89 120*      Total critical care time: 38 minutes  Performed by: 06/05/20   Critical care time was exclusive of separately billable procedures and treating other patients.   Critical care was necessary to treat or prevent imminent or life-threatening deterioration.   Critical care was time spent personally by me on the following activities:  development of treatment plan with patient and/or surrogate as well as nursing, discussions with consultants, evaluation of patient's response to treatment, examination of patient, obtaining history from patient or surrogate, ordering and performing treatments and interventions, ordering and review of laboratory studies, ordering and review of radiographic studies, pulse oximetry and re-evaluation of patient's condition.   Cheri Fowler MD Critical care physician [facility] [place] [facility]  Pager: [phone] Mobile: [phone]

## 2020-06-03 NOTE — Progress Notes (Signed)
Pt proned at 2215 with RNx4 and RTx2. No complications or skin breakdown noted. Suctioned mouth and airway.

## 2020-06-04 LAB — BLOOD GAS, ARTERIAL
Acid-Base Excess: 11.7 mmol/L — ABNORMAL HIGH (ref 0.0–2.0)
Acid-Base Excess: 7.5 mmol/L — ABNORMAL HIGH (ref 0.0–2.0)
Acid-Base Excess: 7.5 mmol/L — ABNORMAL HIGH (ref 0.0–2.0)
Bicarbonate: 35.5 mmol/L — ABNORMAL HIGH (ref 20.0–28.0)
Bicarbonate: 36.1 mmol/L — ABNORMAL HIGH (ref 20.0–28.0)
Bicarbonate: 39.3 mmol/L — ABNORMAL HIGH (ref 20.0–28.0)
Drawn by: 28603
FIO2: 100
FIO2: 100
FIO2: 70
O2 Saturation: 93.2 %
O2 Saturation: 95.8 %
O2 Saturation: 96.5 %
Patient temperature: 36.7
Patient temperature: 37.1
Patient temperature: 37.7
pCO2 arterial: 100 mmHg (ref 32.0–48.0)
pCO2 arterial: 108 mmHg (ref 32.0–48.0)
pCO2 arterial: 96.1 mmHg (ref 32.0–48.0)
pH, Arterial: 7.152 — CL (ref 7.350–7.450)
pH, Arterial: 7.179 — CL (ref 7.350–7.450)
pH, Arterial: 7.235 — ABNORMAL LOW (ref 7.350–7.450)
pO2, Arterial: 69.9 mmHg — ABNORMAL LOW (ref 83.0–108.0)
pO2, Arterial: 84.9 mmHg (ref 83.0–108.0)
pO2, Arterial: 92.2 mmHg (ref 83.0–108.0)

## 2020-06-04 LAB — POCT I-STAT 7, (LYTES, BLD GAS, ICA,H+H)
Acid-Base Excess: 14 mmol/L — ABNORMAL HIGH (ref 0.0–2.0)
Bicarbonate: 44 mmol/L — ABNORMAL HIGH (ref 20.0–28.0)
Calcium, Ion: 1.22 mmol/L (ref 1.15–1.40)
HCT: 35 % — ABNORMAL LOW (ref 39.0–52.0)
Hemoglobin: 11.9 g/dL — ABNORMAL LOW (ref 13.0–17.0)
O2 Saturation: 96 %
Patient temperature: 98
Potassium: 4.2 mmol/L (ref 3.5–5.1)
Sodium: 145 mmol/L (ref 135–145)
TCO2: 47 mmol/L — ABNORMAL HIGH (ref 22–32)
pCO2 arterial: 83.6 mmHg (ref 32.0–48.0)
pH, Arterial: 7.327 — ABNORMAL LOW (ref 7.350–7.450)
pO2, Arterial: 97 mmHg (ref 83.0–108.0)

## 2020-06-04 LAB — GLUCOSE, CAPILLARY
Glucose-Capillary: 128 mg/dL — ABNORMAL HIGH (ref 70–99)
Glucose-Capillary: 151 mg/dL — ABNORMAL HIGH (ref 70–99)
Glucose-Capillary: 153 mg/dL — ABNORMAL HIGH (ref 70–99)
Glucose-Capillary: 153 mg/dL — ABNORMAL HIGH (ref 70–99)
Glucose-Capillary: 186 mg/dL — ABNORMAL HIGH (ref 70–99)
Glucose-Capillary: 191 mg/dL — ABNORMAL HIGH (ref 70–99)

## 2020-06-04 MED ORDER — SODIUM CHLORIDE 0.9 % IV SOLN
4.0000 mg/h | INTRAVENOUS | Status: DC
  Administered 2020-06-04 – 2020-06-10 (×13): 4 mg/h via INTRAVENOUS
  Filled 2020-06-04 (×21): qty 5

## 2020-06-04 MED ORDER — FUROSEMIDE 10 MG/ML IJ SOLN
40.0000 mg | Freq: Once | INTRAMUSCULAR | Status: AC
  Administered 2020-06-04: 40 mg via INTRAVENOUS
  Filled 2020-06-04: qty 4

## 2020-06-04 MED ORDER — VECURONIUM BROMIDE 10 MG IV SOLR
0.8000 ug/kg/min | INTRAVENOUS | Status: DC
  Administered 2020-06-04: 1 ug/kg/min via INTRAVENOUS
  Filled 2020-06-04 (×3): qty 100

## 2020-06-04 MED ORDER — AMLODIPINE BESYLATE 10 MG PO TABS
10.0000 mg | ORAL_TABLET | Freq: Every day | ORAL | Status: DC
  Administered 2020-06-04 – 2020-06-10 (×7): 10 mg
  Filled 2020-06-04 (×8): qty 1

## 2020-06-04 MED ORDER — PREDNISONE 20 MG PO TABS
30.0000 mg | ORAL_TABLET | Freq: Every day | ORAL | Status: DC
  Administered 2020-06-05 – 2020-06-09 (×5): 30 mg
  Filled 2020-06-04 (×5): qty 1

## 2020-06-04 NOTE — Progress Notes (Signed)
Turned pt head to the right. suctioned mouth and airway. No complications noted.

## 2020-06-04 NOTE — Progress Notes (Signed)
eLink Physician-Brief Progress Note Patient Name: Brian Mack DOB: 02-Aug-1986 MRN: [id number]   Date of Service  06/04/2020  HPI/Events of Note  Notified of ABG result 7.152/108/84.9 On AC 35/370/100%/14 PEEP Peak pressure 30, plateau pressure 27, I:E 1:1.4 No change in driving pressure when PEEP was decreased to 14 Proned, sedated and paralyzed  eICU Interventions  Increase TV to 400 (<6 cc kg IBW) Discussed with RT     Intervention Category Major Interventions: Respiratory failure - evaluation and management  Darl Pikes 06/04/2020, 5:55 AM

## 2020-06-04 NOTE — Progress Notes (Signed)
eLink Physician-Brief Progress Note Patient Name: Mahamed Zalewski DOB: 1985-10-09 MRN: [id number]   Date of Service  06/04/2020  HPI/Events of Note  Notified of ABG result 1 hour post pronation. Given vecuronium push prior to proning ABG 7.18/100/92, Peak pressure 25-35 with vent desynchrony On 35/370/70%/16 PEEP  eICU Interventions  Ordered continuous NMB Decrease PEEP to 14 Discussed with bedside RN ABG after an hour     Intervention Category Major Interventions: Respiratory failure - evaluation and management  Rosalie Gums Indica Marcott 06/04/2020, 1:22 AM

## 2020-06-04 NOTE — Progress Notes (Signed)
NAME:  Brian Mack, MRN:  [id number], DOB:  08/01/1986, LOS: 20 ADMISSION DATE:  Jun 12, 2020, CONSULTATION DATE:  9/2 REFERRING MD:  Ophelia Charter, CHIEF COMPLAINT:  Dyspnea   Brief History   34 y/o male admitted on 9/2 with COVID pneumonia after testing positive on 8/26.  PCCM consulted for admission on 9/3, treated by [facility] on progressive care, then condition worsened by 9/9 and he was eventually intubated on 9/11.   Past Medical History  Obesity  Significant Hospital Events   9/15 off NO 9/17 brady event, hypothermic  Consults:   Procedures:  ETT 9/11 > CVC 9/11 >  A-line 9/11 >   Significant Diagnostic Tests:  9/5 CT abd/pelvis > mild right hydrouteronephrosis with stone in mid ureter, diffuse pulmonary parenchymal findings consistent with pneumonia, suspected gallstone without acute cholecystitis, prior chronic inflammatory changes in distal ileum and ascending colon.  9/7 Echo> LVEF 60-65%, RV size, function normal, valves OK 9/11 Echo> LVEF 70-75%, RV size, function is normal, valves OK  Micro Data:  8/26 SARS COV 2 > positive 9/2 blood > negative 9/11 resp > negative 9/11 blood > negative 9/19 respiratory culture >  Antimicrobials:  Ceftriaxone 9/2>> 9/6 Azithromycin 9/2>>  9/6 Cefepime 9/11>9/14 vanc 9/11>9/14 Eraxis 9/11>9/13  Interim history/subjective:  Patient was proned again last night due to hypoxemia.repeat ABGs afterwards shows worsening acute respiratory acidosis and hypercapnia, he was started on continuous neuromuscular blockade   Objective   Blood pressure (!) 120/58, pulse (!) 104, temperature 98.9 F (37.2 C), temperature source Oral, resp. rate (!) 37, height 5\' 9"  (1.753 m), weight 107.7 kg, SpO2 (!) 89 %.    Vent Mode: PRVC FiO2 (%):  [70 %-100 %] 100 % Set Rate:  [35 bmp] 35 bmp Vt Set:  [370 mL-420 mL] 420 mL PEEP:  [14 cmH20-16 cmH20] 14 cmH20 Plateau Pressure:  [27 cmH20-32 cmH20] 29 cmH20   Intake/Output Summary (Last 24 hours) at  06/04/2020 1253 Last data filed at 06/04/2020 1222 Gross per 24 hour  Intake 4845.05 ml  Output 3450 ml  Net 1395.05 ml   Filed Weights   06/01/20 0500 06/02/20 0615 06/04/20 0319  Weight: 110.8 kg 104.4 kg 107.7 kg    Examination:   Physical exam: General: Acutely ill-appearing obese male, orally intubated, in prone position HEAENT: [place]/AT, eyes anicteric.  ETT and OGT in place Neuro: Sedated, paralyzed, not following commands.  Eyes are closed.  Pupils 3 mm bilateral reactive to light Chest: Mechanical breath sounds all over, no wheezes or rhonchi Heart: Regular rate and rhythm, no murmurs or gallops Abdomen: Soft, nontender, nondistended, bowel sounds present Skin: No rash  Resolved Hospital Problem list   Precedex induced bradycardia AKI Assessment & Plan:  Acute hypoxic/hypercapnic respiratory failure due to ARDS from COVID-19 pneumonia  Acute respiratory acidosis  Looks like patient is in fibroproliferative stage of ARDS, his ventilation has affected which is evidenced by increasing PCO2 and respiratory acidosis I made changes to the ventilatory setting, with slight improvement in pH and PCO2 but is still it remains over 90 Continue mechanical ventilation per ARDS protocol Target TVol 6 cc/kgIBW Target Plateau Pressure < 30cm H20 and  driving pressure < 15 cm of water, has been achieved Target PaO2 55-80: titrate PEEP/FiO2 per protocol He is a scheduled to be supine at 2 PM today Ventilator associated pneumonia prevention protocol Steroid were started on tapering dose Continue ketamine, Versed and fentanyl with RASS goal -4 to -5  Acute metabolic alkalosis, improving Patient metabolic alkalosis  has improved with acetazolamide Discontinue Diamox Monitor BMP and ABGs  Hypertension, urgency Patient blood pressure went up to 240 systolic, he received 10 mg of IV hydralazine Is started on labetalol 10 mg every 4 hour as needed for systolic blood pressure > 180 Increase  amlodipine to 10 mg daily   Prediabetes with hyperglycemia due to steroid Patient hemoglobin A1c 6.4 Continue sliding scale insulin, while on steroid  Hypernatremia Patient serum sodium improved, down from 1 48-1 44 Continue free water via G-tube  Best practice:  Diet: tube feeding Pain/Anxiety/Delirium protocol (if indicated): as above VAP protocol (if indicated): yes DVT prophylaxis: lovenox GI prophylaxis: Pantoprazole for stress ulcer prophylaxis Glucose control: SSI Mobility: bed rest Code Status: full Family Communication: Patient's father was updated over the phone Disposition: remain in ICU  Labs   CBC: Recent Labs  Lab 05/30/20 0358 05/30/20 1041 05/31/20 0414 05/31/20 0858 06/01/20 0500 06/01/20 0616 06/02/20 0556 06/02/20 1343 06/03/20 0331 06/03/20 0655 06/03/20 1537  WBC 16.3*  --  16.9*  --  16.5*  --  18.4*  --   --  17.4*  --   NEUTROABS 13.5*  --  14.0*  --  13.2*  --  15.5*  --   --   --   --   HGB 13.3   < > 13.6   < > 13.0   < > 12.3* 13.3 11.9* 11.2* 13.3  HCT 45.1   < > 45.5   < > 43.3   < > 42.7 39.0 35.0* 40.6 39.0  MCV 92.6  --  93.0  --  93.9  --  96.6  --   --  98.8  --   PLT 211  --  199  --  209  --  185  --   --  178  --    < > = values in this interval not displayed.    Basic Metabolic Panel: Recent Labs  Lab 05/30/20 0358 05/30/20 1041 05/31/20 0414 05/31/20 0858 06/01/20 0500 06/01/20 0616 06/02/20 0556 06/02/20 1343 06/03/20 0331 06/03/20 0655 06/03/20 1537  NA 141   < > 143   < > 145   < > 146* 145 145 148* 144  K 4.9   < > 3.7   < > 3.7   < > 4.0 4.1 4.6 4.4 4.9  CL 94*  --  92*  --  95*  --  96*  --   --  105  --   CO2 39*  --  41*  --  42*  --  41*  --   --  36*  --   GLUCOSE 103*  --  187*  --  113*  --  149*  --   --  125*  --   BUN 31*  --  41*  --  35*  --  33*  --   --  29*  --   CREATININE 0.87  --  1.02  --  0.73  --  0.91  --   --  0.90  --   CALCIUM 8.2*  --  8.5*  --  8.4*  --  8.2*  --   --  8.9  --     MG  --   --   --   --   --   --   --   --   --  2.1  --   PHOS  --   --   --   --   --   --   --   --   --  3.1  --    < > = values in this interval not displayed.   GFR: Estimated Creatinine Clearance: 141.2 mL/min (by C-G formula based on SCr of 0.9 mg/dL). Recent Labs  Lab 05/31/20 0414 06/01/20 0500 06/02/20 0556 06/03/20 0655  WBC 16.9* 16.5* 18.4* 17.4*    Liver Function Tests: Recent Labs  Lab 05/30/20 0358 05/31/20 0414 06/01/20 0500 06/02/20 0556  AST 25 28 18 20   ALT 54* 55* 42 35  ALKPHOS 64 80 74 80  BILITOT 0.4 0.5 0.7 0.8  PROT 5.3* 5.3* 5.3* 5.3*  ALBUMIN 2.5* 2.5* 2.3* 2.3*   No results for input(s): LIPASE, AMYLASE in the last 168 hours. No results for input(s): AMMONIA in the last 168 hours.  ABG    Component Value Date/Time   PHART 7.235 (L) 06/04/2020 1013   PCO2ART 96.1 (HH) 06/04/2020 1013   PO2ART 69.9 (L) 06/04/2020 1013   HCO3 39.3 (H) 06/04/2020 1013   TCO2 43 (H) 06/03/2020 1537   O2SAT 93.2 06/04/2020 1013     Coagulation Profile: No results for input(s): INR, PROTIME in the last 168 hours.  Cardiac Enzymes: No results for input(s): CKTOTAL, CKMB, CKMBINDEX, TROPONINI in the last 168 hours.  HbA1C: Hgb A1c MFr Bld  Date/Time Value Ref Range Status  05/25/2020 10:24 AM 6.4 (H) 4.8 - 5.6 % Final    Comment:    (NOTE) Pre diabetes:          5.7%-6.4%  Diabetes:              >6.4%  Glycemic control for   <7.0% adults with diabetes     CBG: Recent Labs  Lab 06/03/20 1956 06/03/20 2333 06/04/20 0318 06/04/20 0830 06/04/20 1135  GLUCAP 178* 165* 153* 153* 186*      Total critical care time: 33 minutes  Performed by: 06/06/20   Critical care time was exclusive of separately billable procedures and treating other patients.   Critical care was necessary to treat or prevent imminent or life-threatening deterioration.   Critical care was time spent personally by me on the following activities: development of  treatment plan with patient and/or surrogate as well as nursing, discussions with consultants, evaluation of patient's response to treatment, examination of patient, obtaining history from patient or surrogate, ordering and performing treatments and interventions, ordering and review of laboratory studies, ordering and review of radiographic studies, pulse oximetry and re-evaluation of patient's condition.   Cheri Fowler MD Critical care physician [facility] [place] [facility]  Pager: [phone] Mobile: [phone]

## 2020-06-04 NOTE — Progress Notes (Signed)
Pt returned to the supine position with RT x2 and RN x3. Cloth tape removed, small cut to the upper lip area noted. Hollister tube holder placed on pt and ETT resecured at 26cm. RT will continue to monitor.

## 2020-06-05 LAB — POCT I-STAT 7, (LYTES, BLD GAS, ICA,H+H)
Acid-Base Excess: 14 mmol/L — ABNORMAL HIGH (ref 0.0–2.0)
Acid-Base Excess: 15 mmol/L — ABNORMAL HIGH (ref 0.0–2.0)
Bicarbonate: 42.4 mmol/L — ABNORMAL HIGH (ref 20.0–28.0)
Bicarbonate: 43.6 mmol/L — ABNORMAL HIGH (ref 20.0–28.0)
Calcium, Ion: 1.24 mmol/L (ref 1.15–1.40)
Calcium, Ion: 1.17 mmol/L (ref 1.15–1.40)
HCT: 35 % — ABNORMAL LOW (ref 39.0–52.0)
HCT: 34 % — ABNORMAL LOW (ref 39.0–52.0)
Hemoglobin: 11.9 g/dL — ABNORMAL LOW (ref 13.0–17.0)
Hemoglobin: 11.6 g/dL — ABNORMAL LOW (ref 13.0–17.0)
O2 Saturation: 90 %
O2 Saturation: 88 %
Patient temperature: 99.4
Potassium: 3.8 mmol/L (ref 3.5–5.1)
Potassium: 5.3 mmol/L — ABNORMAL HIGH (ref 3.5–5.1)
Sodium: 148 mmol/L — ABNORMAL HIGH (ref 135–145)
Sodium: 143 mmol/L (ref 135–145)
TCO2: 45 mmol/L — ABNORMAL HIGH (ref 22–32)
TCO2: 46 mmol/L — ABNORMAL HIGH (ref 22–32)
pCO2 arterial: 73.2 mmHg (ref 32.0–48.0)
pCO2 arterial: 73.2 mmHg (ref 32.0–48.0)
pH, Arterial: 7.372 (ref 7.350–7.450)
pH, Arterial: 7.383 (ref 7.350–7.450)
pO2, Arterial: 66 mmHg — ABNORMAL LOW (ref 83.0–108.0)
pO2, Arterial: 59 mmHg — ABNORMAL LOW (ref 83.0–108.0)

## 2020-06-05 LAB — GLUCOSE, CAPILLARY
Glucose-Capillary: 159 mg/dL — ABNORMAL HIGH (ref 70–99)
Glucose-Capillary: 159 mg/dL — ABNORMAL HIGH (ref 70–99)
Glucose-Capillary: 185 mg/dL — ABNORMAL HIGH (ref 70–99)
Glucose-Capillary: 166 mg/dL — ABNORMAL HIGH (ref 70–99)
Glucose-Capillary: 137 mg/dL — ABNORMAL HIGH (ref 70–99)
Glucose-Capillary: 132 mg/dL — ABNORMAL HIGH (ref 70–99)

## 2020-06-05 MED ORDER — FUROSEMIDE 10 MG/ML IJ SOLN
60.0000 mg | Freq: Once | INTRAMUSCULAR | Status: AC
  Administered 2020-06-05: 60 mg via INTRAVENOUS
  Filled 2020-06-05: qty 6

## 2020-06-05 MED ORDER — CLONIDINE HCL 0.1 MG PO TABS
0.1000 mg | ORAL_TABLET | Freq: Two times a day (BID) | ORAL | Status: DC
  Administered 2020-06-05 (×2): 0.1 mg
  Filled 2020-06-05 (×2): qty 1

## 2020-06-05 MED ORDER — FREE WATER
300.0000 mL | Status: DC
  Administered 2020-06-05 – 2020-06-07 (×13): 300 mL

## 2020-06-05 MED ORDER — ACETAZOLAMIDE 250 MG PO TABS
250.0000 mg | ORAL_TABLET | Freq: Two times a day (BID) | ORAL | Status: DC
  Administered 2020-06-05 – 2020-06-10 (×11): 250 mg
  Filled 2020-06-05 (×12): qty 1

## 2020-06-05 MED ORDER — VITAL 1.5 CAL PO LIQD
1000.0000 mL | ORAL | Status: DC
  Administered 2020-06-05 – 2020-06-13 (×7): 1000 mL
  Filled 2020-06-05 (×5): qty 1000

## 2020-06-05 MED ORDER — FUROSEMIDE 10 MG/ML IJ SOLN: 40.0000 mg | Freq: Once | INTRAMUSCULAR | Status: DC

## 2020-06-05 NOTE — Progress Notes (Signed)
Nutrition Follow-up  DOCUMENTATION CODES:   Obesity unspecified  INTERVENTION:   Tube Feeding via Cortrak: Vital 1.5 at 65 ml/hr Pro-Source TF 90 mL TID Provides 2580 kcals, 171 g of protein, 1094 mL of free water Meets 100% estimaed calorie and protein needs  Total free water with TF and current free water flushes: 2894 mL   NUTRITION DIAGNOSIS:   Inadequate oral intake related to acute illness, poor appetite as evidenced by meal completion < 25%, NPO status.  Being addressed via TF   GOAL:   Patient will meet greater than or equal to 90% of their needs  Progressing  MONITOR:   Labs, Weight trends, TF tolerance, Vent status, Skin  REASON FOR ASSESSMENT:   Consult, Ventilator Enteral/tube feeding initiation and management  ASSESSMENT:   34 yo unvaccinated male admitted with severe COVID-19 pneumonia on 9/2 with progressive decline requiring intubation on 9/1 with severe ARDS, worsening AKI. No PMH  9/02 Admitted 9/11 Intubated 9/16 OG becaome dislodged while in prone positio, unable to replace 9/17 Hypothermic, Cortrak placed  Pt remains on vent support; Proned again last night. Heavily sedated on hydromorphone, versed, and ketatmine.   Tolerating Vital 1.5 at 60 ml/hr, Pro-Source 90 mL TID, free water 300 mL q 4 hours via Cortrak tube  No pressure injuries per RN skin assessment  Current wt 105.9 kg; admit weight 115.8 kg. Lowest weight 104.4 kg. Net +2L  Labs: potassium 5.3 (H), sodium 143 (wdl), CBGs 137-185 (ICU goal 140-180) Meds: prednisone, vecronium pushes, ss novolog  Diet Order:   Diet Order            Diet NPO time specified  Diet effective now                 EDUCATION NEEDS:   Not appropriate for education at this time  Skin:  Skin Assessment: Skin Integrity Issues: Skin Integrity Issues:: Other (Comment) Other: MASD  Last BM:  9/23 rectal tube  Height:   Ht Readings from Last 1 Encounters:  05/31/20 5\' 9"  (1.753 m)     Weight:   Wt Readings from Last 1 Encounters:  06/05/20 105.9 kg    Ideal Body Weight:   (IBW: 72.7 kg, Adjusted BW: 89.9 kg)  BMI:  Body mass index is 34.48 kg/m.  Estimated Nutritional Needs:   Kcal:  2500-2700 kcals  Protein:  140-180 g  Fluid:  >/= 2 L    06/07/20 MS, RDN, LDN, CNSC Registered Dietitian III Clinical Nutrition RD Pager and On-Call Pager Number Located in [place]

## 2020-06-05 NOTE — Progress Notes (Signed)
Pt proned with RNx5 and RTx2. Suctioned mouth and airway. No complications noted.

## 2020-06-05 NOTE — Progress Notes (Signed)
RN aware of order to remove CVC and leave PICC in.

## 2020-06-05 NOTE — Progress Notes (Signed)
Turned pt head to the right RNx1 and RTx2. No complications noted

## 2020-06-05 NOTE — Progress Notes (Addendum)
NAME:  Brian Mack, MRN:  [id number], DOB:  [birth year], LOS: 21 ADMISSION DATE:  06/07/2020, CONSULTATION DATE:  9/2 REFERRING MD:  Ophelia Charter, CHIEF COMPLAINT:  Dyspnea   Brief History   34 y/o male admitted on 9/2 with COVID pneumonia after testing positive on 8/26.  PCCM consulted for admission on 9/3, treated by [facility] on progressive care, then condition worsened by 9/9 and he was eventually intubated on 9/11.   Past Medical History  Obesity  Significant Hospital Events   9/15 off NO 9/17 brady event, hypothermic  Consults:   Procedures:  ETT 9/11 > CVC 9/11 >  A-line 9/11 >   Significant Diagnostic Tests:  9/5 CT abd/pelvis > mild right hydrouteronephrosis with stone in mid ureter, diffuse pulmonary parenchymal findings consistent with pneumonia, suspected gallstone without acute cholecystitis, prior chronic inflammatory changes in distal ileum and ascending colon.  9/7 Echo> LVEF 60-65%, RV size, function normal, valves OK 9/11 Echo> LVEF 70-75%, RV size, function is normal, valves OK  Micro Data:  8/26 SARS COV 2 > positive 9/2 blood > negative 9/11 resp > negative 9/11 blood > negative 9/19 respiratory culture >  Antimicrobials:  Ceftriaxone 9/2>> 9/6 Azithromycin 9/2>>  9/6 Cefepime 9/11>9/14 vanc 9/11>9/14 Eraxis 9/11>9/13  Interim history/subjective:  Patient was proned again last night due to hypoxemia and low P/F ratio. Currently hypertensive, clonidine was added  Objective   Blood pressure (!) 146/77, pulse 95, temperature (!) 97.5 F (36.4 C), temperature source Axillary, resp. rate (!) 0, height 5\' 9"  (1.753 m), weight 105.9 kg, SpO2 100 %.    Vent Mode: PRVC FiO2 (%):  [80 %-100 %] 80 % Set Rate:  [35 bmp] 35 bmp Vt Set:  [420 mL] 420 mL PEEP:  [14 cmH20] 14 cmH20 Plateau Pressure:  [25 cmH20-31 cmH20] 26 cmH20   Intake/Output Summary (Last 24 hours) at 06/05/2020 1103 Last data filed at 06/05/2020 0800 Gross per 24 hour  Intake 4167.66 ml    Output 3850 ml  Net 317.66 ml   Filed Weights   06/02/20 0615 06/04/20 0319 06/05/20 0459  Weight: 104.4 kg 107.7 kg 105.9 kg    Examination:   Physical exam: General: Acutely ill-appearing obese male, orally intubated, in prone position HEAENT: [place]/AT, eyes anicteric.  ETT and OGT in place Neuro: Sedated, paralyzed, not following commands.  Eyes are closed.  Pupils 3 mm bilateral reactive to light Chest: Crackles heard at the bases bilaterally, no wheezes or rhonchi Heart: Regular rate and rhythm, no murmurs or gallops Abdomen: Soft, nontender, nondistended, bowel sounds present Skin: No rash  Resolved Hospital Problem list   Precedex induced bradycardia AKI Assessment & Plan:  Acute hypoxic/hypercapnic respiratory failure due to ARDS from COVID-19 pneumonia  Acute respiratory acidosis  I adjusted ventilatory setting yesterday with that patient's pH has improved to 7.37 and PCO2 has decreased to 70s Continue mechanical ventilation per ARDS protocol Target TVol 6 cc/kgIBW Target Plateau Pressure < 30cm H20 and  driving pressure < 15 cm of water, has been achieved Target PaO2 55-80: titrate PEEP/FiO2 per protocol Will be supine later today, will continue to see if he needs to be reproned again Ventilator associated pneumonia prevention protocol Continue tapered dose steroid Continue ketamine, Versed and fentanyl with RASS goal -4 to -5 We will give him Lasix today  Acute metabolic alkalosis and acute respiratory acidosis Restarted on Diamox Monitor BMP and ABGs  Hypertension, urgency Patient blood pressure is slightly better controlled compared to yesterday Continue amlodipine 10 mg  unsteady Started on clonidine 0.1 mg twice daily Continue labetalol as needed   Prediabetes with hyperglycemia due to steroid Patient hemoglobin A1c 6.4 Continue sliding scale insulin, while on steroid  Hypernatremia Increase free water via G-tube Monitor serum sodium  Best  practice:  Diet: tube feeding Pain/Anxiety/Delirium protocol (if indicated): as above VAP protocol (if indicated): yes DVT prophylaxis: lovenox GI prophylaxis: Pantoprazole for stress ulcer prophylaxis Glucose control: SSI Mobility: bed rest Code Status: full Family Communication: I will update patient's family over the phone Disposition: remain in ICU  Labs   CBC: Recent Labs  Lab 05/30/20 0358 05/30/20 1041 05/31/20 0414 05/31/20 0858 06/01/20 0500 06/01/20 0616 06/02/20 0556 06/02/20 1343 06/03/20 0331 06/03/20 0655 06/03/20 1537 06/04/20 2046 06/05/20 0303  WBC 16.3*  --  16.9*  --  16.5*  --  18.4*  --   --  17.4*  --   --   --   NEUTROABS 13.5*  --  14.0*  --  13.2*  --  15.5*  --   --   --   --   --   --   HGB 13.3   < > 13.6   < > 13.0   < > 12.3*   < > 11.9* 11.2* 13.3 11.9* 11.9*  HCT 45.1   < > 45.5   < > 43.3   < > 42.7   < > 35.0* 40.6 39.0 35.0* 35.0*  MCV 92.6  --  93.0  --  93.9  --  96.6  --   --  98.8  --   --   --   PLT 211  --  199  --  209  --  185  --   --  178  --   --   --    < > = values in this interval not displayed.    Basic Metabolic Panel: Recent Labs  Lab 05/30/20 0358 05/30/20 1041 05/31/20 0414 05/31/20 0858 06/01/20 0500 06/01/20 3295 06/02/20 0556 06/02/20 1343 06/03/20 0331 06/03/20 0655 06/03/20 1537 06/04/20 2046 06/05/20 0303  NA 141   < > 143   < > 145   < > 146*   < > 145 148* 144 145 148*  K 4.9   < > 3.7   < > 3.7   < > 4.0   < > 4.6 4.4 4.9 4.2 3.8  CL 94*  --  92*  --  95*  --  96*  --   --  105  --   --   --   CO2 39*  --  41*  --  42*  --  41*  --   --  36*  --   --   --   GLUCOSE 103*  --  187*  --  113*  --  149*  --   --  125*  --   --   --   BUN 31*  --  41*  --  35*  --  33*  --   --  29*  --   --   --   CREATININE 0.87  --  1.02  --  0.73  --  0.91  --   --  0.90  --   --   --   CALCIUM 8.2*  --  8.5*  --  8.4*  --  8.2*  --   --  8.9  --   --   --   MG  --   --   --   --   --   --   --   --   --  2.1   --   --   --   PHOS  --   --   --   --   --   --   --   --   --  3.1  --   --   --    < > = values in this interval not displayed.   GFR: Estimated Creatinine Clearance: 140 mL/min (by C-G formula based on SCr of 0.9 mg/dL). Recent Labs  Lab 05/31/20 0414 06/01/20 0500 06/02/20 0556 06/03/20 0655  WBC 16.9* 16.5* 18.4* 17.4*    Liver Function Tests: Recent Labs  Lab 05/30/20 0358 05/31/20 0414 06/01/20 0500 06/02/20 0556  AST 25 28 18 20   ALT 54* 55* 42 35  ALKPHOS 64 80 74 80  BILITOT 0.4 0.5 0.7 0.8  PROT 5.3* 5.3* 5.3* 5.3*  ALBUMIN 2.5* 2.5* 2.3* 2.3*   No results for input(s): LIPASE, AMYLASE in the last 168 hours. No results for input(s): AMMONIA in the last 168 hours.  ABG    Component Value Date/Time   PHART 7.372 06/05/2020 0303   PCO2ART 73.2 (HH) 06/05/2020 0303   PO2ART 66 (L) 06/05/2020 0303   HCO3 42.4 (H) 06/05/2020 0303   TCO2 45 (H) 06/05/2020 0303   O2SAT 90.0 06/05/2020 0303     Coagulation Profile: No results for input(s): INR, PROTIME in the last 168 hours.  Cardiac Enzymes: No results for input(s): CKTOTAL, CKMB, CKMBINDEX, TROPONINI in the last 168 hours.  HbA1C: Hgb A1c MFr Bld  Date/Time Value Ref Range Status  05/25/2020 10:24 AM 6.4 (H) 4.8 - 5.6 % Final    Comment:    (NOTE) Pre diabetes:          5.7%-6.4%  Diabetes:              >6.4%  Glycemic control for   <7.0% adults with diabetes     CBG: Recent Labs  Lab 06/04/20 1528 06/04/20 1956 06/04/20 2350 06/05/20 0315 06/05/20 0804  GLUCAP 191* 128* 151* 159* 159*      Total critical care time: 34 minutes  Performed by: 06/07/20   Critical care time was exclusive of separately billable procedures and treating other patients.   Critical care was necessary to treat or prevent imminent or life-threatening deterioration.   Critical care was time spent personally by me on the following activities: development of treatment plan with patient and/or surrogate  as well as nursing, discussions with consultants, evaluation of patient's response to treatment, examination of patient, obtaining history from patient or surrogate, ordering and performing treatments and interventions, ordering and review of laboratory studies, ordering and review of radiographic studies, pulse oximetry and re-evaluation of patient's condition.   Cheri Fowler MD Critical care physician [facility]  Critical Care  Pager: [phone] Mobile: [phone]

## 2020-06-06 LAB — GLUCOSE, CAPILLARY
Glucose-Capillary: 128 mg/dL — ABNORMAL HIGH (ref 70–99)
Glucose-Capillary: 147 mg/dL — ABNORMAL HIGH (ref 70–99)
Glucose-Capillary: 159 mg/dL — ABNORMAL HIGH (ref 70–99)
Glucose-Capillary: 184 mg/dL — ABNORMAL HIGH (ref 70–99)
Glucose-Capillary: 188 mg/dL — ABNORMAL HIGH (ref 70–99)
Glucose-Capillary: 193 mg/dL — ABNORMAL HIGH (ref 70–99)

## 2020-06-06 LAB — POCT I-STAT 7, (LYTES, BLD GAS, ICA,H+H)
Acid-Base Excess: 12 mmol/L — ABNORMAL HIGH (ref 0.0–2.0)
Acid-Base Excess: 10 mmol/L — ABNORMAL HIGH (ref 0.0–2.0)
Bicarbonate: 39.8 mmol/L — ABNORMAL HIGH (ref 20.0–28.0)
Bicarbonate: 40 mmol/L — ABNORMAL HIGH (ref 20.0–28.0)
Calcium, Ion: 1.2 mmol/L (ref 1.15–1.40)
Calcium, Ion: 1.25 mmol/L (ref 1.15–1.40)
HCT: 32 % — ABNORMAL LOW (ref 39.0–52.0)
HCT: 34 % — ABNORMAL LOW (ref 39.0–52.0)
Hemoglobin: 10.9 g/dL — ABNORMAL LOW (ref 13.0–17.0)
Hemoglobin: 11.6 g/dL — ABNORMAL LOW (ref 13.0–17.0)
O2 Saturation: 85 %
O2 Saturation: 90 %
Patient temperature: 98.6
Potassium: 3.3 mmol/L — ABNORMAL LOW (ref 3.5–5.1)
Potassium: 4.4 mmol/L (ref 3.5–5.1)
Sodium: 147 mmol/L — ABNORMAL HIGH (ref 135–145)
Sodium: 146 mmol/L — ABNORMAL HIGH (ref 135–145)
TCO2: 42 mmol/L — ABNORMAL HIGH (ref 22–32)
TCO2: 43 mmol/L — ABNORMAL HIGH (ref 22–32)
pCO2 arterial: 69.4 mmHg (ref 32.0–48.0)
pCO2 arterial: 86.8 mmHg (ref 32.0–48.0)
pH, Arterial: 7.366 (ref 7.350–7.450)
pH, Arterial: 7.272 — ABNORMAL LOW (ref 7.350–7.450)
pO2, Arterial: 54 mmHg — ABNORMAL LOW (ref 83.0–108.0)
pO2, Arterial: 71 mmHg — ABNORMAL LOW (ref 83.0–108.0)

## 2020-06-06 LAB — CBC
HCT: 35.8 % — ABNORMAL LOW (ref 39.0–52.0)
Hemoglobin: 10.1 g/dL — ABNORMAL LOW (ref 13.0–17.0)
MCH: 27.2 pg (ref 26.0–34.0)
MCHC: 28.2 g/dL — ABNORMAL LOW (ref 30.0–36.0)
MCV: 96.5 fL (ref 80.0–100.0)
Platelets: 136 10*3/uL — ABNORMAL LOW (ref 150–400)
RBC: 3.71 MIL/uL — ABNORMAL LOW (ref 4.22–5.81)
RDW: 14.6 % (ref 11.5–15.5)
WBC: 13 10*3/uL — ABNORMAL HIGH (ref 4.0–10.5)
nRBC: 0 % (ref 0.0–0.2)

## 2020-06-06 LAB — COMPREHENSIVE METABOLIC PANEL
ALT: 34 U/L (ref 0–44)
AST: 22 U/L (ref 15–41)
Albumin: 2.1 g/dL — ABNORMAL LOW (ref 3.5–5.0)
Alkaline Phosphatase: 91 U/L (ref 38–126)
Anion gap: 9 (ref 5–15)
BUN: 32 mg/dL — ABNORMAL HIGH (ref 6–20)
CO2: 35 mmol/L — ABNORMAL HIGH (ref 22–32)
Calcium: 8.1 mg/dL — ABNORMAL LOW (ref 8.9–10.3)
Chloride: 105 mmol/L (ref 98–111)
Creatinine, Ser: 0.71 mg/dL (ref 0.61–1.24)
GFR calc Af Amer: >60 mL/min (ref >60)
GFR calc non Af Amer: >60 mL/min (ref >60)
Glucose, Bld: 165 mg/dL — ABNORMAL HIGH (ref 70–99)
Potassium: 3 mmol/L — ABNORMAL LOW (ref 3.5–5.1)
Sodium: 149 mmol/L — ABNORMAL HIGH (ref 135–145)
Total Bilirubin: 0.3 mg/dL (ref 0.3–1.2)
Total Protein: 4.9 g/dL — ABNORMAL LOW (ref 6.5–8.1)

## 2020-06-06 LAB — PHOSPHORUS: Phosphorus: 2.1 mg/dL — ABNORMAL LOW (ref 2.5–4.6)

## 2020-06-06 LAB — MAGNESIUM: Magnesium: 2.2 mg/dL (ref 1.7–2.4)

## 2020-06-06 MED ORDER — POTASSIUM PHOSPHATES 15 MMOLE/5ML IV SOLN
15.0000 mmol | Freq: Once | INTRAVENOUS | Status: AC
  Administered 2020-06-06: 15 mmol via INTRAVENOUS
  Filled 2020-06-06: qty 5

## 2020-06-06 MED ORDER — POTASSIUM CHLORIDE 20 MEQ/15ML (10%) PO SOLN
20.0000 meq | Freq: Once | ORAL | Status: AC
  Administered 2020-06-06: 20 meq
  Filled 2020-06-06: qty 15

## 2020-06-06 MED ORDER — CLONIDINE HCL 0.2 MG PO TABS
0.2000 mg | ORAL_TABLET | Freq: Two times a day (BID) | ORAL | Status: DC
  Administered 2020-06-06 – 2020-06-09 (×7): 0.2 mg
  Filled 2020-06-06 (×7): qty 1

## 2020-06-06 MED ORDER — POTASSIUM CHLORIDE 10 MEQ/50ML IV SOLN
10.0000 meq | INTRAVENOUS | Status: AC
  Administered 2020-06-06 (×3): 10 meq via INTRAVENOUS
  Filled 2020-06-06 (×3): qty 50

## 2020-06-06 MED ORDER — FUROSEMIDE 10 MG/ML IJ SOLN
60.0000 mg | Freq: Once | INTRAMUSCULAR | Status: AC
  Administered 2020-06-06: 60 mg via INTRAVENOUS
  Filled 2020-06-06: qty 6

## 2020-06-06 MED ORDER — POTASSIUM CHLORIDE 10 MEQ/50ML IV SOLN
10.0000 meq | INTRAVENOUS | Status: AC
  Administered 2020-06-06 (×2): 10 meq via INTRAVENOUS
  Filled 2020-06-06 (×2): qty 50

## 2020-06-06 NOTE — Progress Notes (Signed)
Pt proned without any complications. 

## 2020-06-06 NOTE — Progress Notes (Signed)
Pt supinated. ETT secured with commercial tube holder. No complications.

## 2020-06-06 NOTE — Progress Notes (Signed)
NAME:  Brian Mack, MRN:  [id number], DOB:  [birth year], LOS: 22 ADMISSION DATE:  05/17/2020, CONSULTATION DATE:  9/2 REFERRING MD:  Ophelia Charter, CHIEF COMPLAINT:  Dyspnea   Brief History   34 y/o male admitted on 9/2 with COVID pneumonia after testing positive on 8/26.  PCCM consulted for admission on 9/3, treated by [facility] on progressive care, then condition worsened by 9/9 and he was eventually intubated on 9/11.   Past Medical History  Obesity  Significant Hospital Events   9/15 off NO 9/17 brady event, hypothermic  Consults:   Procedures:  ETT 9/11 > CVC 9/11 >  A-line 9/11 >   Significant Diagnostic Tests:  9/5 CT abd/pelvis > mild right hydrouteronephrosis with stone in mid ureter, diffuse pulmonary parenchymal findings consistent with pneumonia, suspected gallstone without acute cholecystitis, prior chronic inflammatory changes in distal ileum and ascending colon.  9/7 Echo> LVEF 60-65%, RV size, function normal, valves OK 9/11 Echo> LVEF 70-75%, RV size, function is normal, valves OK  Micro Data:  8/26 SARS COV 2 > positive 9/2 blood > negative 9/11 resp > negative 9/11 blood > negative 9/19 respiratory culture >  Antimicrobials:  Ceftriaxone 9/2>> 9/6 Azithromycin 9/2>>  9/6 Cefepime 9/11>9/14 vanc 9/11>9/14 Eraxis 9/11>9/13  Interim history/subjective:  Patient was proned again last night due to hypoxemia and low P/F ratio. Patient is still hypertensive, clonidine dose was increased. Developed morbilliform rash on the back and arms  Objective   Blood pressure 120/62, pulse (!) 102, temperature 98.7 F (37.1 C), temperature source Oral, resp. rate (!) 0, height 5\' 9"  (1.753 m), weight 108 kg, SpO2 93 %.    Vent Mode: PRVC FiO2 (%):  [70 %] 70 % Set Rate:  [35 bmp] 35 bmp Vt Set:  [420 mL] 420 mL PEEP:  [12 cmH20] 12 cmH20 Plateau Pressure:  [18 cmH20-31 cmH20] 31 cmH20   Intake/Output Summary (Last 24 hours) at 06/06/2020 1307 Last data filed at  06/06/2020 1100 Gross per 24 hour  Intake 3632.15 ml  Output 4375 ml  Net -742.85 ml   Filed Weights   06/04/20 0319 06/05/20 0459 06/06/20 0417  Weight: 107.7 kg 105.9 kg 108 kg    Examination:   Physical exam: General: Acutely ill-appearing obese male, orally intubated, in prone position HEAENT: [place]/AT, eyes anicteric.  ETT and OGT in place Neuro: Sedated, not following commands.  Eyes are closed.  Pupils 3 mm bilateral reactive to light Chest: Crackles heard at the bases bilaterally left more than right, no wheezes or rhonchi Heart: Regular rate and rhythm, no murmurs or gallops Abdomen: Soft, nontender, nondistended, bowel sounds present Skin: No rash  Resolved Hospital Problem list   Precedex induced bradycardia AKI Assessment & Plan:  Acute hypoxic/hypercapnic respiratory failure due to ARDS from COVID-19 pneumonia  Acute respiratory acidosis  After ventilatory settings were adjusted, patient's respiratory acidosis is improving, currently pH is 7.36 with PCO2 69 Continue mechanical ventilation per ARDS protocol Target TVol 6 cc/kgIBW Today patient's peak and plateau pressures were slightly elevated in mid 30s, I came down on PEEP of with improvement and plateau pressure of 31 Target PaO2 55-80: titrate PEEP/FiO2 per protocol Will be supine later today, will continue to see if he needs to be reproned again Ventilator associated pneumonia prevention protocol Continue tapered dose steroid Continue ketamine, Versed and fentanyl with RASS goal -4 to -5 He is a still intermittently requiring neuromuscular blockade We will give him 1 dose of IV Lasix today, goal is net -1  to 2 L  Acute metabolic alkalosis and acute respiratory acidosis Patient's mixed acid-base disorder is improving Continue acetazolamide for now Monitor BMP and ABGs  Hypertension, urgency Patient's blood pressure is still elevated but better now Continue amlodipine 10 mg unsteady Increase clonidine to  0.2 mg twice daily Continue IV labetalol as needed   Prediabetes with hyperglycemia due to steroid Patient hemoglobin A1c 6.4 Continue sliding scale insulin, while on steroid  Hypernatremia Increase free water via G-tube Monitor serum sodium  Hypokalemia/hypophosphatemia Electrolytes are being supplemented aggressively Continue to monitor  Best practice:  Diet: tube feeding Pain/Anxiety/Delirium protocol (if indicated): as above VAP protocol (if indicated): yes DVT prophylaxis: lovenox GI prophylaxis: Pantoprazole for stress ulcer prophylaxis Glucose control: SSI Mobility: bed rest Code Status: full Family Communication: I will update patient's family over the phone Disposition: remain in ICU  Labs   CBC: Recent Labs  Lab 05/31/20 0414 05/31/20 0858 06/01/20 0500 06/01/20 0616 06/02/20 0556 06/02/20 1343 06/03/20 0655 06/03/20 1537 06/04/20 2046 06/05/20 0303 06/05/20 1822 06/06/20 0335 06/06/20 0400  WBC 16.9*  --  16.5*  --  18.4*  --  17.4*  --   --   --   --   --  13.0*  NEUTROABS 14.0*  --  13.2*  --  15.5*  --   --   --   --   --   --   --   --   HGB 13.6   < > 13.0   < > 12.3*   < > 11.2*   < > 11.9* 11.9* 11.6* 10.9* 10.1*  HCT 45.5   < > 43.3   < > 42.7   < > 40.6   < > 35.0* 35.0* 34.0* 32.0* 35.8*  MCV 93.0  --  93.9  --  96.6  --  98.8  --   --   --   --   --  96.5  PLT 199  --  209  --  185  --  178  --   --   --   --   --  136*   < > = values in this interval not displayed.    Basic Metabolic Panel: Recent Labs  Lab 05/31/20 0414 05/31/20 0858 06/01/20 0500 06/01/20 0616 06/02/20 0556 06/02/20 1343 06/03/20 0655 06/03/20 1537 06/04/20 2046 06/05/20 0303 06/05/20 1822 06/06/20 0335 06/06/20 0400  NA 143   < > 145   < > 146*   < > 148*   < > 145 148* 143 147* 149*  K 3.7   < > 3.7   < > 4.0   < > 4.4   < > 4.2 3.8 5.3* 3.3* 3.0*  CL 92*  --  95*  --  96*  --  105  --   --   --   --   --  105  CO2 41*  --  42*  --  41*  --  36*  --    --   --   --   --  35*  GLUCOSE 187*  --  113*  --  149*  --  125*  --   --   --   --   --  165*  BUN 41*  --  35*  --  33*  --  29*  --   --   --   --   --  32*  CREATININE 1.02  --  0.73  --  0.91  --  0.90  --   --   --   --   --  0.71  CALCIUM 8.5*  --  8.4*  --  8.2*  --  8.9  --   --   --   --   --  8.1*  MG  --   --   --   --   --   --  2.1  --   --   --   --   --  2.2  PHOS  --   --   --   --   --   --  3.1  --   --   --   --   --  2.1*   < > = values in this interval not displayed.   GFR: Estimated Creatinine Clearance: 159 mL/min (by C-G formula based on SCr of 0.71 mg/dL). Recent Labs  Lab 06/01/20 0500 06/02/20 0556 06/03/20 0655 06/06/20 0400  WBC 16.5* 18.4* 17.4* 13.0*    Liver Function Tests: Recent Labs  Lab 05/31/20 0414 06/01/20 0500 06/02/20 0556 06/06/20 0400  AST 28 18 20 22   ALT 55* 42 35 34  ALKPHOS 80 74 80 91  BILITOT 0.5 0.7 0.8 0.3  PROT 5.3* 5.3* 5.3* 4.9*  ALBUMIN 2.5* 2.3* 2.3* 2.1*   No results for input(s): LIPASE, AMYLASE in the last 168 hours. No results for input(s): AMMONIA in the last 168 hours.  ABG    Component Value Date/Time   PHART 7.366 06/06/2020 0335   PCO2ART 69.4 (HH) 06/06/2020 0335   PO2ART 54 (L) 06/06/2020 0335   HCO3 39.8 (H) 06/06/2020 0335   TCO2 42 (H) 06/06/2020 0335   O2SAT 85.0 06/06/2020 0335     Coagulation Profile: No results for input(s): INR, PROTIME in the last 168 hours.  Cardiac Enzymes: No results for input(s): CKTOTAL, CKMB, CKMBINDEX, TROPONINI in the last 168 hours.  HbA1C: Hgb A1c MFr Bld  Date/Time Value Ref Range Status  05/25/2020 10:24 AM 6.4 (H) 4.8 - 5.6 % Final    Comment:    (NOTE) Pre diabetes:          5.7%-6.4%  Diabetes:              >6.4%  Glycemic control for   <7.0% adults with diabetes     CBG: Recent Labs  Lab 06/05/20 1951 06/05/20 2321 06/06/20 0348 06/06/20 0832 06/06/20 1206  GLUCAP 137* 132* 159* 184* 193*      Total critical care time: 33  minutes  Performed by: 06/08/20   Critical care time was exclusive of separately billable procedures and treating other patients.   Critical care was necessary to treat or prevent imminent or life-threatening deterioration.   Critical care was time spent personally by me on the following activities: development of treatment plan with patient and/or surrogate as well as nursing, discussions with consultants, evaluation of patient's response to treatment, examination of patient, obtaining history from patient or surrogate, ordering and performing treatments and interventions, ordering and review of laboratory studies, ordering and review of radiographic studies, pulse oximetry and re-evaluation of patient's condition.   Cheri Fowler MD Critical care physician [facility]  Critical Care  Pager: [phone] Mobile: [phone]

## 2020-06-06 NOTE — Progress Notes (Signed)
K 3.0, Phos 2.1 Electrolytes replaced per protocol

## 2020-06-06 NOTE — Progress Notes (Signed)
One hour post supine arterial blood gas obtained.

## 2020-06-07 LAB — POCT I-STAT 7, (LYTES, BLD GAS, ICA,H+H)
Acid-Base Excess: 7 mmol/L — ABNORMAL HIGH (ref 0.0–2.0)
Bicarbonate: 33 mmol/L — ABNORMAL HIGH (ref 20.0–28.0)
Calcium, Ion: 1.15 mmol/L (ref 1.15–1.40)
HCT: 31 % — ABNORMAL LOW (ref 39.0–52.0)
Hemoglobin: 10.5 g/dL — ABNORMAL LOW (ref 13.0–17.0)
O2 Saturation: 91 %
Patient temperature: 98.7
Potassium: 4.6 mmol/L (ref 3.5–5.1)
Sodium: 146 mmol/L — ABNORMAL HIGH (ref 135–145)
TCO2: 35 mmol/L — ABNORMAL HIGH (ref 22–32)
pCO2 arterial: 54.5 mmHg — ABNORMAL HIGH (ref 32.0–48.0)
pH, Arterial: 7.39 (ref 7.350–7.450)
pO2, Arterial: 62 mmHg — ABNORMAL LOW (ref 83.0–108.0)

## 2020-06-07 LAB — GLUCOSE, CAPILLARY
Glucose-Capillary: 128 mg/dL — ABNORMAL HIGH (ref 70–99)
Glucose-Capillary: 130 mg/dL — ABNORMAL HIGH (ref 70–99)
Glucose-Capillary: 133 mg/dL — ABNORMAL HIGH (ref 70–99)
Glucose-Capillary: 135 mg/dL — ABNORMAL HIGH (ref 70–99)
Glucose-Capillary: 137 mg/dL — ABNORMAL HIGH (ref 70–99)
Glucose-Capillary: 190 mg/dL — ABNORMAL HIGH (ref 70–99)

## 2020-06-07 LAB — COMPREHENSIVE METABOLIC PANEL
ALT: 60 U/L — ABNORMAL HIGH (ref 0–44)
AST: 34 U/L (ref 15–41)
Albumin: 2.4 g/dL — ABNORMAL LOW (ref 3.5–5.0)
Alkaline Phosphatase: 218 U/L — ABNORMAL HIGH (ref 38–126)
Anion gap: 5 (ref 5–15)
BUN: 35 mg/dL — ABNORMAL HIGH (ref 6–20)
CO2: 38 mmol/L — ABNORMAL HIGH (ref 22–32)
Calcium: 8.6 mg/dL — ABNORMAL LOW (ref 8.9–10.3)
Chloride: 106 mmol/L (ref 98–111)
Creatinine, Ser: 0.77 mg/dL (ref 0.61–1.24)
GFR calc Af Amer: >60 mL/min (ref >60)
GFR calc non Af Amer: >60 mL/min (ref >60)
Glucose, Bld: 157 mg/dL — ABNORMAL HIGH (ref 70–99)
Potassium: 4 mmol/L (ref 3.5–5.1)
Sodium: 149 mmol/L — ABNORMAL HIGH (ref 135–145)
Total Bilirubin: 0.4 mg/dL (ref 0.3–1.2)
Total Protein: 5.5 g/dL — ABNORMAL LOW (ref 6.5–8.1)

## 2020-06-07 LAB — MAGNESIUM: Magnesium: 2.5 mg/dL — ABNORMAL HIGH (ref 1.7–2.4)

## 2020-06-07 LAB — PHOSPHORUS: Phosphorus: 3.7 mg/dL (ref 2.5–4.6)

## 2020-06-07 MED ORDER — FREE WATER
300.0000 mL | Status: DC
  Administered 2020-06-07 – 2020-06-10 (×18): 300 mL

## 2020-06-07 NOTE — Progress Notes (Signed)
NAME:  Brian Mack, MRN:  [id number], DOB:  1986-07-06, LOS: 23 ADMISSION DATE:  06/10/2020, CONSULTATION DATE:  9/2 REFERRING MD:  Ophelia Charter, CHIEF COMPLAINT:  Dyspnea   Brief History   34 y/o male admitted on 9/2 with COVID pneumonia after testing positive on 8/26.  PCCM consulted for admission on 9/3, treated by [facility] on progressive care, then condition worsened by 9/9 and he was eventually intubated on 9/11.   Past Medical History  Obesity  Significant Hospital Events   9/15 off NO 9/17 brady event, hypothermic  Consults:   Procedures:  ETT 9/11 > CVC 9/11 >  A-line 9/11 >   Significant Diagnostic Tests:  9/5 CT abd/pelvis > mild right hydrouteronephrosis with stone in mid ureter, diffuse pulmonary parenchymal findings consistent with pneumonia, suspected gallstone without acute cholecystitis, prior chronic inflammatory changes in distal ileum and ascending colon.  9/7 Echo> LVEF 60-65%, RV size, function normal, valves OK 9/11 Echo> LVEF 70-75%, RV size, function is normal, valves OK  Micro Data:  8/26 SARS COV 2 > positive 9/2 blood > negative 9/11 resp > negative 9/11 blood > negative 9/19 respiratory culture >  Antimicrobials:  Ceftriaxone 9/2>> 9/6 Azithromycin 9/2>>  9/6 Cefepime 9/11>9/14 vanc 9/11>9/14 Eraxis 9/11>9/13  Interim history/subjective:  Made supine last night. Worsening hypernatremia. More ventilator dyssynchrony this morning.  Objective   Blood pressure 136/71, pulse 77, temperature 98.3 F (36.8 C), temperature source Axillary, resp. rate (!) 0, height 5\' 9"  (1.753 m), weight 108.7 kg, SpO2 92 %.    Vent Mode: PRVC FiO2 (%):  [50 %-55 %] 50 % Set Rate:  [24 bmp-35 bmp] 24 bmp Vt Set:  [420 mL-560 mL] 560 mL PEEP:  [8 cmH20-12 cmH20] 8 cmH20 Plateau Pressure:  [23 cmH20-31 cmH20] 23 cmH20   Intake/Output Summary (Last 24 hours) at 06/07/2020 1320 Last data filed at 06/07/2020 1307 Gross per 24 hour  Intake 3883.32 ml  Output 3935 ml    Net -51.68 ml   Filed Weights   06/05/20 0459 06/06/20 0417 06/07/20 0425  Weight: 105.9 kg 108 kg 108.7 kg    Examination:   Physical exam: General: Acutely ill-appearing obese male, orally intubated, in prone position HEAENT: [place]/AT, eyes anicteric.  ETT and OGT in place Neuro: Sedated, not following commands.  Eyes are closed.  Pupils 3 mm bilateral reactive to light Chest: Crackles heard at the bases bilaterally left more than right, no wheezes or rhonchi Heart: Regular rate and rhythm, no murmurs or gallops Abdomen: Soft, nontender, nondistended, bowel sounds present Skin: No rash  Resolved Hospital Problem list   Precedex induced bradycardia AKI Assessment & Plan:  Acute hypoxic/hypercapnic respiratory failure due to ARDS from COVID-19 pneumonia  Acute respiratory acidosis Continue mechanical ventilation per ARDS protocol  I spent time at the bedside with RT titrating ventilator. Increased his Vt to 8cc/kg, decreased respiratory rate and increased inspiratory time due to dyssynchrony.  Target PaO2 55-80: titrate PEEP/FiO2 per protocol Ventilator associated pneumonia prevention protocol Continue tapered dose steroid Continue ketamine, Versed and fentanyl with RASS goal -4 to -5 He is a still intermittently requiring neuromuscular blockade. May need to consider continuous NMB if unable to maintain permissive hypercapnia.  We will give him 1 dose of IV Lasix today, goal is net -1 to 2 L Plan is for tracheostomy 9/26. Family consented today over the phone.  Acute metabolic alkalosis and acute respiratory acidosis Patient's mixed acid-base disorder is improving Continue acetazolamide for now Monitor BMP and ABGs  Hypertension Continue amlodipine 10 mg unsteady Continue clonidine to 0.2 mg twice daily Continue IV labetalol as needed  Prediabetes with hyperglycemia due to steroid Patient hemoglobin A1c 6.4 Continue sliding scale insulin, while on  steroid  Hypernatremia Worsening. Will increase free H20 flushes Monitor serum sodium  Hypokalemia/hypophosphatemia Electrolytes are being supplemented aggressively Continue to monitor  Best practice:  Diet: tube feeding Pain/Anxiety/Delirium protocol (if indicated): as above VAP protocol (if indicated): yes DVT prophylaxis: lovenox GI prophylaxis: Pantoprazole for stress ulcer prophylaxis Glucose control: SSI Mobility: bed rest Code Status: full Family Communication: updated patient's father Chanetta Marshall and consented for tracheostomy.  Disposition: remain in ICU  Labs   CBC: Recent Labs  Lab 06/01/20 0500 06/01/20 0616 06/02/20 0556 06/02/20 1343 06/03/20 0655 06/03/20 1537 06/05/20 0303 06/05/20 1822 06/06/20 0335 06/06/20 0400 06/06/20 1946  WBC 16.5*  --  18.4*  --  17.4*  --   --   --   --  13.0*  --   NEUTROABS 13.2*  --  15.5*  --   --   --   --   --   --   --   --   HGB 13.0   < > 12.3*   < > 11.2*   < > 11.9* 11.6* 10.9* 10.1* 11.6*  HCT 43.3   < > 42.7   < > 40.6   < > 35.0* 34.0* 32.0* 35.8* 34.0*  MCV 93.9  --  96.6  --  98.8  --   --   --   --  96.5  --   PLT 209  --  185  --  178  --   --   --   --  136*  --    < > = values in this interval not displayed.    Basic Metabolic Panel: Recent Labs  Lab 06/01/20 0500 06/01/20 0616 06/02/20 0556 06/02/20 1343 06/03/20 0655 06/03/20 1537 06/05/20 1822 06/06/20 0335 06/06/20 0400 06/06/20 1946 06/07/20 0424  NA 145   < > 146*   < > 148*   < > 143 147* 149* 146* 149*  K 3.7   < > 4.0   < > 4.4   < > 5.3* 3.3* 3.0* 4.4 4.0  CL 95*  --  96*  --  105  --   --   --  105  --  106  CO2 42*  --  41*  --  36*  --   --   --  35*  --  38*  GLUCOSE 113*  --  149*  --  125*  --   --   --  165*  --  157*  BUN 35*  --  33*  --  29*  --   --   --  32*  --  35*  CREATININE 0.73  --  0.91  --  0.90  --   --   --  0.71  --  0.77  CALCIUM 8.4*  --  8.2*  --  8.9  --   --   --  8.1*  --  8.6*  MG  --   --   --   --  2.1   --   --   --  2.2  --  2.5*  PHOS  --   --   --   --  3.1  --   --   --  2.1*  --  3.7   < > = values in  this interval not displayed.   GFR: Estimated Creatinine Clearance: 159.6 mL/min (by C-G formula based on SCr of 0.77 mg/dL). Recent Labs  Lab 06/01/20 0500 06/02/20 0556 06/03/20 0655 06/06/20 0400  WBC 16.5* 18.4* 17.4* 13.0*    Liver Function Tests: Recent Labs  Lab 06/01/20 0500 06/02/20 0556 06/06/20 0400 06/07/20 0424  AST 18 20 22  34  ALT 42 35 34 60*  ALKPHOS 74 80 91 218*  BILITOT 0.7 0.8 0.3 0.4  PROT 5.3* 5.3* 4.9* 5.5*  ALBUMIN 2.3* 2.3* 2.1* 2.4*   No results for input(s): LIPASE, AMYLASE in the last 168 hours. No results for input(s): AMMONIA in the last 168 hours.  ABG    Component Value Date/Time   PHART 7.272 (L) 06/06/2020 1946   PCO2ART 86.8 (HH) 06/06/2020 1946   PO2ART 71 (L) 06/06/2020 1946   HCO3 40.0 (H) 06/06/2020 1946   TCO2 43 (H) 06/06/2020 1946   O2SAT 90.0 06/06/2020 1946     Coagulation Profile: No results for input(s): INR, PROTIME in the last 168 hours.  Cardiac Enzymes: No results for input(s): CKTOTAL, CKMB, CKMBINDEX, TROPONINI in the last 168 hours.  HbA1C: Hgb A1c MFr Bld  Date/Time Value Ref Range Status  05/25/2020 10:24 AM 6.4 (H) 4.8 - 5.6 % Final    Comment:    (NOTE) Pre diabetes:          5.7%-6.4%  Diabetes:              >6.4%  Glycemic control for   <7.0% adults with diabetes     CBG: Recent Labs  Lab 06/06/20 2002 06/06/20 2330 06/07/20 0406 06/07/20 0825 06/07/20 1212  GLUCAP 147* 128* 130* 137* 135*     The patient is critically ill with multiple organ systems failure and requires high complexity decision making for assessment and support, frequent evaluation and titration of therapies, application of advanced monitoring technologies and extensive interpretation of multiple databases.   Critical Care Time devoted to patient care services described in this note is 40 minutes. This time  reflects time of care of this signee 06/09/20 . This critical care time does not reflect separately billable procedures or procedure time, teaching time or supervisory time of PA/NP/Med student/Med Resident etc but could involve care discussion time.  Charlott Holler [facility]monary [facility] Care Medicine 06/07/2020 1:39 PM  Pager: [phone] After hours pager: [phone]

## 2020-06-08 ENCOUNTER — Inpatient Hospital Stay (HOSPITAL_COMMUNITY): Payer: HRSA Program

## 2020-06-08 DIAGNOSIS — J9601 Acute respiratory failure with hypoxia: Secondary | ICD-10-CM

## 2020-06-08 LAB — CBC WITH DIFFERENTIAL/PLATELET
Abs Immature Granulocytes: 0.52 10*3/uL — ABNORMAL HIGH (ref 0.00–0.07)
Basophils Absolute: 0.1 10*3/uL (ref 0.0–0.1)
Basophils Relative: 1 %
Eosinophils Absolute: 0.4 10*3/uL (ref 0.0–0.5)
Eosinophils Relative: 4 %
HCT: 33 % — ABNORMAL LOW (ref 39.0–52.0)
Hemoglobin: 9.6 g/dL — ABNORMAL LOW (ref 13.0–17.0)
Immature Granulocytes: 5 %
Lymphocytes Relative: 15 %
Lymphs Abs: 1.5 10*3/uL (ref 0.7–4.0)
MCH: 28.6 pg (ref 26.0–34.0)
MCHC: 29.1 g/dL — ABNORMAL LOW (ref 30.0–36.0)
MCV: 98.2 fL (ref 80.0–100.0)
Monocytes Absolute: 1 10*3/uL (ref 0.1–1.0)
Monocytes Relative: 10 %
Neutro Abs: 6.9 10*3/uL (ref 1.7–7.7)
Neutrophils Relative %: 65 %
Platelets: 156 10*3/uL (ref 150–400)
RBC: 3.36 MIL/uL — ABNORMAL LOW (ref 4.22–5.81)
RDW: 15.8 % — ABNORMAL HIGH (ref 11.5–15.5)
WBC: 10.4 10*3/uL (ref 4.0–10.5)
nRBC: 1.8 % — ABNORMAL HIGH (ref 0.0–0.2)

## 2020-06-08 LAB — BASIC METABOLIC PANEL
Anion gap: 8 (ref 5–15)
BUN: 30 mg/dL — ABNORMAL HIGH (ref 6–20)
CO2: 29 mmol/L (ref 22–32)
Calcium: 8.1 mg/dL — ABNORMAL LOW (ref 8.9–10.3)
Chloride: 111 mmol/L (ref 98–111)
Creatinine, Ser: 0.8 mg/dL (ref 0.61–1.24)
GFR calc Af Amer: >60 mL/min (ref >60)
GFR calc non Af Amer: >60 mL/min (ref >60)
Glucose, Bld: 85 mg/dL (ref 70–99)
Potassium: 4 mmol/L (ref 3.5–5.1)
Sodium: 148 mmol/L — ABNORMAL HIGH (ref 135–145)

## 2020-06-08 LAB — POCT I-STAT 7, (LYTES, BLD GAS, ICA,H+H)
Acid-Base Excess: 8 mmol/L — ABNORMAL HIGH (ref 0.0–2.0)
Acid-Base Excess: 7 mmol/L — ABNORMAL HIGH (ref 0.0–2.0)
Bicarbonate: 34.4 mmol/L — ABNORMAL HIGH (ref 20.0–28.0)
Bicarbonate: 34.7 mmol/L — ABNORMAL HIGH (ref 20.0–28.0)
Calcium, Ion: 1.16 mmol/L (ref 1.15–1.40)
Calcium, Ion: 1.15 mmol/L (ref 1.15–1.40)
HCT: 32 % — ABNORMAL LOW (ref 39.0–52.0)
HCT: 31 % — ABNORMAL LOW (ref 39.0–52.0)
Hemoglobin: 10.9 g/dL — ABNORMAL LOW (ref 13.0–17.0)
Hemoglobin: 10.5 g/dL — ABNORMAL LOW (ref 13.0–17.0)
O2 Saturation: 97 %
O2 Saturation: 92 %
Patient temperature: 99.4
Potassium: 4.4 mmol/L (ref 3.5–5.1)
Potassium: 4.4 mmol/L (ref 3.5–5.1)
Sodium: 146 mmol/L — ABNORMAL HIGH (ref 135–145)
Sodium: 145 mmol/L (ref 135–145)
TCO2: 36 mmol/L — ABNORMAL HIGH (ref 22–32)
TCO2: 37 mmol/L — ABNORMAL HIGH (ref 22–32)
pCO2 arterial: 55.1 mmHg — ABNORMAL HIGH (ref 32.0–48.0)
pCO2 arterial: 66.2 mmHg (ref 32.0–48.0)
pH, Arterial: 7.403 (ref 7.350–7.450)
pH, Arterial: 7.329 — ABNORMAL LOW (ref 7.350–7.450)
pO2, Arterial: 95 mmHg (ref 83.0–108.0)
pO2, Arterial: 74 mmHg — ABNORMAL LOW (ref 83.0–108.0)

## 2020-06-08 LAB — GLUCOSE, CAPILLARY
Glucose-Capillary: 107 mg/dL — ABNORMAL HIGH (ref 70–99)
Glucose-Capillary: 108 mg/dL — ABNORMAL HIGH (ref 70–99)
Glucose-Capillary: 109 mg/dL — ABNORMAL HIGH (ref 70–99)
Glucose-Capillary: 135 mg/dL — ABNORMAL HIGH (ref 70–99)
Glucose-Capillary: 75 mg/dL (ref 70–99)
Glucose-Capillary: 75 mg/dL (ref 70–99)

## 2020-06-08 MED ORDER — MIDAZOLAM HCL 2 MG/2ML IJ SOLN
5.0000 mg | Freq: Once | INTRAMUSCULAR | Status: AC
  Administered 2020-06-08: 5 mg via INTRAVENOUS

## 2020-06-08 MED ORDER — SODIUM CHLORIDE 0.9 % IV SOLN: INTRAVENOUS | Status: DC | PRN

## 2020-06-08 MED ORDER — VECURONIUM BROMIDE 10 MG IV SOLR
10.0000 mg | Freq: Once | INTRAVENOUS | Status: AC
  Administered 2020-06-08: 10 mg via INTRAVENOUS
  Filled 2020-06-08: qty 10

## 2020-06-08 MED ORDER — FUROSEMIDE 10 MG/ML IJ SOLN
40.0000 mg | Freq: Once | INTRAMUSCULAR | Status: AC
  Administered 2020-06-08: 40 mg via INTRAVENOUS
  Filled 2020-06-08: qty 4

## 2020-06-08 MED ORDER — ETOMIDATE 2 MG/ML IV SOLN
20.0000 mg | Freq: Once | INTRAVENOUS | Status: AC
  Administered 2020-06-08: 20 mg via INTRAVENOUS
  Filled 2020-06-08: qty 10

## 2020-06-08 MED ORDER — FENTANYL CITRATE (PF) 100 MCG/2ML IJ SOLN
200.0000 ug | Freq: Once | INTRAMUSCULAR | Status: AC
  Administered 2020-06-08: 100 ug via INTRAVENOUS
  Filled 2020-06-08: qty 4

## 2020-06-08 NOTE — Progress Notes (Signed)
Assisted tele visit to patient with girlfriend.  Romana Deaton, Loni Beckwith, RN

## 2020-06-08 NOTE — Procedures (Signed)
Percutaneous Tracheostomy Procedure Note   Brian Mack  [id number]  1986/05/26  Date:06/08/20  Time:5:40 PM   Provider Performing:Demari Gales C Katrinka Blazing  Procedure: Percutaneous Tracheostomy with Bronchoscopic Guidance (41030)  Indication(s) Persistent resp failure  Consent Risks of the procedure as well as the alternatives and risks of each were explained to the patient and/or caregiver.  Consent for the procedure was obtained.  Anesthesia Etomidate, Versed, Fentanyl, Vecuronium   Time Out Verified patient identification, verified procedure, site/side was marked, verified correct patient position, special equipment/implants available, medications/allergies/relevant history reviewed, required imaging and test results available.   Sterile Technique Maximal sterile technique including sterile barrier drape, hand hygiene, sterile gown, sterile gloves, mask, hair covering.    Procedure Description Appropriate anatomy identified by palpation.  Patient's neck prepped and draped in sterile fashion.  1% lidocaine with epinephrine was used to anesthetize skin overlying neck.  1.5cm incision made and blunt dissection performed until tracheal rings could be easily palpated.   Then a size 8-0 Shiley tracheostomy was placed under bronchoscopic visualization using usual Seldinger technique and serial dilation.   Bronchoscope confirmed placement above the carina.  Tracheostomy was sutured in place with adhesive pad to protect skin under pressure.    Patient connected to ventilator.   Complications/Tolerance None; patient tolerated the procedure well. Chest X-ray is ordered to confirm no post-procedural complication.   EBL Minimal   Specimen(s) None

## 2020-06-08 NOTE — Procedures (Signed)
Arterial Catheter Insertion Procedure Note  Brian Mack  [id number]  Mar 22, 1986  Date:06/08/20  Time:5:23 PM    Provider Performing: Carolan Shiver    Procedure: Insertion of Arterial Line (83382) without US guidance  Indication(s) Blood pressure monitoring and/or need for frequent ABGs  Consent Unable to obtain consent due to emergent nature of procedure.  Anesthesia None   Time Out Verified patient identification, verified procedure, site/side was marked, verified correct patient position, special equipment/implants available, medications/allergies/relevant history reviewed, required imaging and test results available.   Sterile Technique Maximal sterile technique including full sterile barrier drape, hand hygiene, sterile gown, sterile gloves, mask, hair covering, sterile ultrasound probe cover (if used).   Procedure Description Area of catheter insertion was cleaned with chlorhexidine and draped in sterile fashion. Without real-time ultrasound guidance an arterial catheter was placed into the right radial artery.  Appropriate arterial tracings confirmed on monitor.     Complications/Tolerance None; patient tolerated the procedure well.   EBL Minimal   Specimen(s) None

## 2020-06-08 NOTE — Procedures (Signed)
Bedside Tracheostomy Insertion Procedure Note   Patient Details:   Name: Brian Mack DOB: Jul 16, 1986 MRN: [id number]  Procedure: Tracheostomy  Pre Procedure Assessment: ET Tube Size: 8.0 ET Tube secured at lip (cm): 25 Bite block in place: No Breath Sounds: Clear and Diminished  Post Procedure Assessment: BP 106/66   Pulse (!) 101   Temp 100.2 F (37.9 C) (Axillary) Comment: RN is aware  Resp (!) 26   Ht 5\' 9"  (1.753 m)   Wt 108.2 kg   SpO2 92%   BMI 35.23 kg/m  O2 sats: stable throughout Complications: No apparent complications Patient did tolerate procedure well Tracheostomy Brand:Shiley Tracheostomy Style:Cuffed Tracheostomy Size: 8 Tracheostomy Secured , velcro Tracheostomy Placement Confirmation:Trach cuff visualized and in place and Chest X ray ordered for placement    [id number] 06/08/2020, 5:16 PM

## 2020-06-08 NOTE — Procedures (Signed)
Diagnostic Bronchoscopy  Brian Mack  [id number]  11-Jul-1986  Date:06/08/20  Time:5:05 PM   Provider Performing:Charles Andringa Humphrey Rolls   Procedure: Diagnostic Bronchoscopy (47841)  Indication(s) Assist with direct visualization of tracheostomy placement  Consent Risks of the procedure as well as the alternatives and risks of each were explained to the patient and/or caregiver.  Consent for the procedure was obtained.   Anesthesia See separate tracheostomy note   Time Out Verified patient identification, verified procedure, site/side was marked, verified correct patient position, special equipment/implants available, medications/allergies/relevant history reviewed, required imaging and test results available.   Sterile Technique Usual hand hygiene, masks, gowns, and gloves were used   Procedure Description Bronchoscope advanced through endotracheal tube and into airway.  After suctioning out tracheal secretions, bronchoscope used to provide direct visualization of tracheostomy placement.   Complications/Tolerance None; patient tolerated the procedure well.   EBL None  Specimen(s) None

## 2020-06-08 NOTE — Progress Notes (Signed)
NAME:  Brian Mack, MRN:  [id number], DOB:  July 01, 1986, LOS: 24 ADMISSION DATE:  05-31-2020, CONSULTATION DATE:  9/2 REFERRING MD:  Ophelia Charter, CHIEF COMPLAINT:  Dyspnea   Brief History   34 y/o male admitted on 9/2 with COVID pneumonia after testing positive on 8/26.  PCCM consulted for admission on 9/3, treated by [facility] on progressive care, then condition worsened by 9/9 and he was eventually intubated on 9/11.   Past Medical History  Obesity  Significant Hospital Events   9/15 off NO 9/17 brady event, hypothermic  Consults:   Procedures:  ETT 9/11 > CVC 9/11 >  A-line 9/11 >   Significant Diagnostic Tests:  9/5 CT abd/pelvis > mild right hydrouteronephrosis with stone in mid ureter, diffuse pulmonary parenchymal findings consistent with pneumonia, suspected gallstone without acute cholecystitis, prior chronic inflammatory changes in distal ileum and ascending colon.  9/7 Echo> LVEF 60-65%, RV size, function normal, valves OK 9/11 Echo> LVEF 70-75%, RV size, function is normal, valves OK  Micro Data:  8/26 SARS COV 2 > positive 9/2 blood > negative 9/11 resp > negative 9/11 blood > negative 9/19 respiratory culture >  Antimicrobials:  Ceftriaxone 9/2>> 9/6 Azithromycin 9/2>>  9/6 Cefepime 9/11>9/14 vanc 9/11>9/14 Eraxis 9/11>9/13  Interim history/subjective:  On 50% FiO2, 8 of PEEP. Maintaining these settings.  Objective   Blood pressure 127/77, pulse (!) 101, temperature 100 F (37.8 C), temperature source Axillary, resp. rate (!) 0, height 5\' 9"  (1.753 m), weight 108.2 kg, SpO2 92 %.    Vent Mode: PRVC FiO2 (%):  [40 %-50 %] 50 % Set Rate:  [24 bmp-26 bmp] 26 bmp Vt Set:  [560 mL] 560 mL PEEP:  [8 cmH20] 8 cmH20 Plateau Pressure:  [28 cmH20-32 cmH20] 30 cmH20   Intake/Output Summary (Last 24 hours) at 06/08/2020 1112 Last data filed at 06/08/2020 0900 Gross per 24 hour  Intake 2975.47 ml  Output 3155 ml  Net -179.53 ml   Filed Weights   06/06/20 0417  06/07/20 0425 06/08/20 0500  Weight: 108 kg 108.7 kg 108.2 kg    Examination:   Physical exam: General: Acutely ill-appearing obese male, orally intubated, in supine position HEAENT: [place]/AT, eyes anicteric.  ETT and OGT in place Neuro: Sedated, not following commands.  Eyes are closed.  Chest: Crackles heard at the bases bilaterally left more than right, no wheezes or rhonchi Heart: Regular rate and rhythm, no murmurs or gallops Abdomen: Soft, nontender, nondistended, bowel sounds present Skin: No rash  Resolved Hospital Problem list   Precedex induced bradycardia AKI Assessment & Plan:  Acute hypoxic/hypercapnic respiratory failure due to ARDS from COVID-19 pneumonia  Acute respiratory acidosis Continue mechanical ventilation per ARDS protocol Target PaO2 55-80: titrate PEEP/FiO2 per protocol Ventilator associated pneumonia prevention protocol Continue tapered dose steroid Continue ketamine, Versed and fentanyl with RASS goal -4 to -5 Continuous NMB initiated We will give him 1 dose of IV Lasix today, goal is net -1 to 2 L. Also on diamox as below.  Plan is for tracheostomy today. Family consented today over the phone.  Acute metabolic alkalosis and acute respiratory acidosis Patient's mixed acid-base disorder is improving Continue acetazolamide for now Monitor BMP and ABGs  Hypertension Continue amlodipine 10 mg unsteady Continue clonidine to 0.2 mg twice daily Continue IV labetalol as needed  Prediabetes with hyperglycemia due to steroid Patient hemoglobin A1c 6.4 Continue sliding scale insulin, while on steroid  Hypernatremia Continue free H20 flushes Monitor serum sodium  Hypokalemia/hypophosphatemia Electrolytes are being  supplemented aggressively Continue to monitor  Best practice:  Diet: tube feeding Pain/Anxiety/Delirium protocol (if indicated): as above VAP protocol (if indicated): yes DVT prophylaxis: lovenox GI prophylaxis: Pantoprazole for stress  ulcer prophylaxis Glucose control: SSI Mobility: bed rest Code Status: full Family Communication: updated patient's father Chanetta Marshall and consented for tracheostomy 9/25 Disposition: remain in ICU  Labs   CBC: Recent Labs  Lab 06/02/20 0556 06/02/20 1343 06/03/20 0655 06/03/20 1537 06/06/20 0335 06/06/20 0400 06/06/20 1946 06/07/20 1820 06/08/20 0724  WBC 18.4*  --  17.4*  --   --  13.0*  --   --  10.4  NEUTROABS 15.5*  --   --   --   --   --   --   --  6.9  HGB 12.3*   < > 11.2*   < > 10.9* 10.1* 11.6* 10.5* 9.6*  HCT 42.7   < > 40.6   < > 32.0* 35.8* 34.0* 31.0* 33.0*  MCV 96.6  --  98.8  --   --  96.5  --   --  98.2  PLT 185  --  178  --   --  136*  --   --  156   < > = values in this interval not displayed.    Basic Metabolic Panel: Recent Labs  Lab 06/02/20 0556 06/02/20 1343 06/03/20 0655 06/03/20 1537 06/06/20 0400 06/06/20 1946 06/07/20 0424 06/07/20 1820 06/08/20 0724  NA 146*   < > 148*   < > 149* 146* 149* 146* 148*  K 4.0   < > 4.4   < > 3.0* 4.4 4.0 4.6 4.0  CL 96*  --  105  --  105  --  106  --  111  CO2 41*  --  36*  --  35*  --  38*  --  29  GLUCOSE 149*  --  125*  --  165*  --  157*  --  85  BUN 33*  --  29*  --  32*  --  35*  --  30*  CREATININE 0.91  --  0.90  --  0.71  --  0.77  --  0.80  CALCIUM 8.2*  --  8.9  --  8.1*  --  8.6*  --  8.1*  MG  --   --  2.1  --  2.2  --  2.5*  --   --   PHOS  --   --  3.1  --  2.1*  --  3.7  --   --    < > = values in this interval not displayed.   GFR: Estimated Creatinine Clearance: 159.2 mL/min (by C-G formula based on SCr of 0.8 mg/dL). Recent Labs  Lab 06/02/20 0556 06/03/20 0655 06/06/20 0400 06/08/20 0724  WBC 18.4* 17.4* 13.0* 10.4    Liver Function Tests: Recent Labs  Lab 06/02/20 0556 06/06/20 0400 06/07/20 0424  AST 20 22 34  ALT 35 34 60*  ALKPHOS 80 91 218*  BILITOT 0.8 0.3 0.4  PROT 5.3* 4.9* 5.5*  ALBUMIN 2.3* 2.1* 2.4*   No results for input(s): LIPASE, AMYLASE in the last 168  hours. No results for input(s): AMMONIA in the last 168 hours.  ABG    Component Value Date/Time   PHART 7.390 06/07/2020 1820   PCO2ART 54.5 (H) 06/07/2020 1820   PO2ART 62 (L) 06/07/2020 1820   HCO3 33.0 (H) 06/07/2020 1820   TCO2 35 (H) 06/07/2020 1820  O2SAT 91.0 06/07/2020 1820     Coagulation Profile: No results for input(s): INR, PROTIME in the last 168 hours.  Cardiac Enzymes: No results for input(s): CKTOTAL, CKMB, CKMBINDEX, TROPONINI in the last 168 hours.  HbA1C: Hgb A1c MFr Bld  Date/Time Value Ref Range Status  05/25/2020 10:24 AM 6.4 (H) 4.8 - 5.6 % Final    Comment:    (NOTE) Pre diabetes:          5.7%-6.4%  Diabetes:              >6.4%  Glycemic control for   <7.0% adults with diabetes     CBG: Recent Labs  Lab 06/07/20 1608 06/07/20 1954 06/07/20 2339 06/08/20 0335 06/08/20 0735  GLUCAP 190* 133* 128* 75 75     The patient is critically ill with multiple organ systems failure and requires high complexity decision making for assessment and support, frequent evaluation and titration of therapies, application of advanced monitoring technologies and extensive interpretation of multiple databases.   Critical Care Time devoted to patient care services described in this note is 32 minutes. This time reflects time of care of this signee Charlott Holler . This critical care time does not reflect separately billable procedures or procedure time, teaching time or supervisory time of PA/NP/Med student/Med Resident etc but could involve care discussion time.  Mickel Baas Pulmonary and Critical Care Medicine 06/08/2020 11:12 AM  Pager: [phone] After hours pager: [phone]

## 2020-06-09 DIAGNOSIS — G9340 Encephalopathy, unspecified: Secondary | ICD-10-CM

## 2020-06-09 DIAGNOSIS — Z978 Presence of other specified devices: Secondary | ICD-10-CM

## 2020-06-09 LAB — BASIC METABOLIC PANEL
Anion gap: 7 (ref 5–15)
BUN: 32 mg/dL — ABNORMAL HIGH (ref 6–20)
CO2: 32 mmol/L (ref 22–32)
Calcium: 8.4 mg/dL — ABNORMAL LOW (ref 8.9–10.3)
Chloride: 105 mmol/L (ref 98–111)
Creatinine, Ser: 0.91 mg/dL (ref 0.61–1.24)
GFR calc Af Amer: >60 mL/min (ref >60)
GFR calc non Af Amer: >60 mL/min (ref >60)
Glucose, Bld: 148 mg/dL — ABNORMAL HIGH (ref 70–99)
Potassium: 4.3 mmol/L (ref 3.5–5.1)
Sodium: 144 mmol/L (ref 135–145)

## 2020-06-09 LAB — POCT I-STAT 7, (LYTES, BLD GAS, ICA,H+H)
Acid-Base Excess: 6 mmol/L — ABNORMAL HIGH (ref 0.0–2.0)
Bicarbonate: 35 mmol/L — ABNORMAL HIGH (ref 20.0–28.0)
Calcium, Ion: 1.21 mmol/L (ref 1.15–1.40)
HCT: 33 % — ABNORMAL LOW (ref 39.0–52.0)
Hemoglobin: 11.2 g/dL — ABNORMAL LOW (ref 13.0–17.0)
O2 Saturation: 91 %
Patient temperature: 99.3
Potassium: 4.1 mmol/L (ref 3.5–5.1)
Sodium: 144 mmol/L (ref 135–145)
TCO2: 37 mmol/L — ABNORMAL HIGH (ref 22–32)
pCO2 arterial: 78.4 mmHg (ref 32.0–48.0)
pH, Arterial: 7.259 — ABNORMAL LOW (ref 7.350–7.450)
pO2, Arterial: 74 mmHg — ABNORMAL LOW (ref 83.0–108.0)

## 2020-06-09 LAB — CBC WITH DIFFERENTIAL/PLATELET
Abs Immature Granulocytes: 0.73 10*3/uL — ABNORMAL HIGH (ref 0.00–0.07)
Basophils Absolute: 0.1 10*3/uL (ref 0.0–0.1)
Basophils Relative: 1 %
Eosinophils Absolute: 0.8 10*3/uL — ABNORMAL HIGH (ref 0.0–0.5)
Eosinophils Relative: 6 %
HCT: 38.7 % — ABNORMAL LOW (ref 39.0–52.0)
Hemoglobin: 11.1 g/dL — ABNORMAL LOW (ref 13.0–17.0)
Immature Granulocytes: 5 %
Lymphocytes Relative: 18 %
Lymphs Abs: 2.5 10*3/uL (ref 0.7–4.0)
MCH: 28 pg (ref 26.0–34.0)
MCHC: 28.7 g/dL — ABNORMAL LOW (ref 30.0–36.0)
MCV: 97.7 fL (ref 80.0–100.0)
Monocytes Absolute: 1.5 10*3/uL — ABNORMAL HIGH (ref 0.1–1.0)
Monocytes Relative: 10 %
Neutro Abs: 8.5 10*3/uL — ABNORMAL HIGH (ref 1.7–7.7)
Neutrophils Relative %: 60 %
Platelets: 219 10*3/uL (ref 150–400)
RBC: 3.96 MIL/uL — ABNORMAL LOW (ref 4.22–5.81)
RDW: 16.5 % — ABNORMAL HIGH (ref 11.5–15.5)
WBC: 14.2 10*3/uL — ABNORMAL HIGH (ref 4.0–10.5)
nRBC: 1.8 % — ABNORMAL HIGH (ref 0.0–0.2)

## 2020-06-09 LAB — GLUCOSE, CAPILLARY
Glucose-Capillary: 110 mg/dL — ABNORMAL HIGH (ref 70–99)
Glucose-Capillary: 134 mg/dL — ABNORMAL HIGH (ref 70–99)
Glucose-Capillary: 148 mg/dL — ABNORMAL HIGH (ref 70–99)
Glucose-Capillary: 168 mg/dL — ABNORMAL HIGH (ref 70–99)
Glucose-Capillary: 173 mg/dL — ABNORMAL HIGH (ref 70–99)
Glucose-Capillary: 182 mg/dL — ABNORMAL HIGH (ref 70–99)
Glucose-Capillary: 90 mg/dL (ref 70–99)

## 2020-06-09 MED ORDER — CARVEDILOL 12.5 MG PO TABS
12.5000 mg | ORAL_TABLET | Freq: Two times a day (BID) | ORAL | Status: DC
  Administered 2020-06-09: 12.5 mg via ORAL
  Filled 2020-06-09: qty 1

## 2020-06-09 MED ORDER — QUETIAPINE FUMARATE 25 MG PO TABS
25.0000 mg | ORAL_TABLET | Freq: Two times a day (BID) | ORAL | Status: DC
  Administered 2020-06-09: 25 mg via ORAL
  Filled 2020-06-09: qty 1

## 2020-06-09 MED ORDER — LABETALOL HCL 5 MG/ML IV SOLN
10.0000 mg | INTRAVENOUS | Status: DC | PRN
  Administered 2020-06-09 – 2020-06-10 (×3): 10 mg via INTRAVENOUS
  Filled 2020-06-09 (×2): qty 4

## 2020-06-09 MED ORDER — MIDAZOLAM BOLUS VIA INFUSION
1.0000 mg | INTRAVENOUS | Status: DC | PRN
  Administered 2020-06-09: 2 mg via INTRAVENOUS
  Administered 2020-06-09: 1 mg via INTRAVENOUS
  Administered 2020-06-09 – 2020-06-13 (×11): 2 mg via INTRAVENOUS
  Filled 2020-06-09: qty 2

## 2020-06-09 MED ORDER — CARVEDILOL 12.5 MG PO TABS
12.5000 mg | ORAL_TABLET | Freq: Two times a day (BID) | ORAL | Status: DC
  Administered 2020-06-10: 12.5 mg
  Filled 2020-06-09 (×2): qty 1

## 2020-06-09 MED ORDER — PREDNISONE 20 MG PO TABS
20.0000 mg | ORAL_TABLET | Freq: Every day | ORAL | Status: DC
  Administered 2020-06-10: 20 mg via ORAL
  Filled 2020-06-09: qty 1

## 2020-06-09 MED ORDER — CLONIDINE HCL 0.1 MG PO TABS: 0.1000 mg | ORAL_TABLET | Freq: Every day | ORAL | Status: DC

## 2020-06-09 MED ORDER — HYDROMORPHONE BOLUS VIA INFUSION
0.5000 mg | INTRAVENOUS | Status: DC | PRN
  Administered 2020-06-09 – 2020-06-13 (×9): 0.5 mg via INTRAVENOUS
  Filled 2020-06-09 (×2): qty 1

## 2020-06-09 MED ORDER — CLONIDINE HCL 0.1 MG PO TABS
0.1000 mg | ORAL_TABLET | Freq: Two times a day (BID) | ORAL | Status: AC
  Administered 2020-06-09 – 2020-06-10 (×3): 0.1 mg
  Filled 2020-06-09 (×3): qty 1

## 2020-06-09 MED ORDER — QUETIAPINE FUMARATE 25 MG PO TABS
25.0000 mg | ORAL_TABLET | Freq: Two times a day (BID) | ORAL | Status: DC
  Administered 2020-06-09 – 2020-06-10 (×2): 25 mg
  Filled 2020-06-09 (×2): qty 1

## 2020-06-09 MED ORDER — MIDAZOLAM 50MG/50ML (1MG/ML) PREMIX INFUSION
0.5000 mg/h | INTRAVENOUS | Status: DC
  Administered 2020-06-09 – 2020-06-10 (×4): 10 mg/h via INTRAVENOUS
  Administered 2020-06-10: 9 mg/h via INTRAVENOUS
  Administered 2020-06-10: 8 mg/h via INTRAVENOUS
  Administered 2020-06-10: 9 mg/h via INTRAVENOUS
  Administered 2020-06-12: 0.5 mg/h via INTRAVENOUS
  Administered 2020-06-12: 10 mg/h via INTRAVENOUS
  Administered 2020-06-12: 7 mg/h via INTRAVENOUS
  Administered 2020-06-13 – 2020-06-15 (×13): 10 mg/h via INTRAVENOUS
  Filled 2020-06-09 (×25): qty 50

## 2020-06-09 MED ORDER — PREDNISONE 10 MG PO TABS: 10.0000 mg | ORAL_TABLET | Freq: Every day | ORAL | Status: DC

## 2020-06-09 NOTE — Progress Notes (Signed)
PCCM progress note  Attempted to call both mother and father of patient but both unable to answer.  Patient's significant other Cala Bradford was called and updated over the phone 9/27.  She was updated regarding plan for decreasing sedation today.  She was made aware of placement of tracheostomy 9/26.  All questions answered.  Delfin Gant, NP-C [facility] Contact / Pager information can be found on Amion  06/09/2020, 11:29 AM

## 2020-06-09 NOTE — Progress Notes (Signed)
SLP Cancellation Note  Patient Details Name: Brian Mack MRN: [id number] DOB: 12-Jun-1986   Cancelled treatment:       Reason Eval/Treat Not Completed: Patient not medically ready. Patient with new tracheostomy. Orders for SLP eval and treat for PMSV and swallowing received. Will follow pt closely for readiness for SLP interventions as appropriate.    Harlon Ditty, MA CCC-SLP  Acute Rehabilitation Services Pager [phone] Office [phone]  Claudine Mouton 06/09/2020, 8:01 AM

## 2020-06-09 NOTE — Progress Notes (Signed)
NAME:  Brian Mack, MRN:  [id number], DOB:  [birth year], LOS: 25 ADMISSION DATE:  05/19/2020, CONSULTATION DATE:  9/2 REFERRING MD:  Lorin Mercy, CHIEF COMPLAINT:  Dyspnea   Brief History   34 y/o male admitted on 9/2 with COVID pneumonia after testing positive on 8/26.  PCCM consulted for admission on 9/3, treated by [facility] on progressive care, then condition worsened by 9/9 and he was eventually intubated on 9/11.   Past Medical History  Obesity  Significant Hospital Events   9/15 off NO 9/17 brady event, hypothermic  Consults:   Procedures:  ETT 9/11 > 9/26 CVC 9/11 > 9/23 Left A-line 9/12 > 9/25 3L left PICC 9/20 > Right A-lin 9/26 >  Trach 9/26 >   Significant Diagnostic Tests:  9/5 CT abd/pelvis > mild right hydrouteronephrosis with stone in mid ureter, diffuse pulmonary parenchymal findings consistent with pneumonia, suspected gallstone without acute cholecystitis, prior chronic inflammatory changes in distal ileum and ascending colon.  9/7 Echo> LVEF 60-65%, RV size, function normal, valves OK 9/11 Echo> LVEF 70-75%, RV size, function is normal, valves OK  Micro Data:  8/26 SARS COV 2 > positive 9/2 blood > negative 9/11 resp > negative 9/11 blood > negative 9/19 respiratory culture > canceled   Antimicrobials:  Ceftriaxone 9/2>> 9/6 Azithromycin 9/2>>  9/6 Cefepime 9/11>9/14 vanc 9/11>9/14 Eraxis 9/11>9/13  Interim history/subjective:  Lying in bed sedated, will open eyes to verbal stimuli No acute events overnight Diuresing well, +1L  Objective   Blood pressure (!) 169/64, pulse (!) 116, temperature 98.9 F (37.2 C), temperature source Axillary, resp. rate (!) 32, height $RemoveBe'5\' 9"'XlKNsvSTW$  (1.753 m), weight 107.1 kg, SpO2 95 %.    Vent Mode: PRVC FiO2 (%):  [50 %-100 %] 50 % Set Rate:  [26 bmp-32 bmp] 32 bmp Vt Set:  [420 mL-560 mL] 420 mL PEEP:  [8 cmH20-10 cmH20] 8 cmH20 Plateau Pressure:  [27 cmH20-30 cmH20] 27 cmH20   Intake/Output Summary (Last 24 hours) at  06/09/2020 0919 Last data filed at 06/09/2020 0744 Gross per 24 hour  Intake 3507.7 ml  Output 3220 ml  Net 287.7 ml   Filed Weights   06/07/20 0425 06/08/20 0500 06/09/20 0324  Weight: 108.7 kg 108.2 kg 107.1 kg    Examination: General: Acute ill-appearing well-developed adult gentleman lying in bed in no acute distress HEENT: 8 cuffed Shiley trach, MM pink/moist, PERRL,  Neuro: Sedated on ventilator, will open eyes to verbal stimuli CV: s1s2 regular rate and rhythm, no murmur, rubs, or gallops,  PULM: Mechanical breath sounds, tolerating vent well, no added breath sounds, FiO2 50% GI: soft, bowel sounds active in all 4 quadrants, non-tender, non-distended, tolerating TF Extremities: warm/dry, generalized nonpitting edema  Skin: no rashes or lesions  Resolved Hospital Problem list   Precedex induced bradycardia AKI Hypokalemia/hypophosphatemia  Assessment & Plan:  Acute hypoxic/hypercapnic respiratory failure due to ARDS from COVID-19 pneumonia  -Continues to met criteria for moderate ARDS with P/F ratio of 148 (mortality of 32%)  -Completed baricitinib 9/13  Remdisivir 9/2 > 9/3  Acute respiratory acidosis with compensated metabolic alkalosis  -Patient continue to have PCO2 in the 78.4 pH 7.25 9/27 P: Continue ventilator support with lung protective strategies per ARDS protocol  Wean PEEP and FiO2 for sats greater than 88%. Permissive hypercapnia, pH goal 7.2 or better. Head of bed elevated 30 degrees. Plateau pressures less than 30 cm H20, currently 27 Driving pressure goal less than 15, currently 19 Follow intermittent chest x-ray and ABG Hold  SAT/SBT until sedation can be decreased  Ensure adequate pulmonary hygiene  Follow cultures  VAP bundle in place  PAD protocol; continuous Dilaudid, Ketamine, and versed. Wean ketamine of Off continuous NMB, last PRN 9/26 RASS goal -3 Continue scheduled Diamox Continue steroid taper   Hypertension Mild tachycardia -Not on  antihypertensives at baseline  P: Continue Norvasc and Clonidine Start Coreg PRN hydralazine and Labetalol Wean Ketamine   Prediabetes with hyperglycemia due to steroid -Patient hemoglobin A1c 6.4 P: Continue SSI Blood sugars well controlled   Hypernatremia, improved P: Continue free water  Trend Bmet    At risk malnutrition P: Continue TF  Best practice:  Diet: tube feeding Pain/Anxiety/Delirium protocol (if indicated): as above VAP protocol (if indicated): yes DVT prophylaxis: lovenox GI prophylaxis: Pantoprazole for stress ulcer prophylaxis Glucose control: SSI Mobility: bed rest Code Status: full Family Communication: We will update patient's family 9/27 Disposition: remain in ICU  Labs   CBC: Recent Labs  Lab 06/03/20 0655 06/03/20 1537 06/06/20 0400 06/06/20 1946 06/08/20 0724 06/08/20 1716 06/08/20 1832 06/09/20 0138 06/09/20 0323  WBC 17.4*  --  13.0*  --  10.4  --   --   --  14.2*  NEUTROABS  --   --   --   --  6.9  --   --   --  8.5*  HGB 11.2*   < > 10.1*   < > 9.6* 10.9* 10.5* 11.2* 11.1*  HCT 40.6   < > 35.8*   < > 33.0* 32.0* 31.0* 33.0* 38.7*  MCV 98.8  --  96.5  --  98.2  --   --   --  97.7  PLT 178  --  136*  --  156  --   --   --  219   < > = values in this interval not displayed.    Basic Metabolic Panel: Recent Labs  Lab 06/03/20 0655 06/03/20 1537 06/06/20 0400 06/06/20 1946 06/07/20 0424 06/07/20 1820 06/08/20 0724 06/08/20 1716 06/08/20 1832 06/09/20 0138 06/09/20 0323  NA 148*   < > 149*   < > 149*   < > 148* 146* 145 144 144  K 4.4   < > 3.0*   < > 4.0   < > 4.0 4.4 4.4 4.1 4.3  CL 105  --  105  --  106  --  111  --   --   --  105  CO2 36*  --  35*  --  38*  --  29  --   --   --  32  GLUCOSE 125*  --  165*  --  157*  --  85  --   --   --  148*  BUN 29*  --  32*  --  35*  --  30*  --   --   --  32*  CREATININE 0.90  --  0.71  --  0.77  --  0.80  --   --   --  0.91  CALCIUM 8.9  --  8.1*  --  8.6*  --  8.1*  --   --    --  8.4*  MG 2.1  --  2.2  --  2.5*  --   --   --   --   --   --   PHOS 3.1  --  2.1*  --  3.7  --   --   --   --   --   --    < > =  values in this interval not displayed.   GFR: Estimated Creatinine Clearance: 139.3 mL/min (by C-G formula based on SCr of 0.91 mg/dL). Recent Labs  Lab 06/03/20 0655 06/06/20 0400 06/08/20 0724 06/09/20 0323  WBC 17.4* 13.0* 10.4 14.2*    Liver Function Tests: Recent Labs  Lab 06/06/20 0400 06/07/20 0424  AST 22 34  ALT 34 60*  ALKPHOS 91 218*  BILITOT 0.3 0.4  PROT 4.9* 5.5*  ALBUMIN 2.1* 2.4*   No results for input(s): LIPASE, AMYLASE in the last 168 hours. No results for input(s): AMMONIA in the last 168 hours.  ABG    Component Value Date/Time   PHART 7.259 (L) 06/09/2020 0138   PCO2ART 78.4 (HH) 06/09/2020 0138   PO2ART 74 (L) 06/09/2020 0138   HCO3 35.0 (H) 06/09/2020 0138   TCO2 37 (H) 06/09/2020 0138   O2SAT 91.0 06/09/2020 0138     Coagulation Profile: No results for input(s): INR, PROTIME in the last 168 hours.  Cardiac Enzymes: No results for input(s): CKTOTAL, CKMB, CKMBINDEX, TROPONINI in the last 168 hours.  HbA1C: Hgb A1c MFr Bld  Date/Time Value Ref Range Status  05/25/2020 10:24 AM 6.4 (H) 4.8 - 5.6 % Final    Comment:    (NOTE) Pre diabetes:          5.7%-6.4%  Diabetes:              >6.4%  Glycemic control for   <7.0% adults with diabetes     CBG: Recent Labs  Lab 06/08/20 1519 06/08/20 2010 06/08/20 2338 06/09/20 0321 06/09/20 0736  GLUCAP 109* 135* 108* 134* 182*   CRITICAL CARE Performed by: Johnsie Cancel  Total critical care time: 40 minutes  Critical care time was exclusive of separately billable procedures and treating other patients.  Critical care was necessary to treat or prevent imminent or life-threatening deterioration.  Critical care was time spent personally by me on the following activities: development of treatment plan with patient and/or surrogate as well as  nursing, discussions with consultants, evaluation of patient's response to treatment, examination of patient, obtaining history from patient or surrogate, ordering and performing treatments and interventions, ordering and review of laboratory studies, ordering and review of radiographic studies, pulse oximetry and re-evaluation of patient's condition.  Johnsie Cancel, NP-C [facility] Contact / Pager information can be found on Amion  06/09/2020, 9:37 AM

## 2020-06-09 NOTE — Progress Notes (Signed)
PT Cancellation Note  Patient Details Name: Brian Mack MRN: [id number] DOB: 09/25/1985   Cancelled Treatment:    Reason Eval/Treat Not Completed: Medical issues which prohibited therapy;Patient not medically ready.  Will sign off per RN and ask for reorder with this pt can participate. 06/09/2020  Jacinto Halim., PT Acute Rehabilitation Services [phone]  (pager) [phone]  (office)   [name] 06/09/2020, 2:29 PM

## 2020-06-09 NOTE — Progress Notes (Signed)
Patient's BP remains high 173/60 (86) despite 20 mg Hydralazine. Elink notified. Awaiting orders.   Cranford Mon

## 2020-06-09 NOTE — Progress Notes (Signed)
eLink Physician-Brief Progress Note Patient Name: Brian Mack DOB: Aug 06, 1986 MRN: [id number]   Date of Service  06/09/2020  HPI/Events of Note  Notified of hypertension SBP 170-180 HR 117 On amlodipine 10, clonidine 0.2 BID, hydralazine prn Labetalol ordered as prn for HR > 110  eICU Interventions  Added SBP > 170 as indication for labetalol     Intervention Category Major Interventions: Hypertension - evaluation and management  Rosalie Gums [name] 06/09/2020, 5:10 AM

## 2020-06-10 ENCOUNTER — Inpatient Hospital Stay (HOSPITAL_COMMUNITY): Payer: HRSA Program

## 2020-06-10 DIAGNOSIS — Z6837 Body mass index (BMI) 37.0-37.9, adult: Secondary | ICD-10-CM

## 2020-06-10 LAB — HEMOGLOBIN AND HEMATOCRIT, BLOOD
HCT: 34.6 % — ABNORMAL LOW (ref 39.0–52.0)
Hemoglobin: 10.1 g/dL — ABNORMAL LOW (ref 13.0–17.0)

## 2020-06-10 LAB — CBC WITH DIFFERENTIAL/PLATELET
Abs Immature Granulocytes: 0.51 10*3/uL — ABNORMAL HIGH (ref 0.00–0.07)
Basophils Absolute: 0.1 10*3/uL (ref 0.0–0.1)
Basophils Relative: 0 %
Eosinophils Absolute: 0.5 10*3/uL (ref 0.0–0.5)
Eosinophils Relative: 5 %
HCT: 34.8 % — ABNORMAL LOW (ref 39.0–52.0)
Hemoglobin: 10.1 g/dL — ABNORMAL LOW (ref 13.0–17.0)
Immature Granulocytes: 4 %
Lymphocytes Relative: 15 %
Lymphs Abs: 1.7 10*3/uL (ref 0.7–4.0)
MCH: 28.2 pg (ref 26.0–34.0)
MCHC: 29 g/dL — ABNORMAL LOW (ref 30.0–36.0)
MCV: 97.2 fL (ref 80.0–100.0)
Monocytes Absolute: 1.2 10*3/uL — ABNORMAL HIGH (ref 0.1–1.0)
Monocytes Relative: 10 %
Neutro Abs: 7.8 10*3/uL — ABNORMAL HIGH (ref 1.7–7.7)
Neutrophils Relative %: 66 %
Platelets: 213 10*3/uL (ref 150–400)
RBC: 3.58 MIL/uL — ABNORMAL LOW (ref 4.22–5.81)
RDW: 16.1 % — ABNORMAL HIGH (ref 11.5–15.5)
WBC: 11.8 10*3/uL — ABNORMAL HIGH (ref 4.0–10.5)
nRBC: 1.4 % — ABNORMAL HIGH (ref 0.0–0.2)

## 2020-06-10 LAB — BASIC METABOLIC PANEL
Anion gap: 9 (ref 5–15)
BUN: 30 mg/dL — ABNORMAL HIGH (ref 6–20)
CO2: 30 mmol/L (ref 22–32)
Calcium: 8.3 mg/dL — ABNORMAL LOW (ref 8.9–10.3)
Chloride: 101 mmol/L (ref 98–111)
Creatinine, Ser: 0.78 mg/dL (ref 0.61–1.24)
GFR calc Af Amer: >60 mL/min (ref >60)
GFR calc non Af Amer: >60 mL/min (ref >60)
Glucose, Bld: 119 mg/dL — ABNORMAL HIGH (ref 70–99)
Potassium: 4 mmol/L (ref 3.5–5.1)
Sodium: 140 mmol/L (ref 135–145)

## 2020-06-10 LAB — GLUCOSE, CAPILLARY
Glucose-Capillary: 115 mg/dL — ABNORMAL HIGH (ref 70–99)
Glucose-Capillary: 142 mg/dL — ABNORMAL HIGH (ref 70–99)
Glucose-Capillary: 145 mg/dL — ABNORMAL HIGH (ref 70–99)
Glucose-Capillary: 163 mg/dL — ABNORMAL HIGH (ref 70–99)
Glucose-Capillary: 166 mg/dL — ABNORMAL HIGH (ref 70–99)
Glucose-Capillary: 178 mg/dL — ABNORMAL HIGH (ref 70–99)

## 2020-06-10 LAB — PROTIME-INR
INR: 1.1 (ref 0.8–1.2)
Prothrombin Time: 13.4 seconds (ref 11.4–15.2)

## 2020-06-10 LAB — APTT: aPTT: 29 seconds (ref 24–36)

## 2020-06-10 MED ORDER — CARVEDILOL 25 MG PO TABS
25.0000 mg | ORAL_TABLET | Freq: Two times a day (BID) | ORAL | Status: DC
  Administered 2020-06-10 – 2020-06-11 (×2): 25 mg
  Filled 2020-06-10 (×2): qty 1

## 2020-06-10 MED ORDER — FREE WATER
300.0000 mL | Freq: Four times a day (QID) | Status: DC
  Administered 2020-06-10 – 2020-06-15 (×19): 300 mL

## 2020-06-10 MED ORDER — PREDNISONE 10 MG PO TABS
10.0000 mg | ORAL_TABLET | Freq: Every day | ORAL | Status: AC
  Administered 2020-06-13 – 2020-06-15 (×3): 10 mg
  Filled 2020-06-10 (×3): qty 1

## 2020-06-10 MED ORDER — QUETIAPINE FUMARATE 25 MG PO TABS
25.0000 mg | ORAL_TABLET | Freq: Two times a day (BID) | ORAL | Status: DC
  Administered 2020-06-10 – 2020-06-15 (×10): 25 mg
  Filled 2020-06-10 (×10): qty 1

## 2020-06-10 MED ORDER — METHADONE HCL 5 MG PO TABS
5.0000 mg | ORAL_TABLET | Freq: Three times a day (TID) | ORAL | Status: DC
  Administered 2020-06-10 – 2020-06-15 (×14): 5 mg
  Filled 2020-06-10 (×14): qty 1

## 2020-06-10 MED ORDER — OXYCODONE HCL 5 MG PO TABS
5.0000 mg | ORAL_TABLET | Freq: Two times a day (BID) | ORAL | Status: DC
  Administered 2020-06-14 – 2020-06-15 (×3): 5 mg
  Filled 2020-06-10 (×3): qty 1

## 2020-06-10 MED ORDER — OXYCODONE HCL 5 MG PO TABS: 10.0000 mg | ORAL_TABLET | Freq: Three times a day (TID) | ORAL | Status: DC

## 2020-06-10 MED ORDER — OXYCODONE HCL 5 MG PO TABS: 5.0000 mg | ORAL_TABLET | Freq: Two times a day (BID) | ORAL | Status: DC

## 2020-06-10 MED ORDER — OXYCODONE HCL 5 MG PO TABS
10.0000 mg | ORAL_TABLET | Freq: Three times a day (TID) | ORAL | Status: AC
  Administered 2020-06-11 (×3): 10 mg
  Filled 2020-06-10 (×3): qty 2

## 2020-06-10 MED ORDER — QUETIAPINE FUMARATE 50 MG PO TABS: 50.0000 mg | ORAL_TABLET | Freq: Two times a day (BID) | ORAL | Status: DC

## 2020-06-10 MED ORDER — CLONAZEPAM 1 MG PO TABS: 2.0000 mg | ORAL_TABLET | Freq: Two times a day (BID) | ORAL | Status: DC

## 2020-06-10 MED ORDER — CLONAZEPAM 1 MG PO TABS
2.0000 mg | ORAL_TABLET | Freq: Two times a day (BID) | ORAL | Status: DC
  Administered 2020-06-10 – 2020-06-15 (×10): 2 mg
  Filled 2020-06-10 (×10): qty 2

## 2020-06-10 MED ORDER — PREDNISONE 20 MG PO TABS
20.0000 mg | ORAL_TABLET | Freq: Every day | ORAL | Status: AC
  Administered 2020-06-11 – 2020-06-12 (×2): 20 mg
  Filled 2020-06-10 (×2): qty 1

## 2020-06-10 MED ORDER — METHADONE HCL 10 MG PO TABS
5.0000 mg | ORAL_TABLET | Freq: Three times a day (TID) | ORAL | Status: DC
  Administered 2020-06-10: 5 mg
  Filled 2020-06-10: qty 1

## 2020-06-10 MED ORDER — SODIUM CHLORIDE 0.9 % IV SOLN
0.5000 mg/h | INTRAVENOUS | Status: DC
  Administered 2020-06-10 – 2020-06-12 (×4): 3 mg/h via INTRAVENOUS
  Administered 2020-06-12 – 2020-06-15 (×6): 4 mg/h via INTRAVENOUS
  Filled 2020-06-10 (×12): qty 5

## 2020-06-10 MED ORDER — CLONIDINE HCL 0.1 MG PO TABS: 0.1000 mg | ORAL_TABLET | Freq: Every day | ORAL | Status: DC

## 2020-06-10 MED ORDER — OXYCODONE HCL 5 MG PO TABS: 10.0000 mg | ORAL_TABLET | Freq: Two times a day (BID) | ORAL | Status: DC

## 2020-06-10 MED ORDER — OXYCODONE HCL 5 MG PO TABS
10.0000 mg | ORAL_TABLET | Freq: Two times a day (BID) | ORAL | Status: AC
  Administered 2020-06-12 – 2020-06-13 (×4): 10 mg
  Filled 2020-06-10 (×4): qty 2

## 2020-06-10 NOTE — Progress Notes (Signed)
I have called patient's mother, and I have updated her  by phone. I have discussed the patient's VS, oxygen needs and current sedation needs. We discussed that her son is following some simple commands. I explained that we are working on weaning his sedation drips today, and adding medications through his tube for now. We discussed today's plan of care in full. Questions were asked and answered.

## 2020-06-10 NOTE — Progress Notes (Signed)
Nutrition Follow-up  DOCUMENTATION CODES:   Obesity unspecified  INTERVENTION:   Tube Feeding via Cortrak: Vital 1.5 at 65 ml/hr Pro-Source TF 90 mL TID Provides 2580 kcals, 171 g of protein, 1094 mL of free water Meets 100% estimaed calorie and protein needs  300 ml every 6 hours Total free water: 2294 ml    NUTRITION DIAGNOSIS:   Inadequate oral intake related to acute illness, poor appetite as evidenced by meal completion < 25%, NPO status.  Ongoing.   GOAL:   Patient will meet greater than or equal to 90% of their needs  Met with TF.   MONITOR:   Labs, Weight trends, TF tolerance, Vent status, Skin  REASON FOR ASSESSMENT:   Consult, Ventilator Enteral/tube feeding initiation and management  ASSESSMENT:   34 yo unvaccinated male admitted with severe COVID-19 pneumonia on 9/2 with progressive decline requiring intubation on 9/1 with severe ARDS, worsening AKI. No PMH  Per CCM and RN plan to lighten pt's sedation and then attempt weaning trials.    9/02 Admitted 9/11 Intubated 9/16 OG becaome dislodged while in prone positio, unable to replace 9/17 cortrak; tip gastric  9/27 trach placed  Patient is currently intubated on ventilator support MV: 10.9 L/min Temp (24hrs), Avg:100.3 F (37.9 C), Min:99.8 F (37.7 C), Max:100.8 F (38.2 C)  Medications reviewed and include: SSI, prednisone  Versed Ketamine off  Labs reviewed:  CBG's: 110-178     Diet Order:   Diet Order            Diet NPO time specified  Diet effective midnight                 EDUCATION NEEDS:   Not appropriate for education at this time  Skin:  Skin Assessment: Skin Integrity Issues: Skin Integrity Issues:: Other (Comment) Other: MASD  Last BM:  450 ml via rectal tube  Height:   Ht Readings from Last 1 Encounters:  06/09/20 _0  (1.753 m)    Weight:   Wt Readings from Last 1 Encounters:  06/10/20 110.2 kg    Ideal Body Weight:   (IBW: 72.7 kg, Adjusted  BW: 89.9 kg)  BMI:  Body mass index is 35.88 kg/m.  Estimated Nutritional Needs:   Kcal:  2500-2700 kcals  Protein:  140-180 g  Fluid:  >/= 2 L  Gladyes Kudo P., RD, LDN, CNSC See AMiON for contact information

## 2020-06-10 NOTE — Plan of Care (Signed)
  Problem: Coping: Goal: Psychosocial and spiritual needs will be supported Outcome: Progressing   Problem: Respiratory: Goal: Complications related to the disease process, condition or treatment will be avoided or minimized Outcome: Progressing   Problem: Clinical Measurements: Goal: Ability to maintain clinical measurements within normal limits will improve Outcome: Progressing Goal: Respiratory complications will improve Outcome: Progressing

## 2020-06-10 NOTE — Progress Notes (Addendum)
eLink Physician-Brief Progress Note Patient Name: Brian Mack DOB: [birth year] MRN: [id number]   Date of Service  06/10/2020  HPI/Events of Note  Nursing concern about suctioning a small amount of blood from airway. Last platelet count = 213.  eICU Interventions  Plan: 1. H/H, PT/INR and PTT STAT.  2. Portable CXR STAT.     Intervention Category Major Interventions: Other:  Lenell Antu 06/10/2020, 9:08 PM

## 2020-06-10 NOTE — Progress Notes (Signed)
NAME:  Brian Mack, MRN:  [id number], DOB:  June 20, 1986, LOS: 26 ADMISSION DATE:  06/01/2020, CONSULTATION DATE:  9/2 REFERRING MD:  Lorin Mercy, CHIEF COMPLAINT:  Dyspnea   Brief History   34 y/o male admitted on 9/2 with COVID pneumonia after testing positive on 8/26.  PCCM consulted for admission on 9/3, treated by [facility] on progressive care, then condition worsened by 9/9 and he was eventually intubated on 9/11.   Past Medical History  Obesity  Significant Hospital Events   9/15 off NO 9/17 brady event, hypothermic  Consults:   Procedures:  ETT 9/11 > 9/26 CVC 9/11 > 9/23 Left A-line 9/12 > 9/25 3L left PICC 9/20 > Right A-lin 9/26 >  Trach 9/26 >   Significant Diagnostic Tests:  9/5 CT abd/pelvis > mild right hydrouteronephrosis with stone in mid ureter, diffuse pulmonary parenchymal findings consistent with pneumonia, suspected gallstone without acute cholecystitis, prior chronic inflammatory changes in distal ileum and ascending colon.  9/7 Echo> LVEF 60-65%, RV size, function normal, valves OK 9/11 Echo> LVEF 70-75%, RV size, function is normal, valves OK  Micro Data:  8/26 SARS COV 2 > positive 9/2 blood > negative 9/11 resp > negative 9/11 blood > negative 9/19 respiratory culture > canceled   Antimicrobials:  Ceftriaxone 9/2>> 9/6 Azithromycin 9/2>>  9/6 Cefepime 9/11>9/14 vanc 9/11>9/14 Eraxis 9/11>9/13  Interim history/subjective:  Lying in bed sedated, will open eyes to verbal stimuli, following commands, nods  No acute events overnight, weaned to 50%,  Diuresed well 9/27, +2200 ( 2700 out last 24)  Objective   Blood pressure (!) 183/72, pulse 97, temperature (!) 100.8 F (38.2 C), temperature source Axillary, resp. rate (!) 22, height _0  (1.753 m), weight 110.2 kg, SpO2 91 %.    Vent Mode: PRVC FiO2 (%):  [40 %-60 %] 50 % Set Rate:  [32 bmp] 32 bmp Vt Set:  [420 mL] 420 mL PEEP:  [8 cmH20-10 cmH20] 10 cmH20 Plateau Pressure:  [22 cmH20-29  cmH20] 29 cmH20   Intake/Output Summary (Last 24 hours) at 06/10/2020 1053 Last data filed at 06/10/2020 1047 Gross per 24 hour  Intake 3497.8 ml  Output 3085 ml  Net 412.8 ml   Filed Weights   06/08/20 0500 06/09/20 0324 06/10/20 0500  Weight: 108.2 kg 107.1 kg 110.2 kg    Examination: General: Acute ill-appearing well-developed adult gentleman lying in bed in no acute distress HEENT: 8 cuffed Shiley trach secure and intact, MM pink/moist, PERRL,  Neuro: Sedated on ventilator, will open eyes to verbal stimuli, nods head to simple questions CV: s1s2 regular rate and rhythm, no murmur, rubs, or gallops,  PULM: Mechanical breath sounds, tolerating vent well, no added breath sounds, FiO2 50% GI: soft, bowel sounds active in all 4 quadrants, non-tender, non-distended, tolerating TF Extremities: warm/dry, generalized nonpitting edema  Skin: no rashes or lesions,,warm dry and intact  Resolved Hospital Problem list   Precedex induced bradycardia AKI Hypokalemia/hypophosphatemia  Assessment & Plan:  Acute hypoxic/hypercapnic respiratory failure due to ARDS from COVID-19 pneumonia  -Continues to met criteria for moderate ARDS with P/F ratio of 148 (mortality of 32%)  -Completed baricitinib 9/13  Remdisivir 9/2 > 9/3  Acute respiratory acidosis with compensated metabolic alkalosis  -Patient continue to have PCO2 in the 78.4 pH 7.25 9/27 on 9/27.  No ABG 9/28 P: Continue ventilator support with lung protective strategies per ARDS protocol  Wean PEEP and FiO2 for sats greater than 88%. Permissive hypercapnia, pH goal 7.2 or better. Head  of bed elevated 30 degrees. Plateau pressures less than 30 cm H20, currently 29 Driving pressure goal less than 15, currently 19 Follow intermittent chest x-ray and ABG Hold SAT/SBT until sedation can be decreased  Ensure adequate pulmonary hygiene  Follow cultures  VAP bundle in place  PAD protocol; continuous Dilaudid,  and versed. ketamine off Off  continuous NMB, last PRN 9/26 RASS goal -3 Continue scheduled Diamox Continue steroid taper   Hypertension Mild tachycardia -Not on antihypertensives at baseline  P: Continue Norvasc and Clonidine Start Coreg PRN hydralazine and Labetalol Wean Ketamine   Prediabetes with hyperglycemia due to steroid -Patient hemoglobin A1c 6.4 P: Continue SSI Blood sugars well controlled   Hypernatremia, improved P: Continue free water , but decrease to Q 6 Trend Bmet    At risk malnutrition P: Continue TF  Best practice:  Diet: tube feeding Pain/Anxiety/Delirium protocol (if indicated): as above VAP protocol (if indicated): yes DVT prophylaxis: lovenox GI prophylaxis: Pantoprazole for stress ulcer prophylaxis Glucose control: SSI Mobility: bed rest Code Status: full Family Communication: We will update patient's family 9/28 Disposition: remain in ICU  Labs   CBC: Recent Labs  Lab 06/06/20 0400 06/06/20 1946 06/08/20 0724 06/08/20 0724 06/08/20 1716 06/08/20 1832 06/09/20 0138 06/09/20 0323 06/10/20 0358  WBC 13.0*  --  10.4  --   --   --   --  14.2* 11.8*  NEUTROABS  --   --  6.9  --   --   --   --  8.5* 7.8*  HGB 10.1*   < > 9.6*   < > 10.9* 10.5* 11.2* 11.1* 10.1*  HCT 35.8*   < > 33.0*   < > 32.0* 31.0* 33.0* 38.7* 34.8*  MCV 96.5  --  98.2  --   --   --   --  97.7 97.2  PLT 136*  --  156  --   --   --   --  219 213   < > = values in this interval not displayed.    Basic Metabolic Panel: Recent Labs  Lab 06/06/20 0400 06/06/20 1946 06/07/20 0424 06/07/20 1820 06/08/20 0724 06/08/20 0724 06/08/20 1716 06/08/20 1832 06/09/20 0138 06/09/20 0323 06/10/20 0358  NA 149*   < > 149*   < > 148*   < > 146* 145 144 144 140  K 3.0*   < > 4.0   < > 4.0   < > 4.4 4.4 4.1 4.3 4.0  CL 105  --  106  --  111  --   --   --   --  105 101  CO2 35*  --  38*  --  29  --   --   --   --  32 30  GLUCOSE 165*  --  157*  --  85  --   --   --   --  148* 119*  BUN 32*  --  35*   --  30*  --   --   --   --  32* 30*  CREATININE 0.71  --  0.77  --  0.80  --   --   --   --  0.91 0.78  CALCIUM 8.1*  --  8.6*  --  8.1*  --   --   --   --  8.4* 8.3*  MG 2.2  --  2.5*  --   --   --   --   --   --   --   --  PHOS 2.1*  --  3.7  --   --   --   --   --   --   --   --    < > = values in this interval not displayed.   GFR: Estimated Creatinine Clearance: 160.7 mL/min (by C-G formula based on SCr of 0.78 mg/dL). Recent Labs  Lab 06/06/20 0400 06/08/20 0724 06/09/20 0323 06/10/20 0358  WBC 13.0* 10.4 14.2* 11.8*    Liver Function Tests: Recent Labs  Lab 06/06/20 0400 06/07/20 0424  AST 22 34  ALT 34 60*  ALKPHOS 91 218*  BILITOT 0.3 0.4  PROT 4.9* 5.5*  ALBUMIN 2.1* 2.4*   No results for input(s): LIPASE, AMYLASE in the last 168 hours. No results for input(s): AMMONIA in the last 168 hours.  ABG    Component Value Date/Time   PHART 7.259 (L) 06/09/2020 0138   PCO2ART 78.4 (HH) 06/09/2020 0138   PO2ART 74 (L) 06/09/2020 0138   HCO3 35.0 (H) 06/09/2020 0138   TCO2 37 (H) 06/09/2020 0138   O2SAT 91.0 06/09/2020 0138     Coagulation Profile: No results for input(s): INR, PROTIME in the last 168 hours.  Cardiac Enzymes: No results for input(s): CKTOTAL, CKMB, CKMBINDEX, TROPONINI in the last 168 hours.  HbA1C: Hgb A1c MFr Bld  Date/Time Value Ref Range Status  05/25/2020 10:24 AM 6.4 (H) 4.8 - 5.6 % Final    Comment:    (NOTE) Pre diabetes:          5.7%-6.4%  Diabetes:              >6.4%  Glycemic control for   <7.0% adults with diabetes     CBG: Recent Labs  Lab 06/09/20 1538 06/09/20 2003 06/09/20 2330 06/10/20 0344 06/10/20 0738  GLUCAP 148* 90 110* 115* 178*   CRITICAL CARE Performed by: Magdalen Spatz  Total critical care time: 39 minutes  Critical care time was exclusive of separately billable procedures and treating other patients.  Critical care was necessary to treat or prevent imminent or life-threatening  deterioration.  Critical care was time spent personally by me on the following activities: development of treatment plan with patient and/or surrogate as well as nursing, discussions with consultants, evaluation of patient's response to treatment, examination of patient, obtaining history from patient or surrogate, ordering and performing treatments and interventions, ordering and review of laboratory studies, ordering and review of radiographic studies, pulse oximetry and re-evaluation of patient's condition.  Magdalen Spatz, MSN, AGACNP-BC [place] See Amion for personal pager PCCM on call pager [phone] [place] / Pager information can be found on Amion  06/10/2020, 10:53 AM

## 2020-06-11 ENCOUNTER — Inpatient Hospital Stay (HOSPITAL_COMMUNITY): Payer: HRSA Program

## 2020-06-11 DIAGNOSIS — A419 Sepsis, unspecified organism: Secondary | ICD-10-CM

## 2020-06-11 DIAGNOSIS — R6521 Severe sepsis with septic shock: Secondary | ICD-10-CM

## 2020-06-11 LAB — GLUCOSE, CAPILLARY
Glucose-Capillary: 120 mg/dL — ABNORMAL HIGH (ref 70–99)
Glucose-Capillary: 161 mg/dL — ABNORMAL HIGH (ref 70–99)
Glucose-Capillary: 202 mg/dL — ABNORMAL HIGH (ref 70–99)
Glucose-Capillary: 213 mg/dL — ABNORMAL HIGH (ref 70–99)
Glucose-Capillary: 162 mg/dL — ABNORMAL HIGH (ref 70–99)
Glucose-Capillary: 148 mg/dL — ABNORMAL HIGH (ref 70–99)

## 2020-06-11 LAB — POCT I-STAT 7, (LYTES, BLD GAS, ICA,H+H)
Acid-Base Excess: 7 mmol/L — ABNORMAL HIGH (ref 0.0–2.0)
Bicarbonate: 36.6 mmol/L — ABNORMAL HIGH (ref 20.0–28.0)
Calcium, Ion: 1.19 mmol/L (ref 1.15–1.40)
HCT: 30 % — ABNORMAL LOW (ref 39.0–52.0)
Hemoglobin: 10.2 g/dL — ABNORMAL LOW (ref 13.0–17.0)
O2 Saturation: 81 %
Patient temperature: 101
Potassium: 4.5 mmol/L (ref 3.5–5.1)
Sodium: 139 mmol/L (ref 135–145)
TCO2: 39 mmol/L — ABNORMAL HIGH (ref 22–32)
pCO2 arterial: 91 mmHg (ref 32.0–48.0)
pH, Arterial: 7.22 — ABNORMAL LOW (ref 7.350–7.450)
pO2, Arterial: 62 mmHg — ABNORMAL LOW (ref 83.0–108.0)

## 2020-06-11 LAB — COMPREHENSIVE METABOLIC PANEL
ALT: 158 U/L — ABNORMAL HIGH (ref 0–44)
AST: 51 U/L — ABNORMAL HIGH (ref 15–41)
Albumin: 2.2 g/dL — ABNORMAL LOW (ref 3.5–5.0)
Alkaline Phosphatase: 365 U/L — ABNORMAL HIGH (ref 38–126)
Anion gap: 8 (ref 5–15)
BUN: 30 mg/dL — ABNORMAL HIGH (ref 6–20)
CO2: 34 mmol/L — ABNORMAL HIGH (ref 22–32)
Calcium: 8.2 mg/dL — ABNORMAL LOW (ref 8.9–10.3)
Chloride: 99 mmol/L (ref 98–111)
Creatinine, Ser: 0.76 mg/dL (ref 0.61–1.24)
GFR calc Af Amer: >60 mL/min (ref >60)
GFR calc non Af Amer: >60 mL/min (ref >60)
Glucose, Bld: 136 mg/dL — ABNORMAL HIGH (ref 70–99)
Potassium: 4.4 mmol/L (ref 3.5–5.1)
Sodium: 141 mmol/L (ref 135–145)
Total Bilirubin: 0.4 mg/dL (ref 0.3–1.2)
Total Protein: 5.8 g/dL — ABNORMAL LOW (ref 6.5–8.1)

## 2020-06-11 LAB — CBC WITH DIFFERENTIAL/PLATELET
Abs Immature Granulocytes: 0.28 10*3/uL — ABNORMAL HIGH (ref 0.00–0.07)
Basophils Absolute: 0.1 10*3/uL (ref 0.0–0.1)
Basophils Relative: 0 %
Eosinophils Absolute: 0.4 10*3/uL (ref 0.0–0.5)
Eosinophils Relative: 4 %
HCT: 33.8 % — ABNORMAL LOW (ref 39.0–52.0)
Hemoglobin: 9.8 g/dL — ABNORMAL LOW (ref 13.0–17.0)
Immature Granulocytes: 2 %
Lymphocytes Relative: 8 %
Lymphs Abs: 1 10*3/uL (ref 0.7–4.0)
MCH: 27.8 pg (ref 26.0–34.0)
MCHC: 29 g/dL — ABNORMAL LOW (ref 30.0–36.0)
MCV: 96 fL (ref 80.0–100.0)
Monocytes Absolute: 1.3 10*3/uL — ABNORMAL HIGH (ref 0.1–1.0)
Monocytes Relative: 11 %
Neutro Abs: 9.2 10*3/uL — ABNORMAL HIGH (ref 1.7–7.7)
Neutrophils Relative %: 75 %
Platelets: 214 10*3/uL (ref 150–400)
RBC: 3.52 MIL/uL — ABNORMAL LOW (ref 4.22–5.81)
RDW: 16 % — ABNORMAL HIGH (ref 11.5–15.5)
WBC: 12.2 10*3/uL — ABNORMAL HIGH (ref 4.0–10.5)
nRBC: 0.9 % — ABNORMAL HIGH (ref 0.0–0.2)

## 2020-06-11 LAB — BLOOD GAS, ARTERIAL
Acid-Base Excess: 4.7 mmol/L — ABNORMAL HIGH (ref 0.0–2.0)
Bicarbonate: 33.3 mmol/L — ABNORMAL HIGH (ref 20.0–28.0)
Drawn by: 441371
FIO2: 100
O2 Saturation: 86.9 %
Patient temperature: 37.9
pCO2 arterial: 107 mmHg (ref 32.0–48.0)
pH, Arterial: 7.128 — CL (ref 7.350–7.450)
pO2, Arterial: 63 mmHg — ABNORMAL LOW (ref 83.0–108.0)

## 2020-06-11 LAB — D-DIMER, QUANTITATIVE: D-Dimer, Quant: 4.16 ug/mL-FEU — ABNORMAL HIGH (ref 0.00–0.50)

## 2020-06-11 LAB — TRIGLYCERIDES: Triglycerides: 242 mg/dL — ABNORMAL HIGH (ref <150)

## 2020-06-11 LAB — MAGNESIUM: Magnesium: 2 mg/dL (ref 1.7–2.4)

## 2020-06-11 MED ORDER — PROPOFOL 1000 MG/100ML IV EMUL
5.0000 ug/kg/min | INTRAVENOUS | Status: DC
  Administered 2020-06-11: 34.936 ug/kg/min via INTRAVENOUS
  Administered 2020-06-11: 40 ug/kg/min via INTRAVENOUS
  Administered 2020-06-11: 35 ug/kg/min via INTRAVENOUS
  Administered 2020-06-11: 40 ug/kg/min via INTRAVENOUS
  Administered 2020-06-11: 39.927 ug/kg/min via INTRAVENOUS
  Administered 2020-06-12 (×3): 40 ug/kg/min via INTRAVENOUS
  Filled 2020-06-11 (×8): qty 100

## 2020-06-11 MED ORDER — VANCOMYCIN HCL 2000 MG/400ML IV SOLN
2000.0000 mg | Freq: Once | INTRAVENOUS | Status: AC
  Administered 2020-06-11: 2000 mg via INTRAVENOUS
  Filled 2020-06-11: qty 400

## 2020-06-11 MED ORDER — SODIUM CHLORIDE 0.9 % IV SOLN
0.0000 ug/kg/min | INTRAVENOUS | Status: DC
  Administered 2020-06-11: 3 ug/kg/min via INTRAVENOUS
  Administered 2020-06-11: 1.5 ug/kg/min via INTRAVENOUS
  Administered 2020-06-12: 2 ug/kg/min via INTRAVENOUS
  Administered 2020-06-12 – 2020-06-15 (×7): 3 ug/kg/min via INTRAVENOUS
  Filled 2020-06-11 (×12): qty 20

## 2020-06-11 MED ORDER — VANCOMYCIN HCL IN DEXTROSE 1-5 GM/200ML-% IV SOLN
1000.0000 mg | Freq: Three times a day (TID) | INTRAVENOUS | Status: DC
  Administered 2020-06-11 – 2020-06-12 (×3): 1000 mg via INTRAVENOUS
  Filled 2020-06-11 (×3): qty 200

## 2020-06-11 MED ORDER — PROPOFOL 1000 MG/100ML IV EMUL
INTRAVENOUS | Status: AC
  Administered 2020-06-11: 5 ug/kg/min via INTRAVENOUS
  Filled 2020-06-11: qty 100

## 2020-06-11 MED ORDER — NOREPINEPHRINE 4 MG/250ML-% IV SOLN
INTRAVENOUS | Status: AC
  Administered 2020-06-11: 2 ug/min via INTRAVENOUS
  Filled 2020-06-11: qty 250

## 2020-06-11 MED ORDER — ARTIFICIAL TEARS OPHTHALMIC OINT
1.0000 "application " | TOPICAL_OINTMENT | Freq: Three times a day (TID) | OPHTHALMIC | Status: DC
  Administered 2020-06-11 – 2020-06-15 (×14): 1 via OPHTHALMIC
  Filled 2020-06-11 (×2): qty 3.5

## 2020-06-11 MED ORDER — CISATRACURIUM BOLUS VIA INFUSION
5.0000 mg | Freq: Once | INTRAVENOUS | Status: AC
  Administered 2020-06-11: 5 mg via INTRAVENOUS
  Filled 2020-06-11: qty 5

## 2020-06-11 MED ORDER — SODIUM CHLORIDE 0.9 % IV SOLN
2.0000 g | Freq: Three times a day (TID) | INTRAVENOUS | Status: DC
  Administered 2020-06-11 – 2020-06-13 (×6): 2 g via INTRAVENOUS
  Filled 2020-06-11 (×6): qty 2

## 2020-06-11 MED ORDER — NOREPINEPHRINE 4 MG/250ML-% IV SOLN
0.0000 ug/min | INTRAVENOUS | Status: DC
  Administered 2020-06-11: 5 ug/min via INTRAVENOUS
  Filled 2020-06-11: qty 250

## 2020-06-11 NOTE — Progress Notes (Addendum)
NAME:  Brian Mack, MRN:  [id number], DOB:  Jan 13, 1986, LOS: 32 ADMISSION DATE:  06/10/2020, CONSULTATION DATE:  9/2 REFERRING MD:  Lorin Mercy, CHIEF COMPLAINT:  Dyspnea   Brief History   34 y/o male admitted on 9/2 with COVID pneumonia after testing positive on 8/26.  PCCM consulted for admission on 9/3, treated by [facility] on progressive care, then condition worsened by 9/9 and he was eventually intubated on 9/11.   Past Medical History  Obesity  Significant Hospital Events   9/15 off NO 9/17 brady event, hypothermic  Consults:   Procedures:  ETT 9/11 > 9/26 CVC 9/11 > 9/23 Left A-line 9/12 > 9/25 3L left PICC 9/20 > Right A-lin 9/26 >  Trach 9/26 >   Significant Diagnostic Tests:  9/5 CT abd/pelvis > mild right hydrouteronephrosis with stone in mid ureter, diffuse pulmonary parenchymal findings consistent with pneumonia, suspected gallstone without acute cholecystitis, prior chronic inflammatory changes in distal ileum and ascending colon.  9/7 Echo> LVEF 60-65%, RV size, function normal, valves OK 9/11 Echo> LVEF 70-75%, RV size, function is normal, valves OK  Micro Data:  8/26 SARS COV 2 > positive 9/2 blood > negative 9/11 resp > negative 9/11 blood > negative 9/19 respiratory culture > canceled   Antimicrobials:  Ceftriaxone 9/2>> 9/6 Azithromycin 9/2>>  9/6 Cefepime 9/11>9/14 vanc 9/11>9/14 Eraxis 9/11>9/13  Interim history/subjective:  Desaturations during the day yesterday resulting in higher doses of sedation and neuromuscular blockade being required. Hypotensive this morning after Coreg dose and required pressors .  Objective   Blood pressure (!) 127/58, pulse 91, temperature 100.1 F (37.8 C), temperature source Axillary, resp. rate (!) 0, height $RemoveB'5\' 9"'xBonoowB$  (1.753 m), weight 111.6 kg, SpO2 (!) 83 %.    Vent Mode: PRVC FiO2 (%):  [80 %-100 %] 100 % Set Rate:  [32 bmp] 32 bmp Vt Set:  [420 mL] 420 mL PEEP:  [10 cmH20-12 cmH20] 12 cmH20 Plateau Pressure:  [41  cmH20] 41 cmH20   Intake/Output Summary (Last 24 hours) at 06/11/2020 0914 Last data filed at 06/11/2020 0900 Gross per 24 hour  Intake 2211.23 ml  Output 2825 ml  Net -613.77 ml   Filed Weights   06/09/20 0324 06/10/20 0500 06/11/20 0500  Weight: 107.1 kg 110.2 kg 111.6 kg    Examination: General: young adult male, obese, on vent.  HEENT: 8 cuffed Shiley trach secure and intact, MM pink/moist, PERRL,  Neuro: sedated and paralyzed.  CV: RRR, no MRG. LV hyperdynamic on POCUS.  PULM: Clear bilateral breath sounds.  GI: Soft, non-tender, non-distended Extremities: warm/dry, generalized nonpitting edema  Skin: no rashes or lesions,,warm dry and intact  Resolved Hospital Problem list   Precedex induced bradycardia AKI Hypokalemia/hypophosphatemia  Assessment & Plan:  Acute hypoxic/hypercapnic respiratory failure due to ARDS from COVID-19 pneumonia  -Continues to met criteria for moderate ARDS with P/F ratio of 148 (mortality of 32%)  -Completed baricitinib 9/13  Remdisivir 9/2 > 9/3  P: Continue ventilator support with lung protective strategies per ARDS protocol  Wean PEEP and FiO2 for sats greater than 88%. Plateau pressures less than 30 cm H20 Driving pressure goal less than 15 VAP bundle in place  PAD protocol; Propofol, dilaudid, ketamine.  Wean off prop with elevated triglycerides. May need to add back versed to achieve this.  Restarted on NMB overnight.  Scheduled methadone, oxycodone Holding clonidine (Hypotension) RASS goal -5 Hold diamox (hypotension) Continue steroid taper   Hypertension Mild tachycardia -Not on antihypertensives at baseline  P: DC  coreg, norvasc  Hypotension: sudden drop in BP 4/09 am to systolics in the 81X (from 130s). This is shortly after his new dose of coreg. Also on increased sedation. Temp and WBC stable. CXR unchanged. Urine clear. LV hyperdynamic on bedside ultrasound. Good lung sliding. RV appears under filled, certainly not  dilated. Hopeful this is just a medication effect vs hypovolemia, although the sudden onset is concerning.  - levophed - Empiric antibiotics until sepsis can be ruled out.  - Cultures.  Prediabetes with hyperglycemia due to steroid -Patient hemoglobin A1c 6.4 P: Continue SSI Blood sugars well controlled   Hypernatremia, improved P: Continue free water , but decrease to Q 6 Trend Bmet    At risk malnutrition P: Continue TF  Best practice:  Diet: tube feeding Pain/Anxiety/Delirium protocol (if indicated): as above VAP protocol (if indicated): yes DVT prophylaxis: lovenox GI prophylaxis: Pantoprazole for stress ulcer prophylaxis Glucose control: SSI Mobility: bed rest Code Status: full Family Communication: Will call family for update.  Disposition: remain in ICU  Labs   CBC: Recent Labs  Lab 06/06/20 0400 06/06/20 1946 06/08/20 0724 06/08/20 1716 06/09/20 0323 06/10/20 0358 06/10/20 2130 06/11/20 0150 06/11/20 0310  WBC 13.0*  --  10.4  --  14.2* 11.8*  --   --  12.2*  NEUTROABS  --   --  6.9  --  8.5* 7.8*  --   --  9.2*  HGB 10.1*   < > 9.6*   < > 11.1* 10.1* 10.1* 10.2* 9.8*  HCT 35.8*   < > 33.0*   < > 38.7* 34.8* 34.6* 30.0* 33.8*  MCV 96.5  --  98.2  --  97.7 97.2  --   --  96.0  PLT 136*  --  156  --  219 213  --   --  214   < > = values in this interval not displayed.    Basic Metabolic Panel: Recent Labs  Lab 06/06/20 0400 06/06/20 1946 06/07/20 0424 06/07/20 1820 06/08/20 0724 06/08/20 1716 06/09/20 0138 06/09/20 0323 06/10/20 0358 06/11/20 0150 06/11/20 0310  NA 149*   < > 149*   < > 148*   < > 144 144 140 139 141  K 3.0*   < > 4.0   < > 4.0   < > 4.1 4.3 4.0 4.5 4.4  CL 105  --  106  --  111  --   --  105 101  --  99  CO2 35*  --  38*  --  29  --   --  32 30  --  34*  GLUCOSE 165*  --  157*  --  85  --   --  148* 119*  --  136*  BUN 32*  --  35*  --  30*  --   --  32* 30*  --  30*  CREATININE 0.71  --  0.77  --  0.80  --   --  0.91  0.78  --  0.76  CALCIUM 8.1*  --  8.6*  --  8.1*  --   --  8.4* 8.3*  --  8.2*  MG 2.2  --  2.5*  --   --   --   --   --   --   --  2.0  PHOS 2.1*  --  3.7  --   --   --   --   --   --   --   --    < > =  values in this interval not displayed.   GFR: Estimated Creatinine Clearance: 161.8 mL/min (by C-G formula based on SCr of 0.76 mg/dL). Recent Labs  Lab 06/08/20 0724 06/09/20 0323 06/10/20 0358 06/11/20 0310  WBC 10.4 14.2* 11.8* 12.2*    Liver Function Tests: Recent Labs  Lab 06/06/20 0400 06/07/20 0424 06/11/20 0310  AST 22 34 51*  ALT 34 60* 158*  ALKPHOS 91 218* 365*  BILITOT 0.3 0.4 0.4  PROT 4.9* 5.5* 5.8*  ALBUMIN 2.1* 2.4* 2.2*   No results for input(s): LIPASE, AMYLASE in the last 168 hours. No results for input(s): AMMONIA in the last 168 hours.  ABG    Component Value Date/Time   PHART 7.220 (L) 06/11/2020 0150   PCO2ART 91.0 (HH) 06/11/2020 0150   PO2ART 62 (L) 06/11/2020 0150   HCO3 36.6 (H) 06/11/2020 0150   TCO2 39 (H) 06/11/2020 0150   O2SAT 81.0 06/11/2020 0150     Coagulation Profile: Recent Labs  Lab 06/10/20 2130  INR 1.1    Cardiac Enzymes: No results for input(s): CKTOTAL, CKMB, CKMBINDEX, TROPONINI in the last 168 hours.  HbA1C: Hgb A1c MFr Bld  Date/Time Value Ref Range Status  05/25/2020 10:24 AM 6.4 (H) 4.8 - 5.6 % Final    Comment:    (NOTE) Pre diabetes:          5.7%-6.4%  Diabetes:              >6.4%  Glycemic control for   <7.0% adults with diabetes     CBG: Recent Labs  Lab 06/10/20 1526 06/10/20 1951 06/10/20 2351 06/11/20 0349 06/11/20 0757  GLUCAP 166* 163* 142* 120* 161*   CRITICAL CARE Critical care time 40 minutes   Georgann Housekeeper, AGACNP-BC [place] for personal pager PCCM on call pager [phone]  06/11/2020 9:38 AM

## 2020-06-11 NOTE — Progress Notes (Signed)
375 mL IV Ketamine wasted in stericycle with Burnett Harry RN

## 2020-06-11 NOTE — Progress Notes (Signed)
Pharmacy Antibiotic Note  Brian Mack is a 34 y.o. male admitted on 05/14/2020 with rule out for sepsis.  Pharmacy has been consulted for cefepime and vancomycin dosing. Patient fevered overnight with a Tmax 102 and hypotension requiring pressor support. WBC is elevated at 12.2. Patient will be started on empiric antibiotics. Blood and urine cultures sent as well as MRSA PCR.   Plan: Vancomycin 2000mg  x1 followed by vancomycin 1000mg  Q8H  Cefepime 2G Q8H  Vanc levels as needed  Follow up culture data and renal function for therapy adjustments.   Height: 5\' 9"  (175.3 cm) Weight: 111.6 kg (246 lb 0.5 oz) IBW/kg (Calculated) : 70.7  Temp (24hrs), Avg:100.8 F (38.2 C), Min:99.8 F (37.7 C), Max:102 F (38.9 C)  Recent Labs  Lab 06/06/20 0400 06/06/20 0400 06/07/20 0424 06/08/20 0724 06/09/20 0323 06/10/20 0358 06/11/20 0310  WBC 13.0*  --   --  10.4 14.2* 11.8* 12.2*  CREATININE 0.71   < > 0.77 0.80 0.91 0.78 0.76   < > = values in this interval not displayed.    Estimated Creatinine Clearance: 161.8 mL/min (by C-G formula based on SCr of 0.76 mg/dL).    No Known Allergies  Antimicrobials this admission: CTX 9/2 >>9/6 Azith 9/2 >> 9/6 Cefepime 9/11 >> 9/14 Vanc 9/11 >> 9/14 Eraxis 9/11 >> 9/13 Cefepime 9/19 >> 9/22 Vanc 9/19 >> 9/21  Microbiology results: 8/26 COVID+ (outpt) 9/2 BCx - negative 9/7 MRSA PCR - negative 9/11 TA - negative 9/11 BCx - NGTD 9/29 RCx >> 9/29 Bcx >> 9/29 Ucx >> Thank you for allowing pharmacy to be a part of this patient's care.  10/29, PharmD, MBA Pharmacy Resident [phone] 06/11/2020 10:02 AM

## 2020-06-12 DIAGNOSIS — J1282 Pneumonia due to coronavirus disease 2019: Secondary | ICD-10-CM

## 2020-06-12 DIAGNOSIS — J9602 Acute respiratory failure with hypercapnia: Secondary | ICD-10-CM

## 2020-06-12 DIAGNOSIS — U071 COVID-19: Principal | ICD-10-CM

## 2020-06-12 DIAGNOSIS — N179 Acute kidney failure, unspecified: Secondary | ICD-10-CM

## 2020-06-12 DIAGNOSIS — J80 Acute respiratory distress syndrome: Secondary | ICD-10-CM

## 2020-06-12 LAB — CBC WITH DIFFERENTIAL/PLATELET
Abs Immature Granulocytes: 0.21 10*3/uL — ABNORMAL HIGH (ref 0.00–0.07)
Basophils Absolute: 0 10*3/uL (ref 0.0–0.1)
Basophils Relative: 0 %
Eosinophils Absolute: 0.9 10*3/uL — ABNORMAL HIGH (ref 0.0–0.5)
Eosinophils Relative: 8 %
HCT: 32.2 % — ABNORMAL LOW (ref 39.0–52.0)
Hemoglobin: 9.3 g/dL — ABNORMAL LOW (ref 13.0–17.0)
Immature Granulocytes: 2 %
Lymphocytes Relative: 9 %
Lymphs Abs: 0.9 10*3/uL (ref 0.7–4.0)
MCH: 28.6 pg (ref 26.0–34.0)
MCHC: 28.9 g/dL — ABNORMAL LOW (ref 30.0–36.0)
MCV: 99.1 fL (ref 80.0–100.0)
Monocytes Absolute: 1.1 10*3/uL — ABNORMAL HIGH (ref 0.1–1.0)
Monocytes Relative: 10 %
Neutro Abs: 7.2 10*3/uL (ref 1.7–7.7)
Neutrophils Relative %: 71 %
Platelets: 208 10*3/uL (ref 150–400)
RBC: 3.25 MIL/uL — ABNORMAL LOW (ref 4.22–5.81)
RDW: 16.2 % — ABNORMAL HIGH (ref 11.5–15.5)
WBC: 10.3 10*3/uL (ref 4.0–10.5)
nRBC: 1.2 % — ABNORMAL HIGH (ref 0.0–0.2)

## 2020-06-12 LAB — BLOOD GAS, ARTERIAL
Acid-Base Excess: 3.2 mmol/L — ABNORMAL HIGH (ref 0.0–2.0)
Bicarbonate: 32.1 mmol/L — ABNORMAL HIGH (ref 20.0–28.0)
Drawn by: 59101
FIO2: 100
O2 Saturation: 87.5 %
Patient temperature: 37.1
pCO2 arterial: 103 mmHg (ref 32.0–48.0)
pH, Arterial: 7.119 — CL (ref 7.350–7.450)
pO2, Arterial: 58.2 mmHg — ABNORMAL LOW (ref 83.0–108.0)

## 2020-06-12 LAB — URINE CULTURE: Culture: NO GROWTH

## 2020-06-12 LAB — BASIC METABOLIC PANEL
Anion gap: 9 (ref 5–15)
BUN: 50 mg/dL — ABNORMAL HIGH (ref 6–20)
CO2: 31 mmol/L (ref 22–32)
Calcium: 8.5 mg/dL — ABNORMAL LOW (ref 8.9–10.3)
Chloride: 97 mmol/L — ABNORMAL LOW (ref 98–111)
Creatinine, Ser: 1.21 mg/dL (ref 0.61–1.24)
GFR calc Af Amer: >60 mL/min (ref >60)
GFR calc non Af Amer: >60 mL/min (ref >60)
Glucose, Bld: 164 mg/dL — ABNORMAL HIGH (ref 70–99)
Potassium: 4.6 mmol/L (ref 3.5–5.1)
Sodium: 137 mmol/L (ref 135–145)

## 2020-06-12 LAB — GLUCOSE, CAPILLARY
Glucose-Capillary: 169 mg/dL — ABNORMAL HIGH (ref 70–99)
Glucose-Capillary: 171 mg/dL — ABNORMAL HIGH (ref 70–99)
Glucose-Capillary: 167 mg/dL — ABNORMAL HIGH (ref 70–99)
Glucose-Capillary: 211 mg/dL — ABNORMAL HIGH (ref 70–99)
Glucose-Capillary: 254 mg/dL — ABNORMAL HIGH (ref 70–99)
Glucose-Capillary: 202 mg/dL — ABNORMAL HIGH (ref 70–99)

## 2020-06-12 MED ORDER — VANCOMYCIN HCL IN DEXTROSE 1-5 GM/200ML-% IV SOLN
1000.0000 mg | Freq: Two times a day (BID) | INTRAVENOUS | Status: DC
  Administered 2020-06-12 – 2020-06-13 (×2): 1000 mg via INTRAVENOUS
  Filled 2020-06-12 (×2): qty 200

## 2020-06-12 NOTE — Progress Notes (Signed)
NAME:  Brian Mack, MRN:  [id number], DOB:  1985-09-26, LOS: 86 ADMISSION DATE:  05/16/2020, CONSULTATION DATE:  9/2 REFERRING MD:  Lorin Mercy, CHIEF COMPLAINT:  Dyspnea   Brief History   34 y/o male admitted on 9/2 with COVID pneumonia after testing positive on 8/26.  PCCM consulted for admission on 9/3, treated by [facility] on progressive care, then condition worsened by 9/9 and he was eventually intubated on 9/11.   Past Medical History  Obesity  Significant Hospital Events   9/15 off NO 9/17 brady event, hypothermic  Consults:   Procedures:  ETT 9/11 > 9/26 CVC 9/11 > 9/23 Left A-line 9/12 > 9/25 3L left PICC 9/20 > Right A-lin 9/26 >  Trach 9/26 >   Significant Diagnostic Tests:  9/5 CT abd/pelvis > mild right hydrouteronephrosis with stone in mid ureter, diffuse pulmonary parenchymal findings consistent with pneumonia, suspected gallstone without acute cholecystitis, prior chronic inflammatory changes in distal ileum and ascending colon.  9/7 Echo> LVEF 60-65%, RV size, function normal, valves OK 9/11 Echo> LVEF 70-75%, RV size, function is normal, valves OK  Micro Data:  8/26 SARS COV 2 > positive 9/2 blood > negative 9/11 resp > negative 9/11 blood > negative 9/19 respiratory culture > canceled   Antimicrobials:  Ceftriaxone 9/2>> 9/6 Azithromycin 9/2>>  9/6 Cefepime 9/11>9/14 vanc 9/11>9/14 Eraxis 9/11>9/13  Interim history/subjective:  PEEP decreased overnight due to high peak pressures, but as a result driving pressures elevated.   Objective   Blood pressure (!) 117/50, pulse 94, temperature 99 F (37.2 C), temperature source Oral, resp. rate (!) 35, height _0  (1.753 m), weight 113.1 kg, SpO2 90 %.    Vent Mode: PRVC FiO2 (%):  [100 %] 100 % Set Rate:  [35 bmp] 35 bmp Vt Set:  [420 mL] 420 mL PEEP:  [10 cmH20-12 cmH20] 10 cmH20 Plateau Pressure:  [35 cmH20-42 cmH20] 37 cmH20   Intake/Output Summary (Last 24 hours) at 06/12/2020 0911 Last data filed  at 06/12/2020 0800 Gross per 24 hour  Intake 4895.15 ml  Output 1295 ml  Net 3600.15 ml   Filed Weights   06/10/20 0500 06/11/20 0500 06/12/20 0249  Weight: 110.2 kg 111.6 kg 113.1 kg    Examination:  General: obese adult male on vent.  HEENT: 8 cuffed Shiley trach secure and intact, MM pink/moist, PERRL,  Neuro: sedated and paralyzed  CV: RRR, no MRG PULM: Clear bilateral breath sounds GI: Soft, non-tender, non-distended Extremities: no acute deformity, generalized edema. Skin: Grossly intact  Resolved Hospital Problem list   Precedex induced bradycardia AKI Hypokalemia/hypophosphatemia  Assessment & Plan:  Acute hypoxic/hypercapnic respiratory failure due to ARDS from COVID-19 pneumonia  -Continues to met criteria for moderate ARDS with P/F ratio of 148 (mortality of 32%)  -Completed baricitinib 9/13  Remdisivir 9/2 > 9/3  P: Continue ventilator support with lung protective strategies per ARDS protocol  Plateau pressures less than 30 cm H20, Driving pressure goal less than 15, VAP bundle in place  PAD protocol; Propofol, dilaudid, ketamine. Scheduled methadone, oxycodone Wean off prop with elevated triglycerides (242). May need to add back versed to achieve this.  Nimbex restarted 9/29 Holding clonidine, diamox (hypotension) RASS goal -5 Continue steroid taper   Hypertension Mild tachycardia -Not on antihypertensives at baseline  P: DC coreg, norvasc due to hypotension PRNs  Hypotension: sudden drop in BP 9/98 am to systolics in the 33A (from 130s). This is shortly after his new dose of coreg. Also on increased sedation. Temp  and WBC stable. CXR unchanged. Urine clear. LV hyperdynamic on bedside ultrasound. Good lung sliding. RV appears under filled, certainly not dilated. Hopeful this is just a medication effect vs hypovolemia, although the sudden onset is concerning.  - Levophed off - Empiric antibiotics until sepsis can be ruled out.  - Cultures  pending  Prediabetes with hyperglycemia due to steroid -Patient hemoglobin A1c 6.4 P: Continue SSI Blood sugars well controlled   Hypernatremia, improved P: Continue free water , but decrease to Q 6 Trend Bmet    At risk malnutrition P: Continue TF  Prognosis seems poor. Family is aware.   Best practice:  Diet: tube feeding Pain/Anxiety/Delirium protocol (if indicated): as above VAP protocol (if indicated): yes DVT prophylaxis: lovenox GI prophylaxis: Pantoprazole for stress ulcer prophylaxis Glucose control: SSI Mobility: bed rest Code Status: full Family Communication: Will call family for update.  Disposition: remain in ICU  Labs   CBC: Recent Labs  Lab 06/08/20 0724 06/08/20 1716 06/09/20 0323 06/09/20 0323 06/10/20 0358 06/10/20 2130 06/11/20 0150 06/11/20 0310 06/12/20 0309  WBC 10.4  --  14.2*  --  11.8*  --   --  12.2* 10.3  NEUTROABS 6.9  --  8.5*  --  7.8*  --   --  9.2* 7.2  HGB 9.6*   < > 11.1*   < > 10.1* 10.1* 10.2* 9.8* 9.3*  HCT 33.0*   < > 38.7*   < > 34.8* 34.6* 30.0* 33.8* 32.2*  MCV 98.2  --  97.7  --  97.2  --   --  96.0 99.1  PLT 156  --  219  --  213  --   --  214 208   < > = values in this interval not displayed.    Basic Metabolic Panel: Recent Labs  Lab 06/06/20 0400 06/06/20 1946 06/07/20 0424 06/07/20 1820 06/08/20 0724 06/08/20 1716 06/09/20 0323 06/10/20 0358 06/11/20 0150 06/11/20 0310 06/12/20 0309  NA 149*   < > 149*   < > 148*   < > 144 140 139 141 137  K 3.0*   < > 4.0   < > 4.0   < > 4.3 4.0 4.5 4.4 4.6  CL 105  --  106  --  111  --  105 101  --  99 97*  CO2 35*  --  38*  --  29  --  32 30  --  34* 31  GLUCOSE 165*  --  157*  --  85  --  148* 119*  --  136* 164*  BUN 32*  --  35*  --  30*  --  32* 30*  --  30* 50*  CREATININE 0.71  --  0.77  --  0.80  --  0.91 0.78  --  0.76 1.21  CALCIUM 8.1*  --  8.6*  --  8.1*  --  8.4* 8.3*  --  8.2* 8.5*  MG 2.2  --  2.5*  --   --   --   --   --   --  2.0  --   PHOS  2.1*  --  3.7  --   --   --   --   --   --   --   --    < > = values in this interval not displayed.   GFR: Estimated Creatinine Clearance: 107.7 mL/min (by C-G formula based on SCr of 1.21 mg/dL). Recent Labs  Lab 06/09/20 [phone]  06/10/20 0358 06/11/20 0310 06/12/20 0309  WBC 14.2* 11.8* 12.2* 10.3    Liver Function Tests: Recent Labs  Lab 06/06/20 0400 06/07/20 0424 06/11/20 0310  AST 22 34 51*  ALT 34 60* 158*  ALKPHOS 91 218* 365*  BILITOT 0.3 0.4 0.4  PROT 4.9* 5.5* 5.8*  ALBUMIN 2.1* 2.4* 2.2*   No results for input(s): LIPASE, AMYLASE in the last 168 hours. No results for input(s): AMMONIA in the last 168 hours.  ABG    Component Value Date/Time   PHART 7.119 (LL) 06/12/2020 0440   PCO2ART 103 (HH) 06/12/2020 0440   PO2ART 58.2 (L) 06/12/2020 0440   HCO3 32.1 (H) 06/12/2020 0440   TCO2 39 (H) 06/11/2020 0150   O2SAT 87.5 06/12/2020 0440     Coagulation Profile: Recent Labs  Lab 06/10/20 2130  INR 1.1    Cardiac Enzymes: No results for input(s): CKTOTAL, CKMB, CKMBINDEX, TROPONINI in the last 168 hours.  HbA1C: Hgb A1c MFr Bld  Date/Time Value Ref Range Status  05/25/2020 10:24 AM 6.4 (H) 4.8 - 5.6 % Final    Comment:    (NOTE) Pre diabetes:          5.7%-6.4%  Diabetes:              >6.4%  Glycemic control for   <7.0% adults with diabetes     CBG: Recent Labs  Lab 06/11/20 1527 06/11/20 1919 06/11/20 2321 06/12/20 0305 06/12/20 0810  GLUCAP 213* 162* 148* 169* 171*   CRITICAL CARE Critical care time 35 minutes   Georgann Housekeeper, AGACNP-BC [place] for personal pager PCCM on call pager [phone]  06/12/2020 9:11 AM

## 2020-06-12 NOTE — Progress Notes (Signed)
PHARMACY NOTE:  ANTIMICROBIAL RENAL DOSAGE ADJUSTMENT  Current antimicrobial regimen includes a mismatch between antimicrobial dosage and estimated renal function.  As per policy approved by the Pharmacy & Therapeutics and Medical Executive Committees, the antimicrobial dosage will be adjusted accordingly.  Current antimicrobial dosage:  Vancomycin 1000mg  Q8H   Indication: Sepsis   Renal Function: Scr 1.22, CrCl 107.7 (increase from Scr 0.77 9/29)  Estimated Creatinine Clearance: 107.7 mL/min (by C-G formula based on SCr of 1.21 mg/dL). []      On intermittent HD, scheduled: []      On CRRT    Antimicrobial dosage has been changed to: Vancomycin 1000mg  Q12H  Additional comments: Patient's Scr increased overnight to 1.22. To avoid further affect on renal function, will dose reduce vancomycin to 1000mg  Q12H. Plan to continue empiric coverage for 48 hours per CCM. If continue with empiric antibiotic, monitor changing renal function as well as need for vanc levels.     Thank you for allowing pharmacy to be a part of this patient's care.  10/29, PharmD, MBA Pharmacy Resident [phone] 06/12/2020 11:06 AM

## 2020-06-12 NOTE — Progress Notes (Signed)
Assisted tele visit to patient with family member.  Brian Mack D Brycelyn Gambino, RN   

## 2020-06-12 NOTE — Progress Notes (Signed)
Assisted tele visit to patient with family member.  Denasia Venn McEachran, RN  

## 2020-06-13 ENCOUNTER — Inpatient Hospital Stay (HOSPITAL_COMMUNITY): Payer: HRSA Program

## 2020-06-13 DIAGNOSIS — U071 COVID-19: Secondary | ICD-10-CM

## 2020-06-13 DIAGNOSIS — J9601 Acute respiratory failure with hypoxia: Secondary | ICD-10-CM

## 2020-06-13 LAB — BASIC METABOLIC PANEL
Anion gap: 10 (ref 5–15)
Anion gap: 10 (ref 5–15)
BUN: 88 mg/dL — ABNORMAL HIGH (ref 6–20)
BUN: 100 mg/dL — ABNORMAL HIGH (ref 6–20)
CO2: 29 mmol/L (ref 22–32)
CO2: 29 mmol/L (ref 22–32)
Calcium: 8.7 mg/dL — ABNORMAL LOW (ref 8.9–10.3)
Calcium: 8.5 mg/dL — ABNORMAL LOW (ref 8.9–10.3)
Chloride: 95 mmol/L — ABNORMAL LOW (ref 98–111)
Chloride: 94 mmol/L — ABNORMAL LOW (ref 98–111)
Creatinine, Ser: 2.21 mg/dL — ABNORMAL HIGH (ref 0.61–1.24)
Creatinine, Ser: 2.94 mg/dL — ABNORMAL HIGH (ref 0.61–1.24)
GFR calc Af Amer: 44 mL/min — ABNORMAL LOW (ref >60)
GFR calc Af Amer: 31 mL/min — ABNORMAL LOW (ref >60)
GFR calc non Af Amer: 38 mL/min — ABNORMAL LOW (ref >60)
GFR calc non Af Amer: 27 mL/min — ABNORMAL LOW (ref >60)
Glucose, Bld: 237 mg/dL — ABNORMAL HIGH (ref 70–99)
Glucose, Bld: 288 mg/dL — ABNORMAL HIGH (ref 70–99)
Potassium: 6.3 mmol/L (ref 3.5–5.1)
Potassium: 7.5 mmol/L (ref 3.5–5.1)
Sodium: 134 mmol/L — ABNORMAL LOW (ref 135–145)
Sodium: 133 mmol/L — ABNORMAL LOW (ref 135–145)

## 2020-06-13 LAB — GLUCOSE, CAPILLARY
Glucose-Capillary: 205 mg/dL — ABNORMAL HIGH (ref 70–99)
Glucose-Capillary: 262 mg/dL — ABNORMAL HIGH (ref 70–99)
Glucose-Capillary: 237 mg/dL — ABNORMAL HIGH (ref 70–99)
Glucose-Capillary: 268 mg/dL — ABNORMAL HIGH (ref 70–99)
Glucose-Capillary: 253 mg/dL — ABNORMAL HIGH (ref 70–99)

## 2020-06-13 LAB — BLOOD GAS, ARTERIAL
Acid-base deficit: 2 mmol/L (ref 0.0–2.0)
Bicarbonate: 29.3 mmol/L — ABNORMAL HIGH (ref 20.0–28.0)
FIO2: 100
O2 Saturation: 87.2 %
Patient temperature: 37
pCO2 arterial: >120 mmHg (ref 32.0–48.0)
pH, Arterial: 6.97 — CL (ref 7.350–7.450)
pO2, Arterial: 64.2 mmHg — ABNORMAL LOW (ref 83.0–108.0)

## 2020-06-13 LAB — CBC
HCT: 30.7 % — ABNORMAL LOW (ref 39.0–52.0)
Hemoglobin: 8.8 g/dL — ABNORMAL LOW (ref 13.0–17.0)
MCH: 28.3 pg (ref 26.0–34.0)
MCHC: 28.7 g/dL — ABNORMAL LOW (ref 30.0–36.0)
MCV: 98.7 fL (ref 80.0–100.0)
Platelets: 248 10*3/uL (ref 150–400)
RBC: 3.11 MIL/uL — ABNORMAL LOW (ref 4.22–5.81)
RDW: 15.7 % — ABNORMAL HIGH (ref 11.5–15.5)
WBC: 10.4 10*3/uL (ref 4.0–10.5)
nRBC: 4.9 % — ABNORMAL HIGH (ref 0.0–0.2)

## 2020-06-13 LAB — PHOSPHORUS: Phosphorus: 7.4 mg/dL — ABNORMAL HIGH (ref 2.5–4.6)

## 2020-06-13 LAB — CULTURE, RESPIRATORY W GRAM STAIN: Culture: NORMAL

## 2020-06-13 LAB — MAGNESIUM: Magnesium: 2.7 mg/dL — ABNORMAL HIGH (ref 1.7–2.4)

## 2020-06-13 LAB — NA AND K (SODIUM & POTASSIUM), RAND UR
Potassium Urine: 39 mmol/L
Sodium, Ur: <10 mmol/L

## 2020-06-13 MED ORDER — CALCIUM GLUCONATE 10 % IV SOLN: 1.0000 g | Freq: Once | INTRAVENOUS | Status: DC

## 2020-06-13 MED ORDER — FUROSEMIDE 10 MG/ML IJ SOLN
80.0000 mg | Freq: Once | INTRAMUSCULAR | Status: AC
  Administered 2020-06-13: 80 mg via INTRAVENOUS
  Filled 2020-06-13: qty 8

## 2020-06-13 MED ORDER — SODIUM ZIRCONIUM CYCLOSILICATE 10 G PO PACK
10.0000 g | PACK | Freq: Once | ORAL | Status: AC
  Administered 2020-06-13: 10 g via ORAL
  Filled 2020-06-13: qty 1

## 2020-06-13 MED ORDER — SODIUM BICARBONATE 8.4 % IV SOLN
100.0000 meq | Freq: Once | INTRAVENOUS | Status: AC
  Administered 2020-06-13: 100 meq via INTRAVENOUS

## 2020-06-13 MED ORDER — NOREPINEPHRINE 4 MG/250ML-% IV SOLN
0.0000 ug/min | INTRAVENOUS | Status: DC
  Administered 2020-06-13: 2 ug/min via INTRAVENOUS
  Administered 2020-06-14: 12 ug/min via INTRAVENOUS
  Administered 2020-06-14: 18 ug/min via INTRAVENOUS
  Filled 2020-06-13 (×4): qty 250

## 2020-06-13 MED ORDER — CALCIUM GLUCONATE-NACL 1-0.675 GM/50ML-% IV SOLN
1.0000 g | Freq: Once | INTRAVENOUS | Status: AC
  Administered 2020-06-13: 1000 mg via INTRAVENOUS
  Filled 2020-06-13: qty 50

## 2020-06-13 NOTE — Progress Notes (Signed)
Patient u/o for last 7h , CCM MD at bedside and aware.  Aris Lot, RN

## 2020-06-13 NOTE — Progress Notes (Addendum)
NAME:  Brian Mack, MRN:  [id number], DOB:  01-Feb-1986, LOS: 34 ADMISSION DATE:  06/11/2020, CONSULTATION DATE:  9/2 REFERRING MD:  Lorin Mercy, CHIEF COMPLAINT:  Dyspnea   Brief History   34 y/o male admitted on 9/2 with COVID pneumonia after testing positive on 8/26.  PCCM consulted for admission on 9/3, treated by [facility] on progressive care, then condition worsened by 9/9 and he was eventually intubated on 9/11.   Past Medical History  Obesity  Significant Hospital Events   9/15 off NO 9/17 brady event, hypothermic  Consults:   Procedures:  ETT 9/11 > 9/26 CVC 9/11 > 9/23 Left A-line 9/12 > 9/25 3L left PICC 9/20 > Right A-lin 9/26 >  Trach 9/26 >   Significant Diagnostic Tests:  9/5 CT abd/pelvis > mild right hydrouteronephrosis with stone in mid ureter, diffuse pulmonary parenchymal findings consistent with pneumonia, suspected gallstone without acute cholecystitis, prior chronic inflammatory changes in distal ileum and ascending colon.  9/7 Echo> LVEF 60-65%, RV size, function normal, valves OK 9/11 Echo> LVEF 70-75%, RV size, function is normal, valves OK  Micro Data:  8/26 SARS COV 2 > positive 9/2 blood > negative 9/11 resp > negative 9/11 blood > negative 9/19 respiratory culture  Pan cultures 9/29 >  Antimicrobials:  Ceftriaxone 9/2>> 9/6 Azithromycin 9/2>>  9/6 Cefepime 9/11>9/14 vanc 9/11>9/14 Eraxis 9/11>9/13 Cefepime/vanc 9/29 >10/1  Interim history/subjective:  No acute events overnight.   Objective   Blood pressure (!) 130/58, pulse (!) 103, temperature 97.9 F (36.6 C), temperature source Axillary, resp. rate (!) 0, height $RemoveB'5\' 9"'mBcFAYmq$  (1.753 m), weight 113 kg, SpO2 90 %.    Vent Mode: PRVC FiO2 (%):  [100 %] 100 % Set Rate:  [35 bmp] 35 bmp Vt Set:  [420 mL] 420 mL PEEP:  [10 cmH20] 10 cmH20 Plateau Pressure:  [34 cmH20-41 cmH20] 40 cmH20   Intake/Output Summary (Last 24 hours) at 06/13/2020 0844 Last data filed at 06/13/2020 0700 Gross per 24 hour   Intake 6480.57 ml  Output 815 ml  Net 5665.57 ml   Filed Weights   06/11/20 0500 06/12/20 0249 06/13/20 0109  Weight: 111.6 kg 113.1 kg 113 kg    Examination:  General: obese male on vent via trach HEENT: 8 cuffed Shiley trach secure and intact, MM pink/moist, PERRL,  Neuro: sedated and paralyzed  CV: RRR, no MRG PULM: synchronous with vent. Coarse bases GI: Soft, non-tender, non-distended Extremities: no acute deformity, generalized edema. Skin: Grossly intact  Resolved Hospital Problem list   Precedex induced bradycardia AKI Hypokalemia/hypophosphatemia Hypotension  Assessment & Plan:  Acute hypoxic/hypercapnic respiratory failure due to ARDS from COVID-19 pneumonia  -Continues to met criteria for moderate ARDS with P/F ratio of 148 (mortality of 32%)  -Completed baricitinib 9/13  Remdisivir 9/2 > 9/3  P: Continue ventilator support with lung protective strategies per ARDS protocol  Plateau pressures less than 30 cm H20, Driving pressure goal less than 15, VAP bundle in place  PAD protocol; Propofol, dilaudid, ketamine. Scheduled methadone, oxycodone Wean off prop with elevated triglycerides (242). May need to add back versed to achieve this.  Nimbex restarted 9/29 Holding clonidine, diamox (hypotension) RASS goal -5 Continue steroid taper  DC ABX, hypotension has resolved and cultures so far are negative  Hypertension Mild tachycardia -Not on antihypertensives at baseline  P: DC coreg, norvasc due to hypotension PRNs  Prediabetes with hyperglycemia due to steroid -Patient hemoglobin A1c 6.4 P: Continue SSI Blood sugars well controlled   Hyper K  Hypermag Hyperphos AKI worse 10/1 P: DC free water boluses Lokelma given by EMD DC vanco today, hopefully will help renal function May need to consider diuresis if not Not a good candidate for HD presently  Will trial diuresis If renal failure reaches the extent of what can be treated without HD, will need  to consider a transition to comfort care.  Trend Bmet    At risk malnutrition P: Continue TF  Prognosis poor. Family is aware.   Best practice:  Diet: tube feeding Pain/Anxiety/Delirium protocol (if indicated): as above VAP protocol (if indicated): yes DVT prophylaxis: lovenox GI prophylaxis: Pantoprazole for stress ulcer prophylaxis Glucose control: SSI Mobility: bed rest Code Status: full Family Communication: Call father for updates.  Disposition: remain in ICU  Labs   CBC: Recent Labs  Lab 06/08/20 0724 06/08/20 1716 06/09/20 0323 06/09/20 0323 06/10/20 0358 06/10/20 0358 06/10/20 2130 06/11/20 0150 06/11/20 0310 06/12/20 0309 06/13/20 0408  WBC 10.4  --  14.2*  --  11.8*  --   --   --  12.2* 10.3 10.4  NEUTROABS 6.9  --  8.5*  --  7.8*  --   --   --  9.2* 7.2  --   HGB 9.6*   < > 11.1*   < > 10.1*   < > 10.1* 10.2* 9.8* 9.3* 8.8*  HCT 33.0*   < > 38.7*   < > 34.8*   < > 34.6* 30.0* 33.8* 32.2* 30.7*  MCV 98.2  --  97.7  --  97.2  --   --   --  96.0 99.1 98.7  PLT 156  --  219  --  213  --   --   --  214 208 248   < > = values in this interval not displayed.    Basic Metabolic Panel: Recent Labs  Lab 06/07/20 0424 06/07/20 1820 06/09/20 0323 06/09/20 0323 06/10/20 0358 06/11/20 0150 06/11/20 0310 06/12/20 0309 06/13/20 0408  NA 149*   < > 144   < > 140 139 141 137 134*  K 4.0   < > 4.3   < > 4.0 4.5 4.4 4.6 6.3*  CL 106   < > 105  --  101  --  99 97* 95*  CO2 38*   < > 32  --  30  --  34* 31 29  GLUCOSE 157*   < > 148*  --  119*  --  136* 164* 237*  BUN 35*   < > 32*  --  30*  --  30* 50* 88*  CREATININE 0.77   < > 0.91  --  0.78  --  0.76 1.21 2.21*  CALCIUM 8.6*   < > 8.4*  --  8.3*  --  8.2* 8.5* 8.7*  MG 2.5*  --   --   --   --   --  2.0  --  2.7*  PHOS 3.7  --   --   --   --   --   --   --  7.4*   < > = values in this interval not displayed.   GFR: Estimated Creatinine Clearance: 58.9 mL/min (A) (by C-G formula based on SCr of 2.21 mg/dL  (H)). Recent Labs  Lab 06/10/20 0358 06/11/20 0310 06/12/20 0309 06/13/20 0408  WBC 11.8* 12.2* 10.3 10.4    Liver Function Tests: Recent Labs  Lab 06/07/20 0424 06/11/20 0310  AST 34 51*  ALT 60* 158*  ALKPHOS 218* 365*  BILITOT 0.4 0.4  PROT 5.5* 5.8*  ALBUMIN 2.4* 2.2*   No results for input(s): LIPASE, AMYLASE in the last 168 hours. No results for input(s): AMMONIA in the last 168 hours.  ABG    Component Value Date/Time   PHART 7.119 (LL) 06/12/2020 0440   PCO2ART 103 (HH) 06/12/2020 0440   PO2ART 58.2 (L) 06/12/2020 0440   HCO3 32.1 (H) 06/12/2020 0440   TCO2 39 (H) 06/11/2020 0150   O2SAT 87.5 06/12/2020 0440     Coagulation Profile: Recent Labs  Lab 06/10/20 2130  INR 1.1    Cardiac Enzymes: No results for input(s): CKTOTAL, CKMB, CKMBINDEX, TROPONINI in the last 168 hours.  HbA1C: Hgb A1c MFr Bld  Date/Time Value Ref Range Status  05/25/2020 10:24 AM 6.4 (H) 4.8 - 5.6 % Final    Comment:    (NOTE) Pre diabetes:          5.7%-6.4%  Diabetes:              >6.4%  Glycemic control for   <7.0% adults with diabetes     CBG: Recent Labs  Lab 06/12/20 1534 06/12/20 1947 06/12/20 2319 06/13/20 0346 06/13/20 0801  GLUCAP 211* 254* 202* 205* 262*   CRITICAL CARE Critical care time 40 minutes  Georgann Housekeeper, AGACNP-BC [place] for personal pager PCCM on call pager [phone]  06/13/2020 8:44 AM

## 2020-06-13 NOTE — Progress Notes (Signed)
Assisted tele visit to patient with family member.  Hena Ewalt Ann, RN  

## 2020-06-13 NOTE — Progress Notes (Signed)
CRITICAL VALUE ALERT  Critical Value:  K 7.5  Date & Time Notified:  06/13/2020 @ 1537  Provider Notified: CCM MD on unit and notified  Orders Received/Actions taken: 1g Calcium Gluconate

## 2020-06-13 NOTE — Progress Notes (Addendum)
   06/13/20 1510  Clinical Encounter Type  Visited With Patient and family together  Visit Type Critical Care  Referral From Nurse  Consult/Referral To Chaplain  Spiritual Encounters  Spiritual Needs Emotional;Prayer  Chaplain responded to Charge Nurse [facility] page.  Pt Brian Mack family Brian Mack and Brian Mack was called to come visit.  Comforted, prayed, talked and had moments of laughter reminiscing.  Nurse Brian Mack was very kind and helpful.  (Correction: time was 1730)     [employer] [phone]

## 2020-06-13 NOTE — Progress Notes (Signed)
Girlfriend and support person at bedside in appropriate PPE for visit with patient, father reportedly in [place] at this time. Chaplain notified and at bedside for spiritual support of loved ones. Questions answered to visitors satisfaction.  Aris Lot, RN

## 2020-06-13 NOTE — Progress Notes (Addendum)
eLink Physician-Brief Progress Note Patient Name: Brian Mack DOB: 12/13/1985 MRN: [id number]   Date of Service  06/13/2020  HPI/Events of Note  Notified of MAP 57 mmHg. Synchronous with ventilator on maximal ventilator settings including RR 35. Severe respiratory acidosis on ABG.   eICU Interventions  Patient has a severe respiratory acidosis (last ABG 6.97/>120/64) despite maximal vent settings which is now compromising his hemodynamics.  Will start norepinephrine drip for his hypotension but in the absence of a way to improve his respiratory acidosis his prognosis is unfortunately very poor.     ADDENDUM: - RN requests CMP. Order entered.  ADDENDUM: - K > 7.5. This is being driven by his extremely severe respiratory acidosis for which he is receiving maximal ventilatory support. Will temporize with calcium, bicarb, insulin/dextrose, albuterol. Signed out this critical value to incoming day NP with recommendation to contact family and update them.  Intervention Category Intermediate Interventions: Hypotension - evaluation and management  Janae Bridgeman 06/13/2020, 9:38 PM

## 2020-06-13 NOTE — Progress Notes (Signed)
eLink Physician-Brief Progress Note Patient Name: Brian Mack DOB: [birth year] MRN: [id number]   Date of Service  06/13/2020  HPI/Events of Note  K+ 6.3, Creatinine 2.21  eICU Interventions  Hyperkalemia protocol orders.        [name] [name] 06/13/2020, 5:37 AM

## 2020-06-13 NOTE — Progress Notes (Signed)
CRITICAL VALUE ALERT  Critical Value:  PH 6.970, pCO2 >120  Date & Time Notied:  06/13/2020 @ 1514  Provider Notified: CCM MD at bedside and aware  Orders: 100 mEq sodium bicarb IVP

## 2020-06-13 NOTE — Progress Notes (Signed)
Patient's ABG and BMP was repeated, His ABG showed pH of 6.9 and PCO2 is over 120 with PO2 64.  Patient's respiratory rate on ventilator is 35 with tidal volume 8 cc/kg of ideal body weight with peak pressures are already in 50s.  There is no room to adjust ventilator setting. Also has BMP showed worsening serum creatinine to 2.9 and serum potassium of 7.5.  1 g calcium gluconate and 2 Amps of bicarbonate were given.  Patient's father was updated about condition and offered a visit as patient's condition is deteriorating rapidly.  He stated that he is out of town but patient's girlfriend will visit him.   Overall poor prognosis due to multiorgan failure

## 2020-06-13 DEATH — deceased

## 2020-06-14 DIAGNOSIS — U071 COVID-19: Secondary | ICD-10-CM

## 2020-06-14 DIAGNOSIS — J80 Acute respiratory distress syndrome: Secondary | ICD-10-CM

## 2020-06-14 LAB — GLUCOSE, CAPILLARY
Glucose-Capillary: 240 mg/dL — ABNORMAL HIGH (ref 70–99)
Glucose-Capillary: 246 mg/dL — ABNORMAL HIGH (ref 70–99)
Glucose-Capillary: 249 mg/dL — ABNORMAL HIGH (ref 70–99)
Glucose-Capillary: 251 mg/dL — ABNORMAL HIGH (ref 70–99)
Glucose-Capillary: 272 mg/dL — ABNORMAL HIGH (ref 70–99)
Glucose-Capillary: 294 mg/dL — ABNORMAL HIGH (ref 70–99)
Glucose-Capillary: 300 mg/dL — ABNORMAL HIGH (ref 70–99)

## 2020-06-14 LAB — COMPREHENSIVE METABOLIC PANEL
ALT: 76 U/L — ABNORMAL HIGH (ref 0–44)
AST: 28 U/L (ref 15–41)
Albumin: 2.2 g/dL — ABNORMAL LOW (ref 3.5–5.0)
Alkaline Phosphatase: 265 U/L — ABNORMAL HIGH (ref 38–126)
Anion gap: 14 (ref 5–15)
BUN: 118 mg/dL — ABNORMAL HIGH (ref 6–20)
CO2: 28 mmol/L (ref 22–32)
Calcium: 8.4 mg/dL — ABNORMAL LOW (ref 8.9–10.3)
Chloride: 90 mmol/L — ABNORMAL LOW (ref 98–111)
Creatinine, Ser: 3.79 mg/dL — ABNORMAL HIGH (ref 0.61–1.24)
GFR calc Af Amer: 23 mL/min — ABNORMAL LOW (ref >60)
GFR calc non Af Amer: 20 mL/min — ABNORMAL LOW (ref >60)
Glucose, Bld: 265 mg/dL — ABNORMAL HIGH (ref 70–99)
Potassium: >7.5 mmol/L (ref 3.5–5.1)
Sodium: 132 mmol/L — ABNORMAL LOW (ref 135–145)
Total Bilirubin: 1 mg/dL (ref 0.3–1.2)
Total Protein: 6.1 g/dL — ABNORMAL LOW (ref 6.5–8.1)

## 2020-06-14 LAB — BLOOD GAS, ARTERIAL
Acid-base deficit: 2.6 mmol/L — ABNORMAL HIGH (ref 0.0–2.0)
Bicarbonate: 28.3 mmol/L — ABNORMAL HIGH (ref 20.0–28.0)
Drawn by: 51702
FIO2: 100
O2 Saturation: 87.2 %
Patient temperature: 37.6
pCO2 arterial: >120 mmHg (ref 32.0–48.0)
pH, Arterial: 6.976 — CL (ref 7.350–7.450)
pO2, Arterial: 63.9 mmHg — ABNORMAL LOW (ref 83.0–108.0)

## 2020-06-14 LAB — PATHOLOGIST SMEAR REVIEW

## 2020-06-14 MED ORDER — DEXTROSE 50 % IV SOLN
1.0000 | Freq: Once | INTRAVENOUS | Status: AC
  Administered 2020-06-14: 50 mL via INTRAVENOUS
  Filled 2020-06-14: qty 50

## 2020-06-14 MED ORDER — CALCIUM GLUCONATE 10 % IV SOLN
1.0000 g | Freq: Once | INTRAVENOUS | Status: AC
  Administered 2020-06-14: 1 g via INTRAVENOUS
  Filled 2020-06-14: qty 10

## 2020-06-14 MED ORDER — SODIUM BICARBONATE 8.4 % IV SOLN
50.0000 meq | Freq: Once | INTRAVENOUS | Status: AC
  Administered 2020-06-14: 50 meq via INTRAVENOUS
  Filled 2020-06-14 (×2): qty 50

## 2020-06-14 MED ORDER — DEXTROSE 10 % IV SOLN: Freq: Once | INTRAVENOUS | Status: DC

## 2020-06-14 MED ORDER — NOREPINEPHRINE 16 MG/250ML-% IV SOLN
0.0000 ug/min | INTRAVENOUS | Status: DC
  Administered 2020-06-14: 26 ug/min via INTRAVENOUS
  Administered 2020-06-14: 30 ug/min via INTRAVENOUS
  Administered 2020-06-14: 26 ug/min via INTRAVENOUS
  Administered 2020-06-15: 30 ug/min via INTRAVENOUS
  Filled 2020-06-14 (×4): qty 250

## 2020-06-14 MED ORDER — ALBUTEROL SULFATE (2.5 MG/3ML) 0.083% IN NEBU
10.0000 mg | INHALATION_SOLUTION | Freq: Once | RESPIRATORY_TRACT | Status: AC
  Administered 2020-06-14: 10 mg via RESPIRATORY_TRACT
  Filled 2020-06-14: qty 12

## 2020-06-14 MED ORDER — INSULIN ASPART 100 UNIT/ML IV SOLN
10.0000 [IU] | Freq: Once | INTRAVENOUS | Status: AC
  Administered 2020-06-14: 10 [IU] via INTRAVENOUS

## 2020-06-14 NOTE — Progress Notes (Signed)
Assisted tele visit to patient with family member.  Ellina Sivertsen Ann, RN  

## 2020-06-14 NOTE — Progress Notes (Signed)
Dr.Stretch made aware of patient's blood pressure on max levophed and peaked T waves on telemetry. No new orders at this time

## 2020-06-14 NOTE — Progress Notes (Signed)
Dr. Merrily Pew was made aware that patient's levophed is at maximum rate of 30 mcg/min. No new orders at this time.

## 2020-06-14 NOTE — Progress Notes (Signed)
Dr. Merrily Pew was made aware of ST elevation based on telemetry. No new orders at this time.

## 2020-06-14 NOTE — Progress Notes (Addendum)
NAME:  Brian Mack, MRN:  [id number], DOB:  08/18/1986, LOS: 30 ADMISSION DATE:  05/22/2020, CONSULTATION DATE:  9/2 REFERRING MD:  Ophelia Charter, CHIEF COMPLAINT:  Dyspnea   Brief History   34 y/o male admitted on 9/2 with COVID pneumonia after testing positive on 8/26.  PCCM consulted for admission on 9/3, treated by [facility] on progressive care, then condition worsened by 9/9 and he was eventually intubated on 9/11.   Past Medical History  Obesity  Significant Hospital Events   9/15 off NO 9/17 brady event, hypothermic  Consults:   Procedures:  ETT 9/11 > 9/26 CVC 9/11 > 9/23 Left A-line 9/12 > 9/25 3L left PICC 9/20 > Right A-lin 9/26 >  Trach 9/26 >   Significant Diagnostic Tests:  9/5 CT abd/pelvis > mild right hydrouteronephrosis with stone in mid ureter, diffuse pulmonary parenchymal findings consistent with pneumonia, suspected gallstone without acute cholecystitis, prior chronic inflammatory changes in distal ileum and ascending colon.  9/7 Echo> LVEF 60-65%, RV size, function normal, valves OK 9/11 Echo> LVEF 70-75%, RV size, function is normal, valves OK  Micro Data:  8/26 SARS COV 2 > positive 9/2 blood > negative 9/11 resp > negative 9/11 blood > negative 9/19 respiratory culture  Pan cultures 9/29 >  Antimicrobials:  Ceftriaxone 9/2>> 9/6 Azithromycin 9/2>>  9/6 Cefepime 9/11>9/14 vanc 9/11>9/14 Eraxis 9/11>9/13 Cefepime/vanc 9/29 >10/1  Interim history/subjective:  Patient with severe respiratory acidosis, with PCO2 over 120 and pH of less than seven.  He is getting hypotensive because of vasodilation from acidosis, requiring vasopressor support.  Also with refractory hyperkalemia due to acidosis  Objective   Blood pressure (!) 124/56, pulse (!) 113, temperature 98.8 F (37.1 C), temperature source Oral, resp. rate (!) 34, height 5\' 9"  (1.753 m), weight 112.6 kg, SpO2 91 %.    Vent Mode: PRVC FiO2 (%):  [100 %] 100 % Set Rate:  [35 bmp] 35 bmp Vt Set:   [420 mL] 420 mL PEEP:  [12 cmH20] 12 cmH20 Plateau Pressure:  [33 cmH20-50 cmH20] 39 cmH20   Intake/Output Summary (Last 24 hours) at 06/14/2020 1639 Last data filed at 06/14/2020 1100 Gross per 24 hour  Intake 2943.8 ml  Output 426 ml  Net 2517.8 ml   Filed Weights   06/12/20 0249 06/13/20 0109 06/14/20 0108  Weight: 113.1 kg 113 kg 112.6 kg    Examination:  General: Young Caucasian male, lying on the bed s/p trach HEENT: 8 cuffed Shiley trach secure and intact, MM pink/moist, PERRL,  Neuro: sedated and paralyzed  CV: RRR, no MRG PULM: synchronous with vent. Coarse bases GI: Soft, non-tender, non-distended Extremities: no acute deformity, generalized edema. Skin: Grossly intact  Resolved Hospital Problem list   Precedex induced bradycardia AKI Hypokalemia/hypophosphatemia Hypotension  Assessment & Plan:  Acute hypoxic/hypercapnic respiratory failure due to ARDS from COVID-19 pneumonia  Acute respiratory acidosis Refractory hyperkalemia Hyponatremia Acute kidney injury Circulatory shock, due to refractory acidosis Hypertriglyceridemia  Unfortunately patient is in fibroproliferative stage of ARDS, he is not ventilating well that is leading to inefficient gas exchange, leading to hypoxemia and hypercapnia with severe respiratory acidosis and refractory hyperkalemia. He is on maximum ventilatory support with tidal volume 7 cc/kg of ideal body weight, despite that we are unable to clear hypercapnia.  Patient's peak/plateau and driving pressures are elevated, there is no room to come down on tidal volume due to hypercapnia and respiratory acidosis Patient received multiple rounds of calcium gluconate, Lokelma and sodium bicarbonate He is requiring two  vasopressors with Levophed, despite that his blood pressure map is less than 60. Adding more pressors will not change the outcome, so will hold off further addition of vasopressors. Patient serum creatinine is  worsening Considering he has inefficient gas exchange and refractory respiratory acidosis, starting him on CRRT would not change the outcome Continue deep sedation and paralytic to continue vent synchrony Propofol was stopped due to hypertriglyceridemia, and risk of propofol infusion syndrome considering he has been on high doses for long time Patient's family was updated about patient's worsening condition  Overall poor prognosis due to multiorgan system failure   Best practice:  Diet: tube feeding Pain/Anxiety/Delirium protocol (if indicated): as above VAP protocol (if indicated): yes DVT prophylaxis: lovenox GI prophylaxis: Pantoprazole for stress ulcer prophylaxis Glucose control: SSI Mobility: bed rest Code Status: full Family Communication: Patient's family was updated Disposition: remain in ICU  Labs   CBC: Recent Labs  Lab 06/08/20 0724 06/08/20 1716 06/09/20 0323 06/09/20 0323 06/10/20 0358 06/10/20 0358 06/10/20 2130 06/11/20 0150 06/11/20 0310 06/12/20 0309 06/13/20 0408  WBC 10.4  --  14.2*  --  11.8*  --   --   --  12.2* 10.3 10.4  NEUTROABS 6.9  --  8.5*  --  7.8*  --   --   --  9.2* 7.2  --   HGB 9.6*   < > 11.1*   < > 10.1*   < > 10.1* 10.2* 9.8* 9.3* 8.8*  HCT 33.0*   < > 38.7*   < > 34.8*   < > 34.6* 30.0* 33.8* 32.2* 30.7*  MCV 98.2  --  97.7  --  97.2  --   --   --  96.0 99.1 98.7  PLT 156  --  219  --  213  --   --   --  214 208 248   < > = values in this interval not displayed.    Basic Metabolic Panel: Recent Labs  Lab 06/11/20 0310 06/12/20 0309 06/13/20 0408 06/13/20 1448 06/14/20 0433  NA 141 137 134* 133* 132*  K 4.4 4.6 6.3* 7.5* >7.5*  CL 99 97* 95* 94* 90*  CO2 34* 31 29 29 28   GLUCOSE 136* 164* 237* 288* 265*  BUN 30* 50* 88* 100* 118*  CREATININE 0.76 1.21 2.21* 2.94* 3.79*  CALCIUM 8.2* 8.5* 8.7* 8.5* 8.4*  MG 2.0  --  2.7*  --   --   PHOS  --   --  7.4*  --   --    GFR: Estimated Creatinine Clearance: 34.3 mL/min (A)  (by C-G formula based on SCr of 3.79 mg/dL (H)). Recent Labs  Lab 06/10/20 0358 06/11/20 0310 06/12/20 0309 06/13/20 0408  WBC 11.8* 12.2* 10.3 10.4    Liver Function Tests: Recent Labs  Lab 06/11/20 0310 06/14/20 0433  AST 51* 28  ALT 158* 76*  ALKPHOS 365* 265*  BILITOT 0.4 1.0  PROT 5.8* 6.1*  ALBUMIN 2.2* 2.2*   No results for input(s): LIPASE, AMYLASE in the last 168 hours. No results for input(s): AMMONIA in the last 168 hours.  ABG    Component Value Date/Time   PHART 6.976 (LL) 06/14/2020 0425   PCO2ART >120 (HH) 06/14/2020 0425   PO2ART 63.9 (L) 06/14/2020 0425   HCO3 28.3 (H) 06/14/2020 0425   TCO2 39 (H) 06/11/2020 0150   ACIDBASEDEF 2.6 (H) 06/14/2020 0425   O2SAT 87.2 06/14/2020 0425     Coagulation Profile: Recent Labs  Lab 06/10/20  2130  INR 1.1    Cardiac Enzymes: No results for input(s): CKTOTAL, CKMB, CKMBINDEX, TROPONINI in the last 168 hours.  HbA1C: Hgb A1c MFr Bld  Date/Time Value Ref Range Status  05/25/2020 10:24 AM 6.4 (H) 4.8 - 5.6 % Final    Comment:    (NOTE) Pre diabetes:          5.7%-6.4%  Diabetes:              >6.4%  Glycemic control for   <7.0% adults with diabetes     CBG: Recent Labs  Lab 06/14/20 0336 06/14/20 0812 06/14/20 1110 06/14/20 1148 06/14/20 1516  GLUCAP 240* 272* 300* 294* 249*   Total critical care time: 47 minutes  Performed by: Cheri Fowler   Critical care time was exclusive of separately billable procedures and treating other patients.   Critical care was necessary to treat or prevent imminent or life-threatening deterioration.   Critical care was time spent personally by me on the following activities: development of treatment plan with patient and/or surrogate as well as nursing, discussions with consultants, evaluation of patient's response to treatment, examination of patient, obtaining history from patient or surrogate, ordering and performing treatments and interventions, ordering  and review of laboratory studies, ordering and review of radiographic studies, pulse oximetry and re-evaluation of patient's condition.   Cheri Fowler MD Critical care physician [facility] [place] [facility]  Pager: [phone] Mobile: [phone]

## 2020-06-15 LAB — BLOOD GAS, ARTERIAL
Acid-base deficit: 5.7 mmol/L — ABNORMAL HIGH (ref 0.0–2.0)
Bicarbonate: 24.6 mmol/L (ref 20.0–28.0)
Drawn by: 36277
FIO2: 100
O2 Saturation: 96.3 %
Patient temperature: 37.8
pCO2 arterial: 109 mmHg (ref 32.0–48.0)
pH, Arterial: 6.99 — CL (ref 7.350–7.450)
pO2, Arterial: 112 mmHg — ABNORMAL HIGH (ref 83.0–108.0)

## 2020-06-15 LAB — BASIC METABOLIC PANEL
Anion gap: 14 (ref 5–15)
BUN: 160 mg/dL — ABNORMAL HIGH (ref 6–20)
CO2: 24 mmol/L (ref 22–32)
Calcium: 8 mg/dL — ABNORMAL LOW (ref 8.9–10.3)
Chloride: 94 mmol/L — ABNORMAL LOW (ref 98–111)
Creatinine, Ser: 5.51 mg/dL — ABNORMAL HIGH (ref 0.61–1.24)
GFR calc Af Amer: 14 mL/min — ABNORMAL LOW (ref >60)
GFR calc non Af Amer: 13 mL/min — ABNORMAL LOW (ref >60)
Glucose, Bld: 272 mg/dL — ABNORMAL HIGH (ref 70–99)
Potassium: >7.5 mmol/L (ref 3.5–5.1)
Sodium: 132 mmol/L — ABNORMAL LOW (ref 135–145)

## 2020-06-15 LAB — GLUCOSE, CAPILLARY
Glucose-Capillary: 103 mg/dL — ABNORMAL HIGH (ref 70–99)
Glucose-Capillary: 138 mg/dL — ABNORMAL HIGH (ref 70–99)
Glucose-Capillary: 169 mg/dL — ABNORMAL HIGH (ref 70–99)
Glucose-Capillary: 273 mg/dL — ABNORMAL HIGH (ref 70–99)
Glucose-Capillary: 298 mg/dL — ABNORMAL HIGH (ref 70–99)

## 2020-06-15 MED ORDER — INSULIN ASPART 100 UNIT/ML IV SOLN
10.0000 [IU] | Freq: Once | INTRAVENOUS | Status: AC
  Administered 2020-06-15: 10 [IU] via INTRAVENOUS

## 2020-06-15 MED ORDER — CALCIUM GLUCONATE 10 % IV SOLN
1.0000 g | Freq: Once | INTRAVENOUS | Status: AC
  Administered 2020-06-15: 1 g via INTRAVENOUS
  Filled 2020-06-15: qty 10

## 2020-06-15 MED ORDER — SODIUM ZIRCONIUM CYCLOSILICATE 10 G PO PACK
10.0000 g | PACK | Freq: Once | ORAL | Status: AC
  Administered 2020-06-15: 10 g
  Filled 2020-06-15: qty 1

## 2020-06-15 MED ORDER — SODIUM ZIRCONIUM CYCLOSILICATE 10 G PO PACK
10.0000 g | PACK | Freq: Three times a day (TID) | ORAL | Status: DC
  Administered 2020-06-15 (×2): 10 g
  Filled 2020-06-15 (×3): qty 1

## 2020-06-15 MED ORDER — CALCIUM GLUCONATE-NACL 2-0.675 GM/100ML-% IV SOLN
2.0000 g | Freq: Once | INTRAVENOUS | Status: AC
  Administered 2020-06-15: 2000 mg via INTRAVENOUS
  Filled 2020-06-15: qty 100

## 2020-06-15 MED ORDER — SODIUM BICARBONATE 8.4 % IV SOLN
50.0000 meq | Freq: Once | INTRAVENOUS | Status: AC
  Administered 2020-06-15: 50 meq via INTRAVENOUS
  Filled 2020-06-15: qty 50

## 2020-06-15 MED ORDER — INSULIN ASPART 100 UNIT/ML ~~LOC~~ SOLN
10.0000 [IU] | Freq: Once | SUBCUTANEOUS | Status: AC
  Administered 2020-06-15: 10 [IU] via INTRAVENOUS

## 2020-06-15 MED ORDER — SODIUM BICARBONATE 8.4 % IV SOLN
INTRAVENOUS | Status: AC
  Filled 2020-06-15: qty 100

## 2020-06-15 MED ORDER — DEXTROSE 50 % IV SOLN
50.0000 mL | Freq: Once | INTRAVENOUS | Status: AC
  Administered 2020-06-15: 50 mL via INTRAVENOUS
  Filled 2020-06-15: qty 50

## 2020-06-15 MED ORDER — CALCIUM GLUCONATE-NACL 1-0.675 GM/50ML-% IV SOLN
1.0000 g | Freq: Once | INTRAVENOUS | Status: AC
  Administered 2020-06-15: 1000 mg via INTRAVENOUS
  Filled 2020-06-15: qty 50

## 2020-06-15 MED ORDER — SODIUM BICARBONATE 8.4 % IV SOLN
100.0000 meq | Freq: Once | INTRAVENOUS | Status: AC
  Administered 2020-06-15: 100 meq via INTRAVENOUS

## 2020-06-15 MED ORDER — ALBUTEROL SULFATE (2.5 MG/3ML) 0.083% IN NEBU
10.0000 mg | INHALATION_SOLUTION | Freq: Once | RESPIRATORY_TRACT | Status: AC
  Administered 2020-06-15: 10 mg via RESPIRATORY_TRACT
  Filled 2020-06-15: qty 12

## 2020-06-16 LAB — CULTURE, BLOOD (ROUTINE X 2)
Culture: NO GROWTH
Culture: NO GROWTH
Special Requests: ADEQUATE

## 2020-07-14 NOTE — Progress Notes (Signed)
Assisted tele visit to patient with family member.  Alonso Gapinski Ann, RN  

## 2020-07-14 NOTE — Progress Notes (Signed)
Patient's dad, Giankarlo Leamer and step mother Cordelia Pen were at bedside and decided to make the patient comfort care. Pressors were turned off and patient's time of death was Jun 17, 2020 at 1840.  Patient time of death verified by Bluford Kaufmann, RN and Lezlie Lye, RN.  Dad was given patient placement's business card once the family decides which funeral home the patient will be going to. Patient's belongings were also given to his dad.  Dr. Merrily Pew made aware of time of death.

## 2020-07-14 NOTE — Progress Notes (Signed)
Dr. Merrily Pew was made aware that patient's MAP is in the 40s and HR down to 60's. RN spoke with the patient's father, Chanetta Marshall and updated him on patient's deteriorating status.   Orders for 2 amps of bicarb given.

## 2020-07-14 NOTE — Progress Notes (Signed)
Assisted tele visit to patient with family member.  Fredrik Mogel R, RN  

## 2020-07-14 NOTE — Progress Notes (Signed)
Assisted tele visit to patient with family member.  Denisa Enterline Ann, RN  

## 2020-07-14 NOTE — Progress Notes (Addendum)
eLink Physician-Brief Progress Note Patient Name: Brian Mack DOB: Jan 30, 1986 MRN: [id number]   Date of Service  06/14/2020  HPI/Events of Note  Hyperkalemia.  eICU Interventions  Start Lokelma 10mg  TID Obtain BMP - can repeat insulin/D50, bicarb push, calcium as needed to temporize (goal to enable family time to arrive from ).   ADDENDUM: K > 7.5. Give calcium, insulin, bicarb, albuterol, and continue Lokelma.  Intervention Category Minor Interventions: Electrolytes abnormality - evaluation and management  [name] 06/28/2020, 5:02 AM

## 2020-07-14 NOTE — Progress Notes (Signed)
CRITICAL VALUE ALERT  Critical Value:  K >7.5  Date & Time Notied:  06-29-20 @ 5427  Provider Notified: Pola Corn MD  Orders Received/Actions taken: MD and bedside RN notified

## 2020-07-14 NOTE — Progress Notes (Signed)
Patient's father and brother drove from [place], at bedside.  I explained patient's condition, they opted to keep him DNR and thinking about comfort care.  At this time will change patient's CODE STATUS to DNR and continue management per protocol.    Cheri Fowler MD Critical care physician [facility]  Pager: [phone] Mobile: [phone]

## 2020-07-14 NOTE — Progress Notes (Addendum)
NAME:  Brian Mack, MRN:  [id number], DOB:  08-24-1986, LOS: 31 ADMISSION DATE:  05/25/2020, CONSULTATION DATE:  9/2 REFERRING MD:  Ophelia Charter, CHIEF COMPLAINT:  Dyspnea   Brief History   34 y/o male admitted on 9/2 with COVID pneumonia after testing positive on 8/26.  PCCM consulted for admission on 9/3, treated by [facility] on progressive care, then condition worsened by 9/9 and he was eventually intubated on 9/11.   Past Medical History  Obesity  Significant Hospital Events   9/15 off NO 9/17 brady event, hypothermic  Consults:   Procedures:  ETT 9/11 > 9/26 CVC 9/11 > 9/23 Left A-line 9/12 > 9/25 3L left PICC 9/20 > Right A-lin 9/26 >  Trach 9/26 >   Significant Diagnostic Tests:  9/5 CT abd/pelvis > mild right hydrouteronephrosis with stone in mid ureter, diffuse pulmonary parenchymal findings consistent with pneumonia, suspected gallstone without acute cholecystitis, prior chronic inflammatory changes in distal ileum and ascending colon.  9/7 Echo> LVEF 60-65%, RV size, function normal, valves OK 9/11 Echo> LVEF 70-75%, RV size, function is normal, valves OK  Micro Data:  8/26 SARS COV 2 > positive 9/2 blood > negative 9/11 resp > negative 9/11 blood > negative 9/19 respiratory culture  Pan cultures 9/29 >  Antimicrobials:  Ceftriaxone 9/2>> 9/6 Azithromycin 9/2>>  9/6 Cefepime 9/11>9/14 vanc 9/11>9/14 Eraxis 9/11>9/13 Cefepime/vanc 9/29 >10/1  Interim history/subjective:  Patient is in severe respiratory acidosis with pH <7, refractory hyperkalemia and hypotension, requiring vasopressor.  He went into junctional rhythm with heart rate into 40s which improved with bicarbonate  Objective   Blood pressure (!) 68/42, pulse 66, temperature 98 F (36.7 C), temperature source Oral, resp. rate (!) 0, height 5\' 9"  (1.753 m), weight 114.6 kg, SpO2 (!) 82 %.    Vent Mode: PRVC FiO2 (%):  [100 %] 100 % Set Rate:  [0.5 bmp-35 bmp] 33 bmp Vt Set:  [420 mL] 420 mL PEEP:   [10 cmH20-12 cmH20] 10 cmH20 Plateau Pressure:  [34 cmH20-46 cmH20] 44 cmH20   Intake/Output Summary (Last 24 hours) at July 09, 2020 1802 Last data filed at 2020-07-09 1700 Gross per 24 hour  Intake 2210.51 ml  Output 5 ml  Net 2205.51 ml   Filed Weights   06/13/20 0109 06/14/20 0108 07/09/20 0500  Weight: 113 kg 112.6 kg 114.6 kg    Examination:  General: Young Caucasian male, lying on the bed s/p trach HEENT: 8 cuffed Shiley trach secure and intact, MM pink/moist, PERRL,  Neuro: sedated and paralyzed  CV: RRR, no MRG PULM: synchronous with vent. Coarse bases GI: Soft, non-tender, non-distended Extremities: no acute deformity, generalized edema. Skin: Grossly intact  Resolved Hospital Problem list   Precedex induced bradycardia AKI Hypokalemia/hypophosphatemia Hypotension  Assessment & Plan:  Acute hypoxic/hypercapnic respiratory failure due to ARDS from COVID-19 pneumonia  Acute respiratory acidosis Refractory hyperkalemia Hyponatremia Acute kidney injury Circulatory shock, due to refractory acidosis Hypertriglyceridemia  Patient with fibroproliferative stage of ARDS, he is not ventilating well with inefficient gas exchange, leading to hypoxemia and hypercapnia with severe respiratory acidosis and refractory hyperkalemia. He is on maximum ventilatory support with tidal volume 7 cc/kg of ideal body weight, despite that we are unable to clear hypercapnia.  Patient's peak/plateau and driving pressures are elevated, there is no room to come down on tidal volume due to hypercapnia and respiratory acidosis Patient received multiple rounds of calcium gluconate, Lokelma and sodium bicarbonate IV pushes Patient is on Levophed at 30 mics with map is less  than 60. Adding more pressors will not change the outcome, so will hold off further addition of vasopressors or further increasing the dose Patient serum creatinine is worsening Considering he has inefficient gas exchange and  refractory respiratory acidosis, starting him on CRRT would not change the outcome Continue deep sedation, will stop cisatracurium infusion Propofol was stopped due to hypertriglyceridemia, and risk of propofol infusion syndrome considering he has been on high doses for long time  Patient's father and mother came from [place], condition was explained at bedside.  Patient was made DNR  Addendum: Patient's father called in and stated he would like to keep patient on comfort care only.  I will continue hydromorphone and midazolam drip, will start vasopressors and other medications and will keep him comfortable.  Overall grave prognosis  Best practice:  Diet: NPO Pain/Anxiety/Delirium protocol (if indicated): as above VAP protocol (if indicated): yes DVT prophylaxis: lovenox GI prophylaxis: Pantoprazole for stress ulcer prophylaxis Glucose control: SSI Mobility: bed rest Code Status: DNR/comfort care Family Communication: Patient's family was updated Disposition: remain in ICU  Labs   CBC: Recent Labs  Lab 06/09/20 0323 06/09/20 0323 06/10/20 0358 06/10/20 0358 06/10/20 2130 06/11/20 0150 06/11/20 0310 06/12/20 0309 06/13/20 0408  WBC 14.2*  --  11.8*  --   --   --  12.2* 10.3 10.4  NEUTROABS 8.5*  --  7.8*  --   --   --  9.2* 7.2  --   HGB 11.1*   < > 10.1*   < > 10.1* 10.2* 9.8* 9.3* 8.8*  HCT 38.7*   < > 34.8*   < > 34.6* 30.0* 33.8* 32.2* 30.7*  MCV 97.7  --  97.2  --   --   --  96.0 99.1 98.7  PLT 219  --  213  --   --   --  214 208 248   < > = values in this interval not displayed.    Basic Metabolic Panel: Recent Labs  Lab 06/11/20 0310 06/11/20 0310 06/12/20 0309 06/13/20 0408 06/13/20 1448 06/14/20 0433 13-Jul-2020 0553  NA 141   < > 137 134* 133* 132* 132*  K 4.4   < > 4.6 6.3* 7.5* >7.5* >7.5*  CL 99   < > 97* 95* 94* 90* 94*  CO2 34*   < > 31 29 29 28 24   GLUCOSE 136*   < > 164* 237* 288* 265* 272*  BUN 30*   < > 50* 88* 100* 118* 160*  CREATININE  0.76   < > 1.21 2.21* 2.94* 3.79* 5.51*  CALCIUM 8.2*   < > 8.5* 8.7* 8.5* 8.4* 8.0*  MG 2.0  --   --  2.7*  --   --   --   PHOS  --   --   --  7.4*  --   --   --    < > = values in this interval not displayed.   GFR: Estimated Creatinine Clearance: 23.8 mL/min (A) (by C-G formula based on SCr of 5.51 mg/dL (H)). Recent Labs  Lab 06/10/20 0358 06/11/20 0310 06/12/20 0309 06/13/20 0408  WBC 11.8* 12.2* 10.3 10.4    Liver Function Tests: Recent Labs  Lab 06/11/20 0310 06/14/20 0433  AST 51* 28  ALT 158* 76*  ALKPHOS 365* 265*  BILITOT 0.4 1.0  PROT 5.8* 6.1*  ALBUMIN 2.2* 2.2*   No results for input(s): LIPASE, AMYLASE in the last 168 hours. No results for input(s): AMMONIA in the  last 168 hours.  ABG    Component Value Date/Time   PHART 6.990 (LL) 2020-07-13 0308   PCO2ART 109 (HH) 07-13-2020 0308   PO2ART 112 (H) 07/13/20 0308   HCO3 24.6 07/13/2020 0308   TCO2 39 (H) 06/11/2020 0150   ACIDBASEDEF 5.7 (H) 2020-07-13 0308   O2SAT 96.3 07/13/2020 0308     Coagulation Profile: Recent Labs  Lab 06/10/20 2130  INR 1.1    Cardiac Enzymes: No results for input(s): CKTOTAL, CKMB, CKMBINDEX, TROPONINI in the last 168 hours.  HbA1C: Hgb A1c MFr Bld  Date/Time Value Ref Range Status  05/25/2020 10:24 AM 6.4 (H) 4.8 - 5.6 % Final    Comment:    (NOTE) Pre diabetes:          5.7%-6.4%  Diabetes:              >6.4%  Glycemic control for   <7.0% adults with diabetes     CBG: Recent Labs  Lab 2020/07/13 0004 2020-07-13 0309 07-13-20 0828 07-13-2020 1214 07-13-20 1616  GLUCAP 273* 298* 169* 138* 103*   Total critical care time: 35 minutes  Performed by: Cheri Fowler   Critical care time was exclusive of separately billable procedures and treating other patients.   Critical care was necessary to treat or prevent imminent or life-threatening deterioration.   Critical care was time spent personally by me on the following activities: development of  treatment plan with patient and/or surrogate as well as nursing, discussions with consultants, evaluation of patient's response to treatment, examination of patient, obtaining history from patient or surrogate, ordering and performing treatments and interventions, ordering and review of laboratory studies, ordering and review of radiographic studies, pulse oximetry and re-evaluation of patient's condition.   Cheri Fowler MD Critical care physician [facility]  Pager: [phone] Mobile: [phone]

## 2020-07-14 NOTE — Progress Notes (Signed)
CRITICAL VALUE ALERT  Critical Value:  pH 6.990, pCO2 109  Date & Time Notied:  July 15, 2020@ 03:36  Provider Notified: Pola Corn   Orders Received/Actions taken: TBD

## 2020-07-14 NOTE — Progress Notes (Signed)
Assisted with sisters to make tele-visit via elink, Never viewed family on camera with either visit, these were two separate camera visits

## 2020-07-14 NOTE — Death Summary Note (Signed)
DEATH SUMMARY   Patient Details  Name: Brian Mack MRN: [id number] DOB: 1986-07-18  Admission/Discharge Information   Admit Date:  18-May-2020  Date of Death: Date of Death: 18-Jun-2020  Time of Death: Time of Death: 1840  Length of Stay: 02/14/23  Referring Physician: Patient, No Pcp Per   Reason(s) for Hospitalization  Acute hypoxic/hypercapnic respiratory failure due to ARDS from COVID-19 pneumonia  Acute respiratory acidosis Refractory hyperkalemia Hyponatremia Acute kidney injury Circulatory shock, due to refractory acidosis Hypertriglyceridemia  Diagnoses  Preliminary cause of death:  Secondary Diagnoses (including complications and co-morbidities):  Principal Problem:   Pneumonia due to COVID-19 virus Active Problems:   Class 2 obesity due to excess calories with body mass index (BMI) of 37.0 to 37.9 in adult   AKI (acute kidney injury) (HCC)   Acute hypercapnic respiratory failure (HCC)   ARDS (adult respiratory distress syndrome) (HCC)   Endotracheally intubated   Encephalopathy acute   Brief Hospital Course (including significant findings, care, treatment, and services provided and events leading to death)  Bhargav Barbaro is a 34 y.o. year old male with no known significant medical history (other than obesity, BMI 37.84) presenting with worsening COVID symptoms.  Patient was COVID + on 8/26.  He was seen in the ER on 8/29 for congestion and cough as well as nausea and diarrhea.  O2 sats were 93% with ambulation and he was discharged.  Upon presentation today, sats 77% on RA, improved with 6L NCO2.  Also found to have AKI, given IVF.  He reports that he has been feeling poorly.  His girlfriend is also sick. Patient's condition got worse, he became hypoxic and was intubated on 9/11, he was transferred to ICU.  Due to refractory hyper apnea and hypoxemia he was proned multiple times without any improvement, his blood pressure started dropping he was started on  vasopressors.  Patient was trached on September 26.  He went into fibroproliferative stage of ARDS leading to hypercapnia and acute respiratory acidosis.  Patient's family was contacted he was made DNR and comfort care on 18-Jun-2020 at 6:40 PM he was declared dead.    Pertinent Labs and Studies  Significant Diagnostic Studies CT ABDOMEN PELVIS WO CONTRAST  Result Date: 05/18/2020 CLINICAL DATA:  Acute nonlocalized abdominal pain. Nausea and vomiting. COVID. EXAM: CT ABDOMEN AND PELVIS WITHOUT CONTRAST TECHNIQUE: Multidetector CT imaging of the abdomen and pelvis was performed following the standard protocol without IV contrast. COMPARISON:  None. FINDINGS: Lower chest: Diffuse ground-glass opacities in both lung bases consistent with COVID-19 pneumonia. No pleural fluid. Hepatobiliary: Borderline hepatic steatosis. No evidence of focal hepatic abnormality on noncontrast exam. Suspected intraluminal gallstone without pericholecystic inflammation or gallbladder wall thickening. There is no biliary dilatation. Pancreas: No ductal dilatation or inflammation. Spleen: Normal in size without focal abnormality. Adrenals/Urinary Tract: No adrenal nodule. Mild right hydroureteronephrosis with punctate stone in the mid ureter, series 2, image 61. More distal ureters decompressed. Minimal right perinephric edema. There is no left hydronephrosis or stone. The left ureter is decompressed. Urinary bladder is unremarkable. Stomach/Bowel: Physiologically distended stomach. Normal positioning of the duodenum and ligament of Treitz. Normal no small bowel obstruction or inflammation. There is submucosal fatty infiltration involving the distal ileum and ascending colon suggesting prior or chronic inflammation. No acute inflammatory change. Normal appendix. Administered enteric contrast reaches the colon. There is no colonic wall thickening. No pericolonic fat stranding. Vascular/Lymphatic: Normal caliber abdominal aorta. No aortic  aneurysm. No abdominopelvic adenopathy. Reproductive: Normal sized prostate gland with  left prostatic calcifications. Other: No free air, free fluid, or intra-abdominal fluid collection. Tiny fat containing umbilical hernia. Musculoskeletal: There are no acute or suspicious osseous abnormalities. IMPRESSION: 1. Mild right hydroureteronephrosis with punctate stone in the mid ureter. 2. Diffuse ground-glass opacities in both lung bases consistent with COVID-19 pneumonia. 3. Suspected gallstone without CT findings of acute cholecystitis. 4. Submucosal fatty infiltration involving the distal ileum and ascending colon suggesting prior or chronic inflammation. No acute bowel inflammation. Electronically Signed   By: Narda Rutherford M.D.   On: 05/18/2020 17:36   DG Abd 1 View  Result Date: 05/29/2020 CLINICAL DATA:  OG placement EXAM: ABDOMEN - 1 VIEW; PORTABLE CHEST - 1 VIEW COMPARISON:  May 29, 2020 FINDINGS: Incomplete assessment of the lung bases. OG tube projects over the upper neck and is likely coiled within the mouth. ETT tube tip terminates 3.5 cm above the carina. Coarse bilateral heterogeneous opacities consistent with the sequela of COVID-19 infection. Visualized abdomen is unremarkable. No acute osseous abnormality. IMPRESSION: 1. OG tube projects over the upper neck and is likely coiled within the mouth. Recommend repositioning. 2. Coarse bilateral heterogeneous opacities consistent with the sequela of COVID-19 infection. These results will be called to the ordering clinician or representative by the Radiologist Assistant, and communication documented in the PACS or [employer]. Electronically Signed   By: Meda Klinefelter MD   On: 05/29/2020 13:27   DG Abd 1 View  Result Date: 05/29/2020 CLINICAL DATA:  Nasogastric tube placement EXAM: ABDOMEN - 1 VIEW COMPARISON:  May 29, 2020 study obtained earlier in the day FINDINGS: Nasogastric tube tip and side port are in the stomach. There  is a paucity of bowel gas. No bowel dilatation or air-fluid level evident. No free air appreciable on supine examination. IMPRESSION: Nasogastric tube tip and side port in stomach. Overall paucity of gas may be indicative of a degree of ileus or enteritis. Bowel obstruction less likely. No free air evident supine examination. Electronically Signed   By: Bretta Bang III M.D.   On: 05/29/2020 11:04   DG Abd 1 View  Result Date: 05/29/2020 CLINICAL DATA:  Feeding tube placement EXAM: ABDOMEN - 1 VIEW COMPARISON:  Five days ago FINDINGS: Orogastric tube tip is at the GE junction with side port over the lower esophagus. Covered portions of central line and endotracheal tube are unremarkable. Pulmonary infiltrates. These results will be called to the ordering clinician or representative by the Radiologist Assistant, and communication documented in the PACS or [employer]. IMPRESSION: Short orogastric tube with tip at the GE junction and side port 12 cm above the stomach. Electronically Signed   By: Marnee Spring M.D.   On: 05/29/2020 08:54   DG Chest Port 1 View  Result Date: 06/13/2020 CLINICAL DATA:  Hypoxia EXAM: PORTABLE CHEST 1 VIEW COMPARISON:  Two days ago FINDINGS: Tracheostomy tube in place. Feeding tube which at least reaches the stomach. Left PICC with tip at the upper right atrium. Artifact from EKG leads. Confluent airspace disease on both sides. No visible effusion or pneumothorax. Prominent heart size accentuated by technique. IMPRESSION: Stable hardware positioning and confluent airspace disease. Electronically Signed   By: Marnee Spring M.D.   On: 06/13/2020 06:38   DG CHEST PORT 1 VIEW  Result Date: 06/11/2020 CLINICAL DATA:  ARDS EXAM: PORTABLE CHEST 1 VIEW COMPARISON:  06/11/2020, 06/10/2020, 06/08/2020, 06/02/2020 FINDINGS: Tracheostomy tube remains in place. Esophageal tube tip is below the diaphragm. Left upper extremity central venous catheter tip  over the right atrium.  Extensive left greater than right airspace disease without significant change. Obscured cardiomediastinal silhouette. No pneumothorax. IMPRESSION: No significant interval change in extensive left greater than right airspace disease since exam earlier today. Progression since exams several days ago. Electronically Signed   By: Jasmine Pang M.D.   On: 06/11/2020 21:01   DG CHEST PORT 1 VIEW  Result Date: 06/11/2020 CLINICAL DATA:  Respiratory failure.  COVID EXAM: PORTABLE CHEST 1 VIEW COMPARISON:  06/10/2020 FINDINGS: Tracheostomy tube, left PICC line, and enteric tube are unchanged in position. Shallow inspiration. Dense consolidation throughout both lungs with air bronchograms. Mild progression is suggested since previous study. No pleural effusions. No pneumothorax. Heart size is obscured. IMPRESSION: Dense consolidation throughout both lungs with air bronchograms, mildly progressed since previous study. Electronically Signed   By: Burman Nieves M.D.   On: 06/11/2020 05:12   DG CHEST PORT 1 VIEW  Result Date: 06/10/2020 CLINICAL DATA:  Hypoxia EXAM: PORTABLE CHEST 1 VIEW COMPARISON:  06/08/2020 FINDINGS: Endotracheal tube, enteric tube, and left PICC line are unchanged in position. Shallow inspiration. Diffuse airspace disease throughout both lungs. This is progressing since the previous study. Heart size is obscured but appears mildly enlarged. No pleural effusions. No pneumothorax. IMPRESSION: Progression of diffuse bilateral airspace disease in both lungs. Electronically Signed   By: Burman Nieves M.D.   On: 06/10/2020 22:03   DG Chest Port 1 View  Result Date: 06/08/2020 CLINICAL DATA:  New tracheostomy. EXAM: PORTABLE CHEST 1 VIEW COMPARISON:  Radiograph earlier this day. FINDINGS: New tracheostomy tube in place with tip overlying the thoracic inlet. Previous endotracheal tube has been removed. Weighted enteric tube remains in place with tip below the diaphragm not included in the field of  view. Left upper extremity PICC with tip in the region of the right atrium. No visualized pneumothorax or evidence of pneumomediastinum. Persistent low lung volumes with unchanged heterogeneous bilateral lung opacities. No large pleural effusion. IMPRESSION: 1. New tracheostomy tube in place with tip overlying the thoracic inlet. 2. Persistent low lung volumes with unchanged heterogeneous bilateral lung opacities. Electronically Signed   By: Narda Rutherford M.D.   On: 06/08/2020 18:15   DG CHEST PORT 1 VIEW  Result Date: 06/08/2020 CLINICAL DATA:  Intubation.  COVID pneumonia EXAM: PORTABLE CHEST 1 VIEW COMPARISON:  Six days ago FINDINGS: Endotracheal tube with tip at the clavicular heads. The feeding tube at least reaches the stomach. Left PICC with tip at the right atrium. No change in extensive airspace disease. Stable heart size accentuated by technique. No visible air leak. IMPRESSION: 1. Interval PICC placement which is in good position. 2. Stable infiltrates. Electronically Signed   By: Marnee Spring M.D.   On: 06/08/2020 10:42   DG Chest Port 1 View  Result Date: 06/02/2020 CLINICAL DATA:  Hypoxia EXAM: PORTABLE CHEST 1 VIEW COMPARISON:  June 01, 2020 FINDINGS: Endotracheal tube tip is 4.9 cm above the carina. Feeding tube tip is below the diaphragm. Central catheter tip is at the junction of the left innominate vein and superior vena cava. No pneumothorax. Airspace opacity is noted throughout the mid and lower lung regions, similar to 1 day prior. Heart size and pulmonary vascularity are normal. No adenopathy. No bone lesions. IMPRESSION: Multifocal airspace opacity, similar to 1 day prior. Stable cardiac silhouette. Tube and catheter positions as described without pneumothorax. Electronically Signed   By: Bretta Bang III M.D.   On: 06/02/2020 14:25   DG Chest [facility]  Result Date: 06/01/2020 CLINICAL DATA:  Acute respiratory distress syndrome due to COVID-19. EXAM: PORTABLE  CHEST 1 VIEW COMPARISON:  None. FINDINGS: ET tube tip is above the carina. There is a feeding tube with tip projecting over the gastric antrum. Left IJ catheter tip projects over the SVC. Normal heart size. Bilateral pulmonary interstitial and airspace densities with a lower lung zone predominance is unchanged. IMPRESSION: 1. No change in aeration to the lungs compared with previous exam. 2. Stable support apparatus. Electronically Signed   By: Signa Kell M.D.   On: 06/01/2020 08:59   DG Chest Port 1 View  Result Date: 05/31/2020 CLINICAL DATA:  COVID-19.  Acute respiratory failure. EXAM: PORTABLE CHEST 1 VIEW COMPARISON:  May 30, 2020 FINDINGS: The ETT is in good position. The feeding tube terminates below today's film. A left central line is stable. The cardiomediastinal silhouette is unchanged. Bibasilar pulmonary infiltrates are similar in the interval. No pneumothorax identified. No other changes. IMPRESSION: 1. Support apparatus as above. 2. Bilateral pulmonary infiltrates remain. No evidence of pneumothorax on this study. Electronically Signed   By: Gerome Sam III M.D   On: 05/31/2020 10:13   DG Chest Port 1 View  Result Date: 05/30/2020 CLINICAL DATA:  Shortness of breath, COVID EXAM: PORTABLE CHEST 1 VIEW COMPARISON:  05/29/2020 FINDINGS: Endotracheal tube is unchanged. Interval placement of left central line with the tip in the upper SVC. No pneumothorax. Extensive bilateral airspace disease and low lung volumes. No effusions or pneumothorax. IMPRESSION: Left central line tip in the upper SVC.  No pneumothorax. Extensive bilateral airspace disease and low volumes.  No change. Electronically Signed   By: Charlett Nose M.D.   On: 05/30/2020 06:54   DG CHEST PORT 1 VIEW  Result Date: 05/29/2020 CLINICAL DATA:  OG placement EXAM: ABDOMEN - 1 VIEW; PORTABLE CHEST - 1 VIEW COMPARISON:  May 29, 2020 FINDINGS: Incomplete assessment of the lung bases. OG tube projects over the upper  neck and is likely coiled within the mouth. ETT tube tip terminates 3.5 cm above the carina. Coarse bilateral heterogeneous opacities consistent with the sequela of COVID-19 infection. Visualized abdomen is unremarkable. No acute osseous abnormality. IMPRESSION: 1. OG tube projects over the upper neck and is likely coiled within the mouth. Recommend repositioning. 2. Coarse bilateral heterogeneous opacities consistent with the sequela of COVID-19 infection. These results will be called to the ordering clinician or representative by the Radiologist Assistant, and communication documented in the PACS or [employer]. Electronically Signed   By: Meda Klinefelter MD   On: 05/29/2020 13:27   DG Chest Port 1 View  Result Date: 05/24/2020 CLINICAL DATA:  Hypoxia. EXAM: PORTABLE CHEST 1 VIEW COMPARISON:  May 24, 2020 study obtained earlier in the day FINDINGS: Endotracheal tube tip is 4.5 cm above the carina. Nasogastric tube tip is in the superior vena cava just beyond the junction with the left innominate vein. Nasogastric tube tip and side port in the stomach. No pneumothorax. There is extensive airspace opacity throughout the left lung and right lower lung regions, similar to earlier in the day. Heart is mildly enlarged, stable. Pulmonary vascular is normal. No adenopathy. No bone lesions. IMPRESSION: Tube and catheter positions as described without pneumothorax. Persistent widespread airspace opacity consistent with multifocal pneumonia. There may be a degree of underlying ARDS. Stable cardiac prominence. No adenopathy appreciable by radiography. Electronically Signed   By: Bretta Bang III M.D.   On: 05/24/2020 17:09   DG CHEST PORT 1  VIEW  Result Date: 05/24/2020 CLINICAL DATA:  Intubation, COVID-19 EXAM: PORTABLE CHEST 1 VIEW COMPARISON:  Portable exam 1024 hours compared to 05/24/2020 at 0513 hours FINDINGS: Interval in placement of endotracheal tube with tip projecting 5.2 cm above carina.  New nasogastric tube with tip projecting over stomach. Multiple EKG leads project over chest. Enlargement of cardiac silhouette. Diffuse BILATERAL airspace infiltrates, patchy appearance LEFT greater than RIGHT, favor multifocal pneumonia and consistent with history of COVID-19 . IMPRESSION: Endotracheal and nasogastric tubes in expected positions. Patchy airspace infiltrates bilaterally LEFT greater than RIGHT consistent with multifocal pneumonia and COVID-19 . Electronically Signed   By: Ulyses Southward M.D.   On: 05/24/2020 12:21   DG CHEST PORT 1 VIEW  Result Date: 05/24/2020 CLINICAL DATA:  Hypoxia.  COVID-19 positive EXAM: PORTABLE CHEST 1 VIEW COMPARISON:  May 22, 2020 FINDINGS: Airspace opacity throughout the lungs bilaterally, most notable in the left base, is essentially stable. No new opacity evident. Heart is prominent, stable, with pulmonary vascularity normal. No adenopathy. No bone lesions. IMPRESSION: Multifocal airspace opacity consistent with atypical organism pneumonia persists, similar to 2 days prior. Opacity most severe in left base region, stable. Stable cardiac prominence. No adenopathy evident by radiography. Electronically Signed   By: Bretta Bang III M.D.   On: 05/24/2020 07:54   DG CHEST PORT 1 VIEW  Result Date: 05/22/2020 CLINICAL DATA:  Acute respiratory failure. EXAM: PORTABLE CHEST 1 VIEW COMPARISON:  05/21/2019. FINDINGS: Stable cardiomegaly. Stable diffuse bilateral interstitial infiltrates/edema. Progressive atelectasis and consolidation left lung base. No pleural effusion or pneumothorax. IMPRESSION: 1.  Stable cardiomegaly. 2. Stable diffuse bilateral interstitial infiltrates/edema. Progressive atelectasis and consolidation left lung base. Electronically Signed   By: Maisie Fus  Register   On: 05/22/2020 07:05   DG Chest Port 1 View  Result Date: 05/20/2020 CLINICAL DATA:  Pneumonia.  COVID. EXAM: PORTABLE CHEST 1 VIEW COMPARISON:  Chest x-ray 05/17/2020. FINDINGS:  Cardiomegaly. Progressive severe diffuse bilateral interstitial prominence consistent with pneumonitis. Interstitial edema/CHF cannot be excluded. Small bilateral pleural effusions. No pneumothorax. IMPRESSION: 1.  Cardiomegaly. 2. Progresses severe diffuse bilateral interstitial prominence consistent with pneumonitis. Interstitial edema/CHF cannot be excluded. Small bilateral pleural effusions. Electronically Signed   By: Maisie Fus  Register   On: 05/20/2020 07:25   ECHOCARDIOGRAM LIMITED  Result Date: 05/25/2020    ECHOCARDIOGRAM REPORT   Patient Name:   AGAMJOT KILGALLON Date of Exam: 05/24/2020 Medical Rec #:  [id number]           Height:       69.0 in Accession #:    [id number]          Weight:       240.5 lb Date of Birth:  06-28-1986            BSA:          2.235 m Patient Age:    34 years            BP:           138/73 mmHg Patient Gender: M                   HR:           109 bpm. Exam Location:  Inpatient Procedure: Limited Echo, Cardiac Doppler and Color Doppler Indications:    ARDS (adult respiratory distress syndrome)  History:        Patient has prior history of Echocardiogram examinations, most  recent 05/20/2020. COVID-19.  Sonographer:    Ross Ludwig RDCS (AE) Referring Phys: [id number] Steffanie Dunn  Sonographer Comments: Echo performed with patient supine and on artificial respirator. IMPRESSIONS  1. Left ventricular ejection fraction, by estimation, is 70 to 75%. The left ventricle has hyperdynamic function. The left ventricle has no regional wall motion abnormalities. There is mild left ventricular hypertrophy of the basal-septal segment. Left ventricular diastolic parameters were normal.  2. Right ventricular systolic function is normal. The right ventricular size is normal. Tricuspid regurgitation signal is inadequate for assessing PA pressure.  3. The mitral valve is normal in structure. No evidence of mitral valve regurgitation. No evidence of mitral stenosis.  4. The aortic valve  is normal in structure. Aortic valve regurgitation is not visualized. No aortic stenosis is present. FINDINGS  Left Ventricle: Left ventricular ejection fraction, by estimation, is 70 to 75%. The left ventricle has hyperdynamic function. The left ventricle has no regional wall motion abnormalities. The left ventricular internal cavity size was normal in size. There is mild left ventricular hypertrophy of the basal-septal segment. Left ventricular diastolic parameters were normal. Normal left ventricular filling pressure. Right Ventricle: The right ventricular size is normal. No increase in right ventricular wall thickness. Right ventricular systolic function is normal. Tricuspid regurgitation signal is inadequate for assessing PA pressure. Left Atrium: Left atrial size was normal in size. Right Atrium: Right atrial size was normal in size. Pericardium: There is no evidence of pericardial effusion. Mitral Valve: The mitral valve is normal in structure. No evidence of mitral valve regurgitation. No evidence of mitral valve stenosis. Tricuspid Valve: The tricuspid valve is normal in structure. Tricuspid valve regurgitation is trivial. No evidence of tricuspid stenosis. Aortic Valve: The aortic valve is normal in structure. Aortic valve regurgitation is not visualized. No aortic stenosis is present. Pulmonic Valve: The pulmonic valve was normal in structure. Pulmonic valve regurgitation is not visualized. No evidence of pulmonic stenosis. Aorta: The aortic root is normal in size and structure. Venous: The inferior vena cava was not well visualized. IAS/Shunts: No atrial level shunt detected by color flow Doppler.  LEFT VENTRICLE PLAX 2D LVIDd:         3.80 cm  Diastology LVIDs:         2.40 cm  LV e' medial:    8.59 cm/s LV PW:         1.10 cm  LV E/e' medial:  8.6 LV IVS:        1.30 cm  LV e' lateral:   9.90 cm/s LVOT diam:     2.00 cm  LV E/e' lateral: 7.5 LVOT Area:     3.14 cm  LEFT ATRIUM         Index LA diam:     2.50 cm 1.12 cm/m   AORTA Ao Root diam: 3.20 cm Ao Asc diam:  2.50 cm MITRAL VALVE MV Area (PHT): 4.36 cm    SHUNTS MV Decel Time: 174 msec    Systemic Diam: 2.00 cm MV E velocity: 74.10 cm/s MV A velocity: 74.80 cm/s MV E/A ratio:  0.99 Armanda Magic MD Electronically signed by Armanda Magic MD Signature Date/Time: 05/25/2020/9:08:13 AM    Final    ECHOCARDIOGRAM LIMITED  Result Date: 05/20/2020    ECHOCARDIOGRAM LIMITED REPORT   Patient Name:   FILIMON MIRANDA Date of Exam: 05/20/2020 Medical Rec #:  [id number]           Height:       69.0  in Accession #:    [id number]          Weight:       255.3 lb Date of Birth:  [birth year]            BSA:          2.292 m Patient Age:    34 years            BP:           126/81 mmHg Patient Gender: M                   HR:           74 bpm. Exam Location:  Inpatient Procedure: 2D Echo Indications:   dyspnea 786.09  History:       Patient has no prior history of Echocardiogram examinations.                Covid.  Sonographer:   Delcie Roch Referring      [id number] Phys:  Sonographer Comments: Image acquisition challenging due to respiratory motion. IMPRESSIONS  1. Left ventricular ejection fraction, by estimation, is 60 to 65%. The left ventricle has normal function. The left ventricle has no regional wall motion abnormalities. Left ventricular diastolic function could not be evaluated.  2. Right ventricular systolic function is normal. The right ventricular size is normal. Tricuspid regurgitation signal is inadequate for assessing PA pressure.  3. The mitral valve is normal in structure. No evidence of mitral valve regurgitation. No evidence of mitral stenosis.  4. The aortic valve is normal in structure. Aortic valve regurgitation is not visualized. No aortic stenosis is present. FINDINGS  Left Ventricle: Left ventricular ejection fraction, by estimation, is 60 to 65%. The left ventricle has normal function. The left ventricle has no regional wall motion  abnormalities. The left ventricular internal cavity size was normal in size. There is  no left ventricular hypertrophy. Right Ventricle: The right ventricular size is normal. No increase in right ventricular wall thickness. Right ventricular systolic function is normal. Tricuspid regurgitation signal is inadequate for assessing PA pressure. Left Atrium: Left atrial size was normal in size. Right Atrium: Right atrial size was normal in size. Pericardium: There is no evidence of pericardial effusion. Mitral Valve: The mitral valve is normal in structure. Normal mobility of the mitral valve leaflets. No evidence of mitral valve stenosis. Tricuspid Valve: The tricuspid valve is normal in structure. Tricuspid valve regurgitation is not demonstrated. No evidence of tricuspid stenosis. Aortic Valve: The aortic valve is normal in structure. Aortic valve regurgitation is not visualized. No aortic stenosis is present. Pulmonic Valve: The pulmonic valve was not well visualized. Aorta: The aortic root is normal in size and structure. Venous: The inferior vena cava was not well visualized. IAS/Shunts: No atrial level shunt detected by color flow Doppler. LEFT VENTRICLE PLAX 2D LVIDd:         3.80 cm LVIDs:         2.00 cm LV PW:         0.90 cm LV IVS:        0.90 cm LVOT diam:     2.40 cm LV SV:         85 LV SV Index:   37 LVOT Area:     4.52 cm  LEFT ATRIUM         Index LA diam:    3.10 cm 1.35 cm/m  AORTIC VALVE LVOT Vmax:  92.00 cm/s LVOT Vmean:  63.700 cm/s LVOT VTI:    0.188 m  AORTA Ao Root diam: 3.20 cm MITRAL VALVE MV Area (PHT): 4.21 cm    SHUNTS MV Decel Time: 180 msec    Systemic VTI:  0.19 m MV E velocity: 78.00 cm/s  Systemic Diam: 2.40 cm MV A velocity: 56.10 cm/s MV E/A ratio:  1.39 Armanda Magicraci Turner MD Electronically signed by Armanda Magicraci Turner MD Signature Date/Time: 05/20/2020/6:54:19 PM    Final    US EKG SITE RITE  Result Date: 06/01/2020 If Site Rite image not attached, placement could not be confirmed due to  current cardiac rhythm.   Microbiology Recent Results (from the past 240 hour(s))  Culture, respiratory (non-expectorated)     Status: None   Collection Time: 06/11/20 10:42 AM   Specimen: Tracheal Aspirate; Respiratory  Result Value Ref Range Status   Specimen Description TRACHEAL ASPIRATE  Final   Special Requests NONE  Final   Gram Stain   Final    RARE WBC PRESENT,BOTH PMN AND MONONUCLEAR NO ORGANISMS SEEN    Culture   Final    FEW Normal respiratory flora-no Staph aureus or Pseudomonas seen Performed at [facility], [address] [place], [place] [address]    Report Status 06/13/2020 FINAL  Final  Culture, blood (Routine X 2) w Reflex to ID Panel     Status: None   Collection Time: 06/11/20 11:54 AM   Specimen: BLOOD RIGHT HAND  Result Value Ref Range Status   Specimen Description BLOOD RIGHT HAND  Final   Special Requests   Final    BOTTLES DRAWN AEROBIC ONLY Blood Culture adequate volume   Culture   Final    NO GROWTH 5 DAYS Performed at [facility], [address] [place], [place] [address]    Report Status 06/16/2020 FINAL  Final  Culture, blood (Routine X 2) w Reflex to ID Panel     Status: None   Collection Time: 06/11/20 11:57 AM   Specimen: BLOOD  Result Value Ref Range Status   Specimen Description BLOOD RIGHT ANTECUBITAL  Final   Special Requests   Final    BOTTLES DRAWN AEROBIC ONLY Blood Culture results may not be optimal due to an inadequate volume of blood received in culture bottles   Culture   Final    NO GROWTH 5 DAYS Performed at [facility], [address] [place], [place] [address]    Report Status 06/16/2020 FINAL  Final  Culture, Urine     Status: None   Collection Time: 06/11/20  2:25 PM   Specimen: Urine, Random  Result Value Ref Range Status   Specimen Description URINE, RANDOM  Final   Special Requests NONE  Final   Culture   Final    NO GROWTH Performed at [facility], [address] [place],  [place] [address]    Report Status 06/12/2020 FINAL  Final    Lab Basic Metabolic Panel: Recent Labs  Lab 06/11/20 0310 06/11/20 0310 06/12/20 0309 06/13/20 0408 06/13/20 1448 06/14/20 0433 06/26/2020 0553  NA 141   < > 137 134* 133* 132* 132*  K 4.4   < > 4.6 6.3* 7.5* >7.5* >7.5*  CL 99   < > 97* 95* 94* 90* 94*  CO2 34*   < > 31 29 29 28 24   GLUCOSE 136*   < > 164* 237* 288* 265* 272*  BUN 30*   < >  50* 88* 100* 118* 160*  CREATININE 0.76   < > 1.21 2.21* 2.94* 3.79* 5.51*  CALCIUM 8.2*   < > 8.5* 8.7* 8.5* 8.4* 8.0*  MG 2.0  --   --  2.7*  --   --   --   PHOS  --   --   --  7.4*  --   --   --    < > = values in this interval not displayed.   Liver Function Tests: Recent Labs  Lab 06/11/20 0310 06/14/20 0433  AST 51* 28  ALT 158* 76*  ALKPHOS 365* 265*  BILITOT 0.4 1.0  PROT 5.8* 6.1*  ALBUMIN 2.2* 2.2*   No results for input(s): LIPASE, AMYLASE in the last 168 hours. No results for input(s): AMMONIA in the last 168 hours. CBC: Recent Labs  Lab 06/10/20 0358 06/10/20 0358 06/10/20 2130 06/11/20 0150 06/11/20 0310 06/12/20 0309 06/13/20 0408  WBC 11.8*  --   --   --  12.2* 10.3 10.4  NEUTROABS 7.8*  --   --   --  9.2* 7.2  --   HGB 10.1*   < > 10.1* 10.2* 9.8* 9.3* 8.8*  HCT 34.8*   < > 34.6* 30.0* 33.8* 32.2* 30.7*  MCV 97.2  --   --   --  96.0 99.1 98.7  PLT 213  --   --   --  214 208 248   < > = values in this interval not displayed.   Cardiac Enzymes: No results for input(s): CKTOTAL, CKMB, CKMBINDEX, TROPONINI in the last 168 hours. Sepsis Labs: Recent Labs  Lab 06/10/20 0358 06/11/20 0310 06/12/20 0309 06/13/20 0408  WBC 11.8* 12.2* 10.3 10.4    Procedures/Operations  Tracheostomy   Miesha Bachmann 06/16/2020, 4:10 PM

## 2020-07-14 DEATH — deceased

## 2022-07-20 IMAGING — DX DG CHEST 1V PORT
1 series · 1 of 1 positions shown · non-contrast
Comparison: 06/10/2020

CLINICAL DATA: Respiratory failure.  COVID

EXAM:
PORTABLE CHEST 1 VIEW

[chest ap]
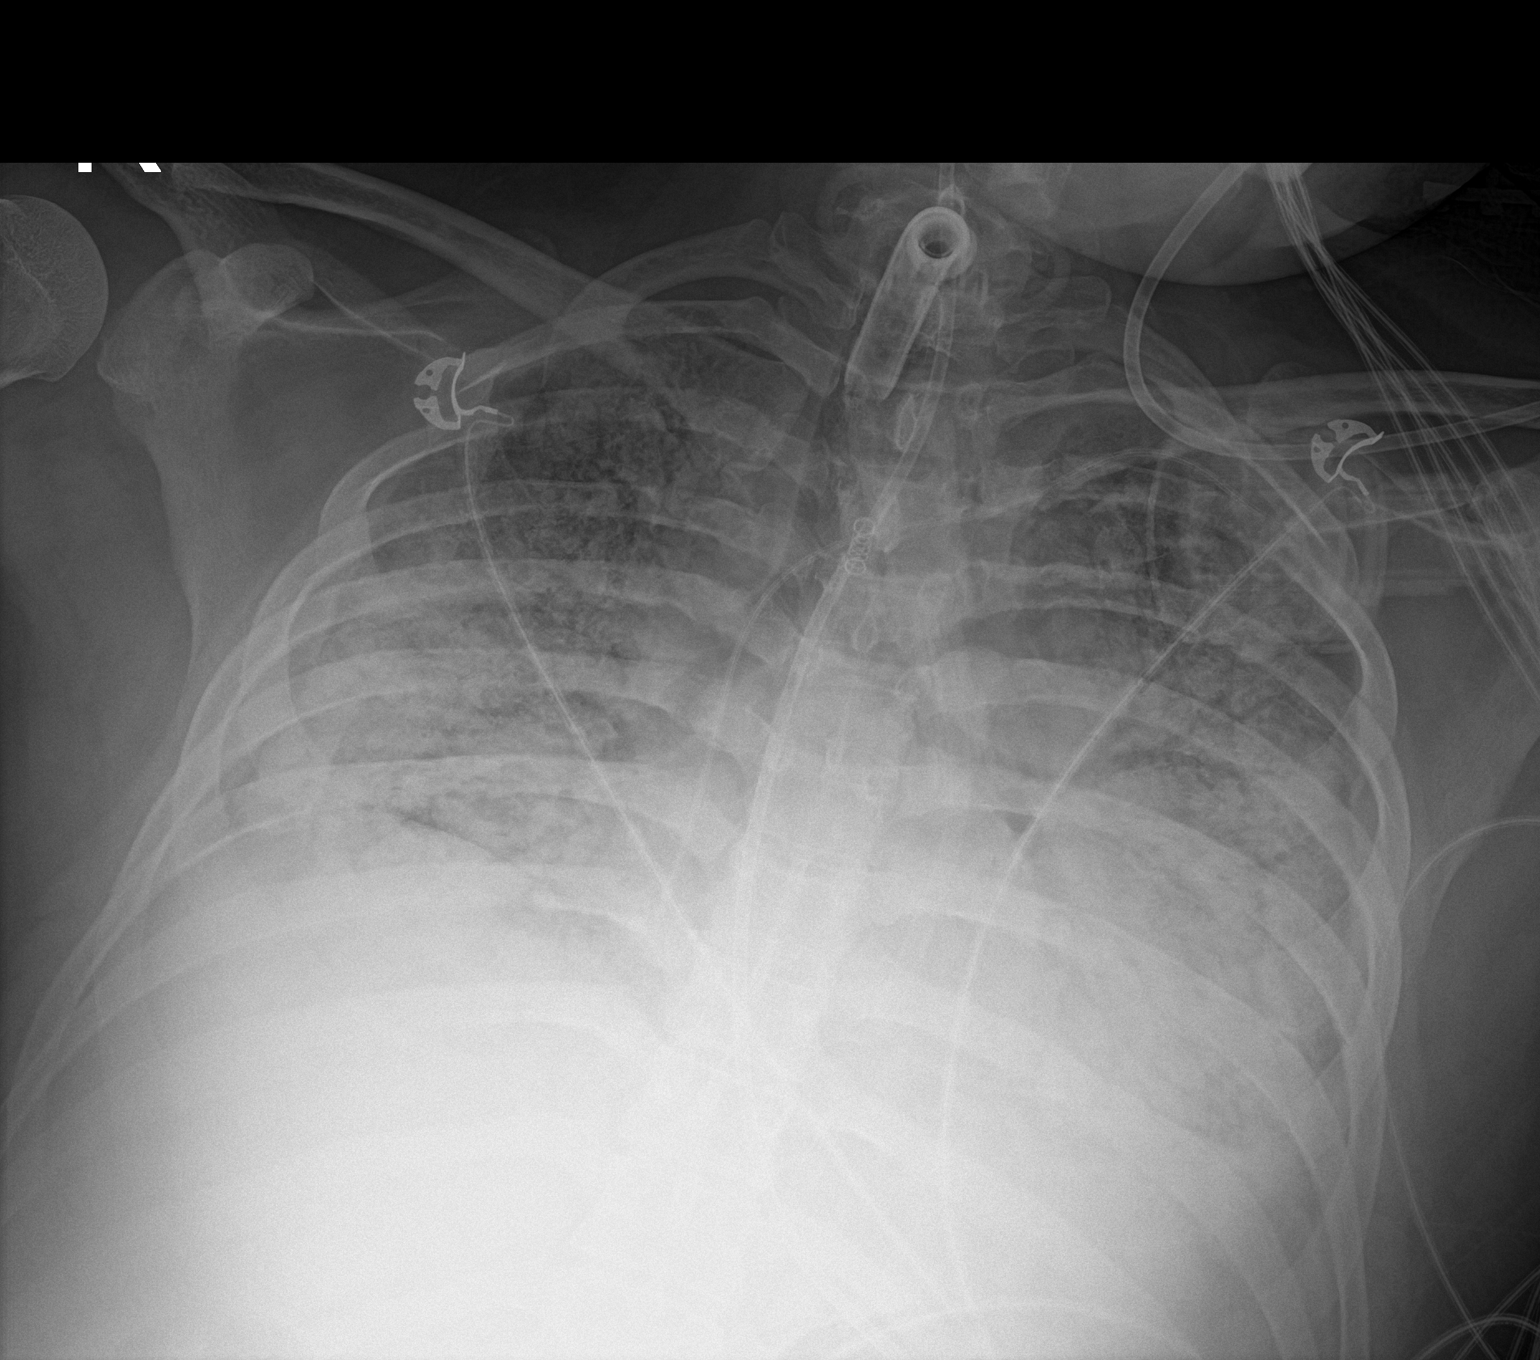

[1 of 1 positions shown; findings below may reference images not displayed]

FINDINGS: Tracheostomy tube, left PICC line, and enteric tube are unchanged in
position. Shallow inspiration. Dense consolidation throughout both
lungs with air bronchograms. Mild progression is suggested since
previous study. No pleural effusions. No pneumothorax. Heart size is
obscured.
IMPRESSION: Dense consolidation throughout both lungs with air bronchograms,
mildly progressed since previous study.

## 2022-07-22 IMAGING — DX DG CHEST 1V PORT
1 series · 1 of 1 positions shown · non-contrast
Comparison: Two days ago

CLINICAL DATA: Hypoxia

EXAM:
PORTABLE CHEST 1 VIEW

[chest ap]
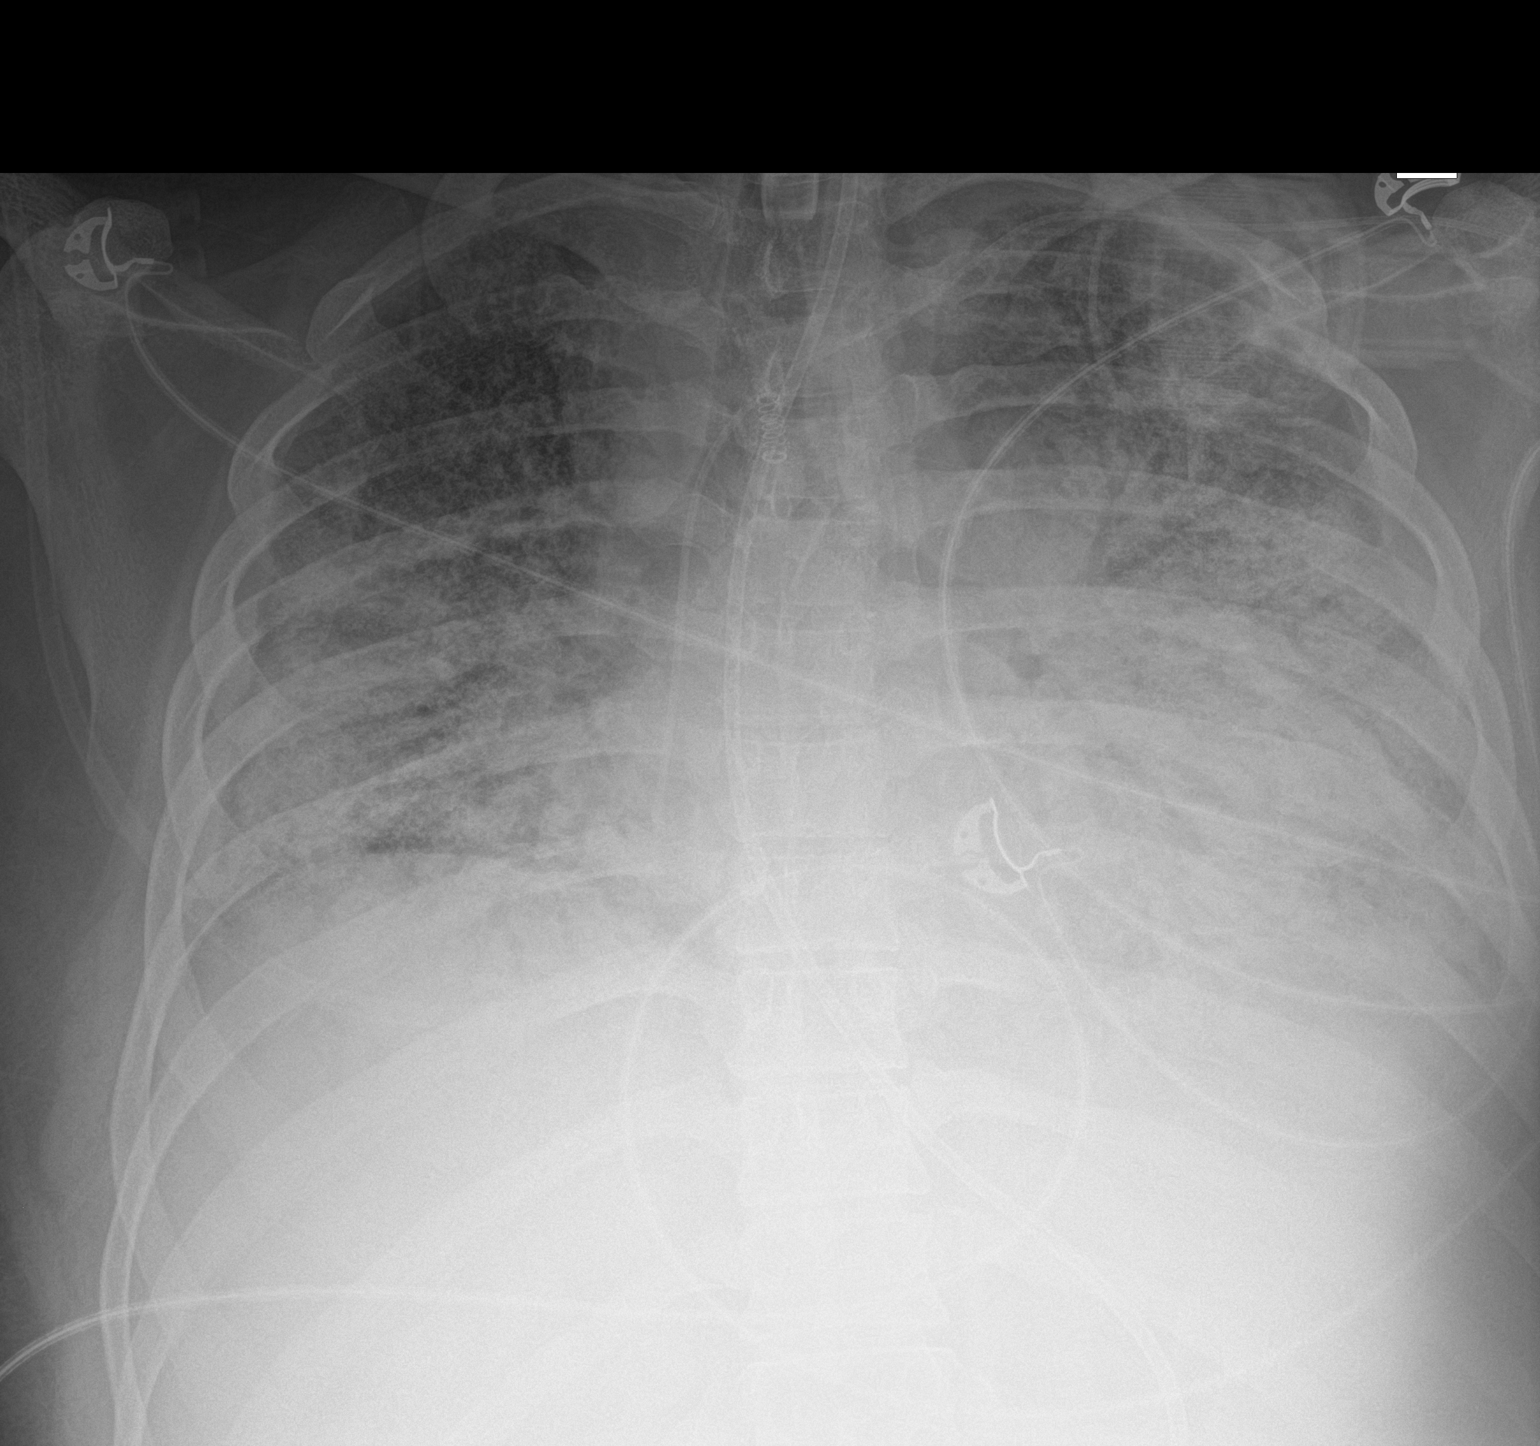

[1 of 1 positions shown; findings below may reference images not displayed]

FINDINGS: Tracheostomy tube in place. Feeding tube which at least reaches the
stomach. Left PICC with tip at the upper right atrium. Artifact from
EKG leads.

Confluent airspace disease on both sides. No visible effusion or
pneumothorax. Prominent heart size accentuated by technique.
IMPRESSION: Stable hardware positioning and confluent airspace disease.
